# Patient Record
Sex: Female | Born: 1940 | Race: Black or African American | Hispanic: No | Smoking: Former smoker
Health system: Southern US, Community
[De-identification: ages and names within clinical notes are randomized; demographics above are authoritative.]

## PROBLEM LIST (undated history)

## (undated) DIAGNOSIS — M069 Rheumatoid arthritis, unspecified: Secondary | ICD-10-CM

## (undated) DIAGNOSIS — M1990 Unspecified osteoarthritis, unspecified site: Secondary | ICD-10-CM

## (undated) DIAGNOSIS — E039 Hypothyroidism, unspecified: Secondary | ICD-10-CM

## (undated) DIAGNOSIS — C541 Malignant neoplasm of endometrium: Secondary | ICD-10-CM

## (undated) DIAGNOSIS — I214 Non-ST elevation (NSTEMI) myocardial infarction: Secondary | ICD-10-CM

## (undated) DIAGNOSIS — I10 Essential (primary) hypertension: Secondary | ICD-10-CM

## (undated) DIAGNOSIS — E079 Disorder of thyroid, unspecified: Secondary | ICD-10-CM

## (undated) HISTORY — DX: Non-ST elevation (NSTEMI) myocardial infarction: I21.4

## (undated) HISTORY — DX: Rheumatoid arthritis, unspecified: M06.9

## (undated) HISTORY — DX: Hypothyroidism, unspecified: E03.9

## (undated) HISTORY — DX: Malignant neoplasm of endometrium: C54.1

---

## 2004-10-19 ENCOUNTER — Ambulatory Visit: Payer: Self-pay | Admitting: Rheumatology

## 2005-06-01 ENCOUNTER — Ambulatory Visit: Payer: Self-pay | Admitting: Family Medicine

## 2006-03-13 ENCOUNTER — Ambulatory Visit: Payer: Self-pay

## 2006-06-07 ENCOUNTER — Ambulatory Visit: Payer: Self-pay

## 2006-12-18 ENCOUNTER — Emergency Department: Payer: Self-pay | Admitting: Emergency Medicine

## 2007-07-05 ENCOUNTER — Ambulatory Visit: Payer: Self-pay | Admitting: Family Medicine

## 2008-08-12 ENCOUNTER — Ambulatory Visit: Payer: Self-pay | Admitting: Family Medicine

## 2009-01-29 ENCOUNTER — Ambulatory Visit: Payer: Self-pay | Admitting: Family Medicine

## 2009-12-16 ENCOUNTER — Ambulatory Visit: Payer: Self-pay | Admitting: Ophthalmology

## 2010-02-03 ENCOUNTER — Ambulatory Visit: Payer: Self-pay | Admitting: Ophthalmology

## 2010-03-17 ENCOUNTER — Ambulatory Visit: Payer: Self-pay | Admitting: Family Medicine

## 2010-08-04 ENCOUNTER — Ambulatory Visit: Payer: Self-pay | Admitting: Ophthalmology

## 2011-11-03 ENCOUNTER — Ambulatory Visit: Payer: Self-pay | Admitting: Primary Care

## 2012-02-19 ENCOUNTER — Emergency Department: Payer: Self-pay | Admitting: Unknown Physician Specialty

## 2012-02-19 LAB — COMPREHENSIVE METABOLIC PANEL
Alkaline Phosphatase: 95 U/L (ref 50–136)
Anion Gap: 5 — ABNORMAL LOW (ref 7–16)
BUN: 14 mg/dL (ref 7–18)
Co2: 27 mmol/L (ref 21–32)
Creatinine: 0.75 mg/dL (ref 0.60–1.30)
EGFR (African American): >60
EGFR (Non-African Amer.): >60
Glucose: 71 mg/dL (ref 65–99)
Potassium: 3.4 mmol/L — ABNORMAL LOW (ref 3.5–5.1)
SGOT(AST): 20 U/L (ref 15–37)
SGPT (ALT): 25 U/L (ref 12–78)
Sodium: 138 mmol/L (ref 136–145)

## 2012-02-19 LAB — TROPONIN I: Troponin-I: <0.02 ng/mL

## 2012-02-19 LAB — PRO B NATRIURETIC PEPTIDE: B-Type Natriuretic Peptide: 77 pg/mL (ref 0–125)

## 2012-02-19 LAB — CBC
HCT: 41 % (ref 35.0–47.0)
Platelet: 266 10*3/uL (ref 150–440)
RBC: 4.24 10*6/uL (ref 3.80–5.20)

## 2012-02-29 ENCOUNTER — Emergency Department: Payer: Self-pay | Admitting: Emergency Medicine

## 2012-02-29 LAB — BASIC METABOLIC PANEL
Anion Gap: 2 — ABNORMAL LOW (ref 7–16)
Chloride: 103 mmol/L (ref 98–107)
Co2: 33 mmol/L — ABNORMAL HIGH (ref 21–32)
Creatinine: 1.01 mg/dL (ref 0.60–1.30)
EGFR (Non-African Amer.): 56 — ABNORMAL LOW
Osmolality: 278 (ref 275–301)
Potassium: 3.5 mmol/L (ref 3.5–5.1)

## 2012-02-29 LAB — CBC
HGB: 13.4 g/dL (ref 12.0–16.0)
MCH: 32.8 pg (ref 26.0–34.0)
MCV: 97 fL (ref 80–100)
RDW: 13.9 % (ref 11.5–14.5)

## 2012-02-29 LAB — CK TOTAL AND CKMB (NOT AT ARMC)
CK, Total: 134 U/L (ref 21–215)
CK-MB: 1.4 ng/mL (ref 0.5–3.6)

## 2012-03-06 LAB — TROPONIN I: Troponin-I: <0.02 ng/mL

## 2012-03-06 LAB — CBC
HGB: 13.9 g/dL (ref 12.0–16.0)
Platelet: 276 10*3/uL (ref 150–440)
RBC: 4.2 10*6/uL (ref 3.80–5.20)
WBC: 7.3 10*3/uL (ref 3.6–11.0)

## 2012-03-06 LAB — CK TOTAL AND CKMB (NOT AT ARMC): CK-MB: 2.1 ng/mL (ref 0.5–3.6)

## 2012-03-06 LAB — BASIC METABOLIC PANEL
Anion Gap: 5 — ABNORMAL LOW (ref 7–16)
Calcium, Total: 9.8 mg/dL (ref 8.5–10.1)
Chloride: 103 mmol/L (ref 98–107)
Co2: 30 mmol/L (ref 21–32)
Glucose: 87 mg/dL (ref 65–99)
Osmolality: 277 (ref 275–301)
Sodium: 138 mmol/L (ref 136–145)

## 2012-03-07 ENCOUNTER — Observation Stay: Payer: Self-pay | Admitting: Internal Medicine

## 2012-03-07 LAB — TROPONIN I
Troponin-I: <0.02 ng/mL
Troponin-I: <0.02 ng/mL

## 2012-03-07 LAB — CK-MB: CK-MB: 1 ng/mL (ref 0.5–3.6)

## 2012-11-21 ENCOUNTER — Ambulatory Visit: Payer: Self-pay | Admitting: Primary Care

## 2013-05-02 ENCOUNTER — Ambulatory Visit: Payer: Self-pay | Admitting: Ophthalmology

## 2013-05-02 LAB — POTASSIUM: Potassium: 3.5 mmol/L (ref 3.5–5.1)

## 2013-05-15 ENCOUNTER — Ambulatory Visit: Payer: Self-pay | Admitting: Ophthalmology

## 2013-08-14 DIAGNOSIS — M1990 Unspecified osteoarthritis, unspecified site: Secondary | ICD-10-CM | POA: Insufficient documentation

## 2014-05-16 NOTE — Discharge Summary (Signed)
PATIENT NAME:  Kimberly Burke, Kimberly Burke MR#:  [id number] DATE OF BIRTH:  [birth year]  DATE OF ADMISSION:  03/07/2012 DATE OF DISCHARGE:  03/07/2012  PRIMARY CARE PHYSICIAN: Emmaline Kluver., MD [facility] in [place].  DISCHARGE DIAGNOSES: Chest pain, mostly muscular, rheumatoid arthritis, hypertension and hypothyroidism.   HISTORY OF PRESENT ILLNESS: The patient is a 74 year old female with past medical history of rheumatoid arthritis, hypothyroidism, hypertension, who presented with complaint of chest pain. The patient reported that chest pain has been going on for a few weeks now in non-typical quality; already presented to the Emergency Room 2 times in the past with similar complaint. The patient reported that her pain is intermittent; waxing and waning and started when she was ironing some clothes in the left lower breast area, started from back and radiated to the front; sharp quality; worsened by deep inspiration and movement and by palpation. The patient presented to the Emergency Room on 01/26 also with significant pain where she was negative troponin. No EKG changes. She presented on 02/05 also. She had elevated d-dimer. CT chest with IV contrast was done, which was negative and did show right basilar air space disease, which might be either infiltrative. She was discharged on p.o. antibiotics from the Emergency Department. This time when the patient came to the Emergency Room, she had the pain, which improved with morphine. She was given aspirin 324 mg and she was admitted to rule out any coronary event. She did not have any other complaint with the pain.   HOSPITAL COURSE AND STAY: Troponins were followed x 3 and they were negative, less than 0.02. Echocardiogram was done, which was normal. LVF, mild MR and mild TR. Her pain remained under control with pain medication and some muscle relaxants. She was feeling better, so she was discharged home with advice to follow up with primary care  physician, and if the pain persists, then she might need to see a cardiologist. Other medical issues addressed during the hospital stay:  1.  Hypertension. We continued on home medications and blood pressure remained stable.  2.  Hypothyroidism. We continued on Synthroid.  3.  Rheumatoid arthritis. Continued to follow with her outpatient hematologist.  4.  Deep vein thrombosis prophylaxis with subcutaneous Heparin.   IMPORTANT LABORATORY RESULTS: Grossly negative. Troponin x 3 negative. Echocardiogram as discussed above, left ventricular function normal.   CONDITION ON DISCHARGE: Stable.   CODE STATUS: Full code.   DISCHARGE MEDICATIONS: Atenolol 100 mg oral tablet once a day, hydrochlorothiazide 25 mg once a day, Remicade 100 mg intravenous injection once every 3 months, levothyroxine 50 mcg oral tablet once a day, folic acid 1 mg oral tablet once a day, methotrexate 2.5 mg oral tablet 3 orally once a week, cyclobenzaprine 10 mg oral tablet 2 times a day for 15 days as needed for muscle spasm, meclizine 250 mg oral tablet 2 times a day for 15 days and pantoprazole 40 mg oral delayed-release tablet once a day.   HOME OXYGEN: No.  DIET ON DISCHARGE: Low sodium, low fat, low cholesterol diet. Consistency regular.  ACTIVITY LIMITATION: None.   TIME FRAME TO FOLLOWUP: Within 1 to 2 weeks with primary care physician.  TOTAL TIME SPENT ON THIS DISCHARGE: 45 minutes.    ____________________________ Ceasar Lund Anselm Jungling, MD vgv:aw D: 03/11/2012 14:00:51 ET T: 03/12/2012 08:45:55 ET JOB#: [id number]  cc: Ceasar Lund. Anselm Jungling, MD, <Dictator> Emmaline Kluver., MD Vaughan Basta MD ELECTRONICALLY SIGNED 03/16/2012 18:19

## 2014-05-16 NOTE — H&P (Signed)
PATIENT NAME:  Kimberly Burke, RECENDIZ MR#:  [id number] DATE OF BIRTH:  [birth year]  DATE OF ADMISSION:  03/07/2012  REFERRING PHYSICIAN: Dr. Marta Antu.   PRIMARY CARE PHYSICIAN: [facility].   PRIMARY RHEUMATOLOGIST: Dr. Cristi Loron.   CHIEF COMPLAINT: Chest pain.   HISTORY OF PRESENT ILLNESS: This is a 74 year old female with past medical history of rheumatoid arthritis, hypothyroidism, hypertension, presents with complaints of chest pain. The patient reports chest pain has been going on for a few weeks, nontypical quality. Already presented to the ED 2 times with similar complaints. The patient reports her chest pain is intermittent, waxing and waning, started today while she was ironing some clothes. Reports it is in the left lower breast area. It starts from the back and radiates to the front, sharp quality, worsened by deep inspiration and movement, as well as by palpation. Reports this is typical for the chest pain when she presented over the last 2 weeks to the ED. The patient presented on January 26th with similar pain where she had negative troponins, no EKG changes. As well, presented February 5th, before six days, where she had elevated D-dimers, where she had CT chest with IV contrast which came out negative for PE, but it did show right basilar airspace disease which might represent atelectasis versus developing infiltrate, where she was discharged on p.o. antibiotics from the ED. The patient reports her chest pain improved today when she was given morphine. The patient was given 324 of aspirin in the ED. Hospitalists were requested to admit the patient for further workup of this chest pain. The patient, as mentioned earlier, describes the quality of the pain as sharp without any radiation. She denies any nausea, vomiting, sweating. Denies any shortness of breath, palpitations, weakness, dizziness or cough.   ALLERGIES: CODEINE CAUSING RASH.   HOME MEDICATIONS:  1.  Hydrochlorothiazide 25 oral daily.  2. Levothyroxine 50 mcg oral daily.  3. Atenolol 100 mg oral daily.  4. Methotrexate sodium 2.5 mg 3 tablets every Wednesday.  5. Remicade 100 mg IV infusion every 8 weeks. Last received on January 12, 2012.  6. Folic acid 1 mg oral daily.  7. Flexeril as needed.   PAST MEDICAL HISTORY:  1. Hypertension.  2. Kidney stone.  3. Migraines.  4. Rheumatid arthritis.  5. Hypothyroidism.  6. History of hepatic and renal cysts.   PAST SURGICAL HISTORY: None.   SOCIAL HISTORY: The patient drinks alcohol occasionally. No history of smoking or illicit drug use.   FAMILY HISTORY: Significant for hypertension, CVA, diabetes in the family and her sister had history of brain tumor.   REVIEW OF SYSTEMS:  GENERAL: The patient denies any fever, chills, fatigue or weakness.  EYES: Denies blurry vision, double vision, pain, inflammation, glaucoma, cataracts.  EARS, NOSE, THROAT: Denies tinnitus, ear pain, hearing loss, allergies, discharge, snoring, epistaxis or postnasal drip.  RESPIRATORY: Denies any cough, wheezing, hemoptysis, dyspnea. Has painful deep inspiration on the left side. Denies any COPD.   CARDIOVASCULAR: Has nontypical chest pain left-sided, reproducible by palpation, deep inspiration and movement. Denies any edema, arrhythmia, palpitations, syncope, dyspnea on exertion.  GASTROINTESTINAL: Denies any nausea, vomiting, diarrhea, abdominal pain, hematemesis, melena, rectal bleed, hemorrhoids, constipation or diarrhea. Complains of indigestion.  GENITOURINARY: Denies dysuria, hematuria, renal colic.  ENDOCRINE: Denies polyuria, polydipsia, heat or cold intolerance. Has hypothyroidism.  HEMATOLOGY: Denies anemia, easy bruising, bleeding diathesis.  INTEGUMENTARY: Denies acne, rash or skin lesions.  MUSCULOSKELETAL: Denies any cramps, gout, limited activity or redness.  NEUROLOGIC: Denies dysarthria, epilepsy, tremors, vertigo, ataxia, dementia, headache,  migraine, CVA, TIA.   PSYCHIATRIC: Denies anxiety, insomnia, bipolar disorder, depression, substance or alcohol abuse.   PHYSICAL EXAM:  VITAL SIGNS: Temperature 97.5, pulse 61, respiratory rate 20, blood pressure 126/64, saturating 97% on oxygen.  GENERAL: Well-nourished female who looks comfortable in bed, in no apparent distress.  HEAD, EYES, EARS, NOSE, THROAT: Head atraumatic, normocephalic. Pupils equal, reactive to light. Pink conjunctivae. Anicteric sclerae. Moist oral mucosa.  NECK: Supple. No thyromegaly. No JVD.  CHEST: Has good air entry bilaterally. No wheezing, rales or rhonchi. Has reproducible chest pain to palpation in the rib cage area and the left lower chest area.  CARDIOVASCULAR: S1, S2 heard. No rubs, murmurs or gallops.  ABDOMEN: Soft, nontender, nondistended. Bowel sounds present.  EXTREMITIES: No edema. No clubbing. No cyanosis. Has some distal hypertrophic changes.  NEUROLOGIC: Cranial nerves grossly intact. Motor 5 out of 5.  SKIN: Normal skin turgor. Warm and dry.  PSYCHIATRIC: Pleasant, awake, alert x3. Intact judgment and insight.   PERTINENT LABS: Glucose 87, BUN 19, creatinine 0.92, sodium 138, potassium 3.8, chloride 103, CO2 30. Troponin less than 0.02. White blood cells 7.3, hemoglobin 13.9, hematocrit 41, platelets 276. EKG showing normal sinus rhythm with poor progression of R wave from V2 to V3, but this is old when compared to the previous EKGs in early February and January.   ASSESSMENT AND PLAN:  1. Chest pain: It is nontypical chest pain. This is most likely musculoskeletal costochondritis as it is reproducible by palpation, worsened with deep inspiratory effort and with movement. The patient will be started on naproxen as well as Protonix for gastrointestinal prophylaxis, as well as on p.r.n. morphine as her pain is significant. Meanwhile, she will be admitted with telemonitor. Will continue to cycle her troponins to rule out any cardiac origin of her  chest pain or any ischemia even though it seems unlikely. Given the patient (Dictation Anomaly) <MISSING TEXT>> , will check a 2D echocardiogram to rule out any pericardial effusion or pericarditis. The patient had a pulmonary embolism ruled out last week when she had CT chest with intravenous contrast which did not show any evidence for pulmonary embolism.  2. Hypertension: Will continue the patient on her home medication.  3. Hypothyroidism: Continue with Synthroid.  4. Rheumatoid arthritis: Seems to be stable at this point. The patient to continue to follow with her outpatient rheumatologist.  5. Deep vein thrombosis prophylaxis: Subcutaneous heparin.  6. Gastrointestinal prophylaxis: On Protonix.   CODE STATUS: Full code.   TOTAL TIME SPENT ON ADMISSION AND PATIENT CARE: 55 minutes.    ____________________________ Albertine Patricia, MD dse:gb D: 03/07/2012 01:37:01 ET T: 03/07/2012 01:58:28 ET JOB#: [id number]  cc: Albertine Patricia, MD, <Dictator> Corrine Tillis Graciela Husbands MD ELECTRONICALLY SIGNED 03/10/2012 4:56

## 2014-05-17 NOTE — Op Note (Signed)
PATIENT NAME:  Kimberly Burke, Kimberly Burke MR#:  [id number] DATE OF BIRTH:  [birth year] ACCOUNT #: [id number]  DATE OF PROCEDURE:  05/15/2013  PROCEDURES PERFORMED:   1. Pars plana vitrectomy of the right eye.  2. Internal limiting membrane peel of the right eye.  3. Endolaser.  PREOPERATIVE DIAGNOSIS: Full-thickness macular hole.   POSTOPERATIVE DIAGNOSIS: Full-thickness macular hole.   ESTIMATED BLOOD LOSS: Less than 1 mL.  PRIMARY SURGEON: Kimberly Burke. Kimberly Nettle, MD  ANESTHESIA: Retrobulbar block of the right eye with monitored anesthesia care.   COMPLICATIONS: None.   INDICATIONS FOR PROCEDURE: The patient presented to my office with decreased vision in the right eye. Examination revealed a full-thickness macular hole. Risks, benefits and alternatives of the above procedure were discussed, and the patient wished to proceed.   DETAILS: After informed consent was obtained, the patient was brought to the operative suite at [facility]. The patient was placed in supine position and was given a small dose of Alfenta. A retrobulbar block was performed on the right eye by the primary surgeon without any complications. The right eye was prepped and draped in sterile manner. After lid speculum was inserted, a 25-gauge trocar was placed inferotemporally through displaced conjunctiva in an oblique fashion 3 mm beyond the limbus. Infusion cannula was turned on and inserted through the trocar and secured into position with Steri-Strips. Two more trocars were placed in a similar fashion superotemporally and superonasally. The vitreous cutter and light pipe were introduced into the eye, and a core vitrectomy was performed. The vitreous face was engaged using suction and elevated away from the retina without any complications. Vitreous face was removed, and the peripheral vitreous was trimmed for 360 degrees. Indocyanine green was injected onto the posterior pole and removed within 30 seconds. A  complete internal limiting membrane peel was performed for 360 degrees around the fovea without complication. Total diameter of approximately 2 disk diameters. Scleral depressed exam was performed, and there were multiple areas of lattice with atrophic holes inferiorly. Endolaser was introduced, and 4 rows of laser were placed around each of these areas. No other signs of any breaks, tears or retinal detachment could be identified for 360 degrees. A complete air-fluid exchange was performed. Four minutes was allowed to elapse for dehydration of the vitreous base, and this fluid was removed. The 20% SF6 was used as an Acupuncturist, and the trocars were removed. The wounds were noted to be airtight, and pressure in the eye was confirmed to be approximately 15 mmHg. Dexamethasone 5 mg was given into the inferior fornix. The lid speculum was removed, and the eye was cleaned. TobraDex was placed on the eye, and a patch and shield were placed over the eye. The patient was taken to postanesthesia care with instruction to remain face down.    ____________________________ Kimberly Burke. Kimberly Manns, MD mfa:lb D: 05/15/2013 08:26:10 ET T: 05/15/2013 08:40:30 ET JOB#: [id number]  cc: Kimberly Burke. Kimberly Manns, MD, <Dictator> Coralee Rud MD ELECTRONICALLY SIGNED 06/19/2013 9:32

## 2014-05-21 IMAGING — CR DG CHEST 2V
1 series · 2 of 2 positions shown · non-contrast
Comparison: none

REASON FOR EXAM: sob with thoracic pain
COMMENTS:

[Series 2: w chest pa · 0.14mm/px · 2 of 2 slices shown]
[im 1/2]
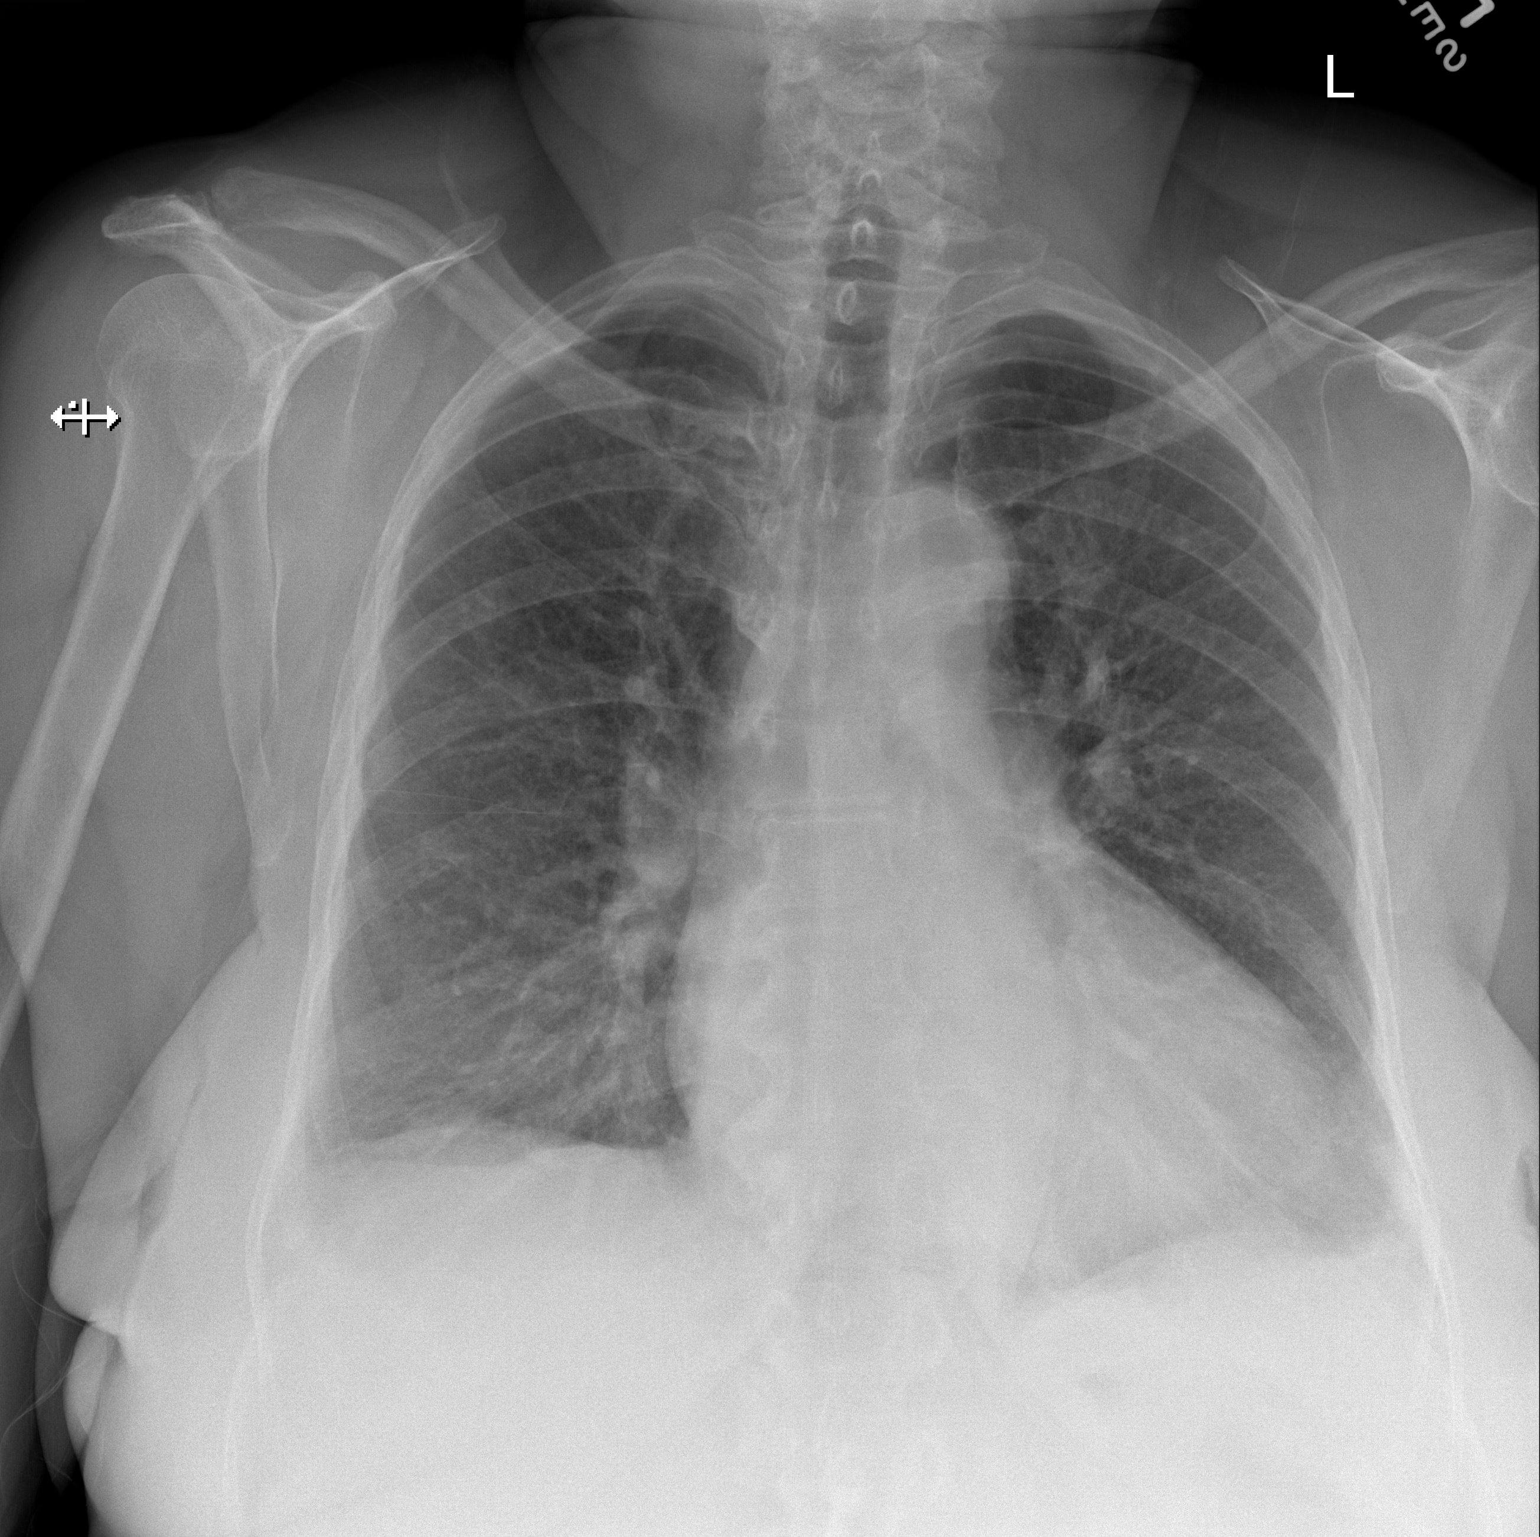
[im 2/2]
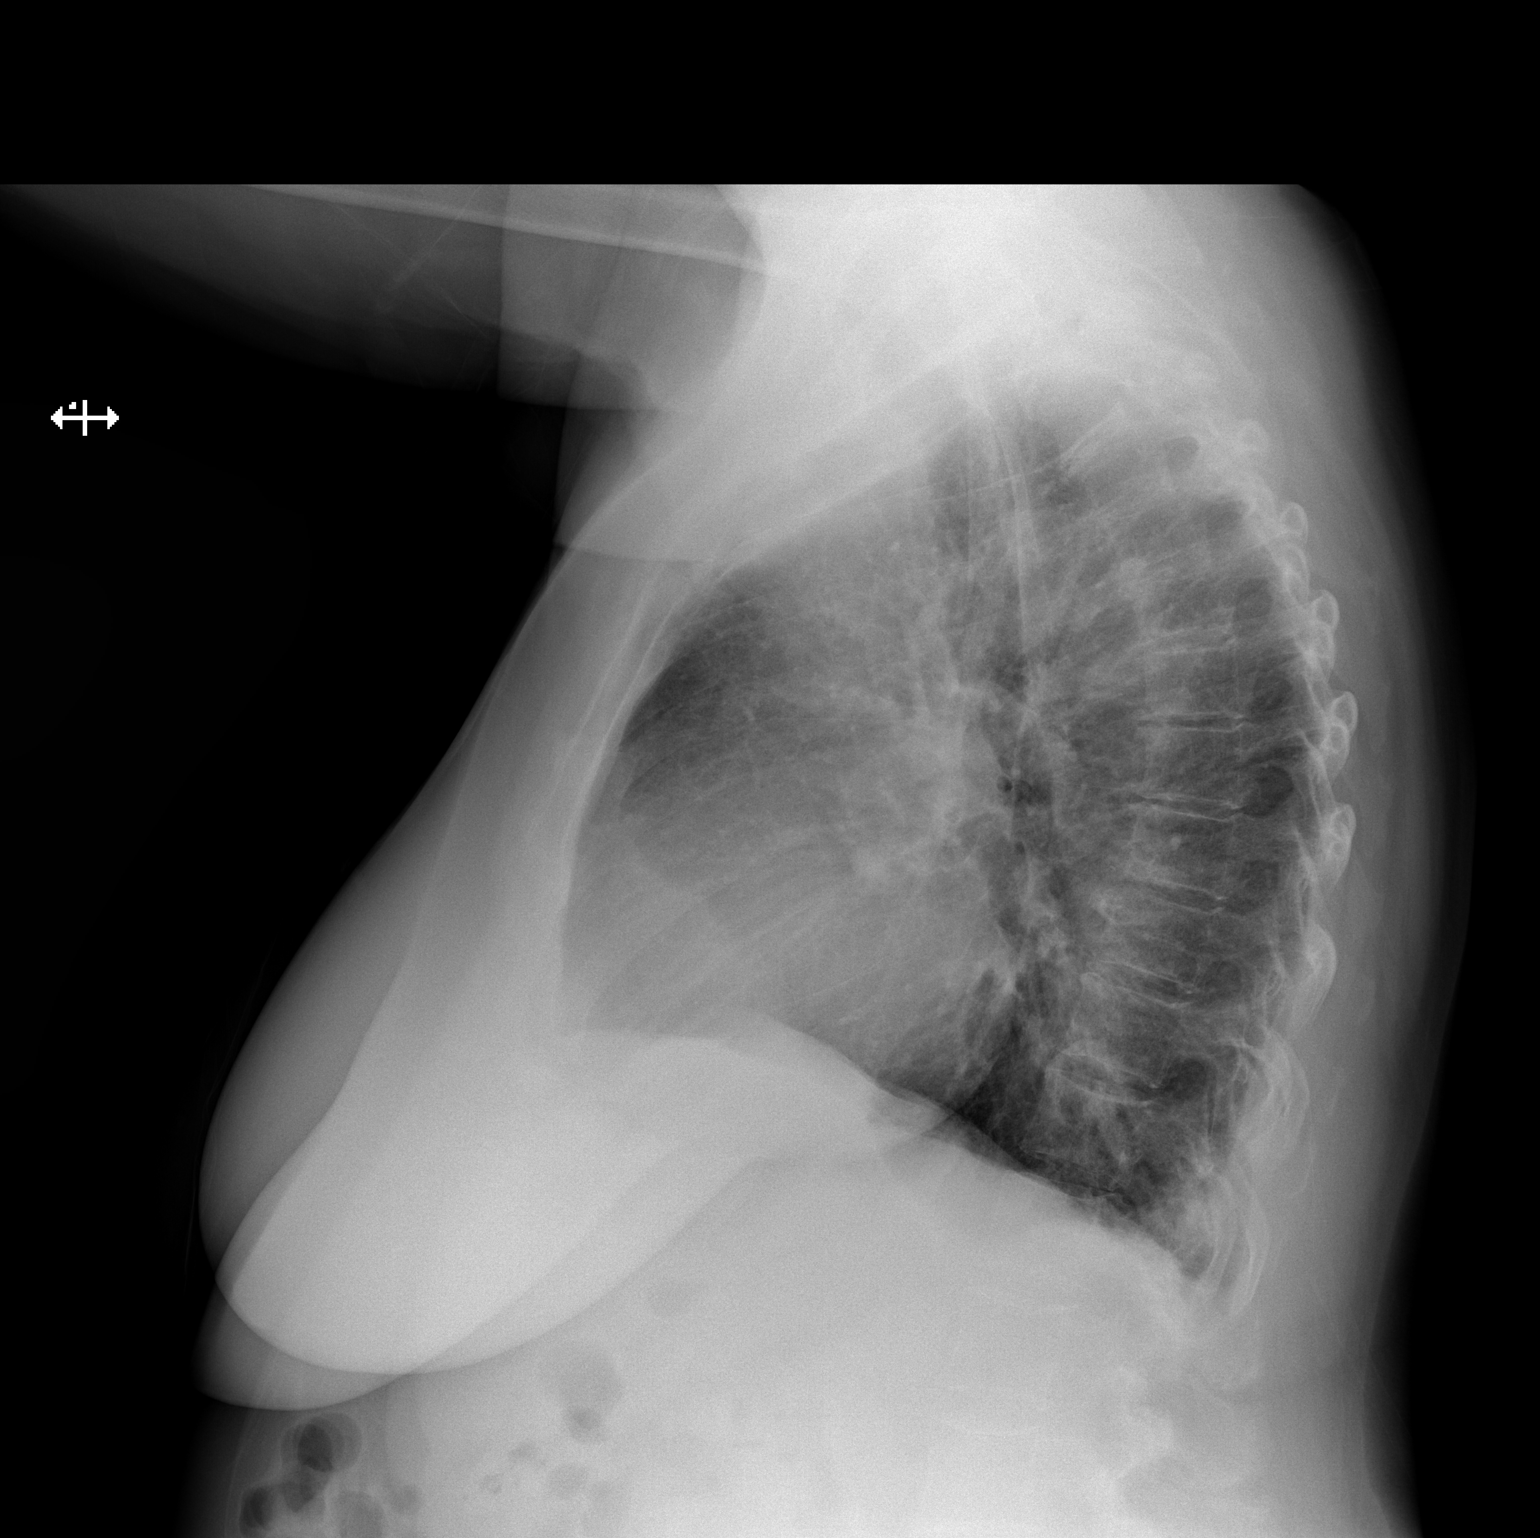

[2 of 2 positions shown; findings below may reference images not displayed]

PROCEDURE:     DXR - DXR CHEST PA (OR AP) AND LATERAL  - February 19, 2012  [DATE]

RESULT:     There is no previous exam for comparison.

There is some increased density in the retrocardiac region of the left lower
lobe suggestive of atelectasis or early infiltrate. The heart is borderline
enlarged. There is no edema, effusion or pneumothorax.
IMPRESSION: Minimal retrocardiac left lung base atelectasis versus
early infiltrate. Borderline cardiomegaly.

[REDACTED]

## 2014-10-27 ENCOUNTER — Other Ambulatory Visit: Payer: Self-pay | Admitting: Physician Assistant

## 2014-10-27 DIAGNOSIS — Z1231 Encounter for screening mammogram for malignant neoplasm of breast: Secondary | ICD-10-CM

## 2014-11-06 ENCOUNTER — Ambulatory Visit: Payer: Self-pay | Attending: Physician Assistant

## 2015-05-22 ENCOUNTER — Other Ambulatory Visit: Payer: Self-pay | Admitting: Physician Assistant

## 2015-05-22 DIAGNOSIS — I8393 Asymptomatic varicose veins of bilateral lower extremities: Secondary | ICD-10-CM

## 2015-06-03 ENCOUNTER — Ambulatory Visit
Admission: RE | Admit: 2015-06-03 | Discharge: 2015-06-03 | Disposition: A | Discharge status: Home / Self Care | Payer: Medicare Other | Source: Ambulatory Visit | Attending: Physician Assistant | Admitting: Physician Assistant

## 2015-06-03 ENCOUNTER — Other Ambulatory Visit: Payer: Self-pay | Admitting: Physician Assistant

## 2015-06-03 DIAGNOSIS — M8580 Other specified disorders of bone density and structure, unspecified site: Secondary | ICD-10-CM | POA: Insufficient documentation

## 2015-06-03 DIAGNOSIS — Z139 Encounter for screening, unspecified: Secondary | ICD-10-CM

## 2015-06-03 DIAGNOSIS — Z1231 Encounter for screening mammogram for malignant neoplasm of breast: Secondary | ICD-10-CM | POA: Diagnosis present

## 2015-06-03 DIAGNOSIS — I8393 Asymptomatic varicose veins of bilateral lower extremities: Secondary | ICD-10-CM

## 2015-06-03 DIAGNOSIS — Z13820 Encounter for screening for osteoporosis: Secondary | ICD-10-CM | POA: Diagnosis present

## 2016-11-28 ENCOUNTER — Other Ambulatory Visit: Payer: Self-pay | Admitting: Physician Assistant

## 2016-11-28 DIAGNOSIS — Z1231 Encounter for screening mammogram for malignant neoplasm of breast: Secondary | ICD-10-CM

## 2016-12-13 ENCOUNTER — Ambulatory Visit
Admission: RE | Admit: 2016-12-13 | Discharge: 2016-12-13 | Disposition: A | Discharge status: Home / Self Care | Payer: Medicare Other | Source: Ambulatory Visit | Attending: Physician Assistant | Admitting: Physician Assistant

## 2016-12-13 DIAGNOSIS — Z1231 Encounter for screening mammogram for malignant neoplasm of breast: Secondary | ICD-10-CM | POA: Insufficient documentation

## 2017-07-02 ENCOUNTER — Emergency Department: Payer: Medicare Other

## 2017-07-02 ENCOUNTER — Emergency Department
Admission: EM | Admit: 2017-07-02 | Discharge: 2017-07-03 | Disposition: A | Discharge status: Home / Self Care | Payer: Medicare Other | Attending: Emergency Medicine | Admitting: Emergency Medicine

## 2017-07-02 ENCOUNTER — Other Ambulatory Visit: Payer: Self-pay

## 2017-07-02 DIAGNOSIS — I129 Hypertensive chronic kidney disease with stage 1 through stage 4 chronic kidney disease, or unspecified chronic kidney disease: Secondary | ICD-10-CM | POA: Insufficient documentation

## 2017-07-02 DIAGNOSIS — Z79899 Other long term (current) drug therapy: Secondary | ICD-10-CM | POA: Insufficient documentation

## 2017-07-02 DIAGNOSIS — N189 Chronic kidney disease, unspecified: Secondary | ICD-10-CM | POA: Diagnosis not present

## 2017-07-02 DIAGNOSIS — R609 Edema, unspecified: Secondary | ICD-10-CM | POA: Insufficient documentation

## 2017-07-02 DIAGNOSIS — R0989 Other specified symptoms and signs involving the circulatory and respiratory systems: Secondary | ICD-10-CM | POA: Diagnosis not present

## 2017-07-02 DIAGNOSIS — R079 Chest pain, unspecified: Secondary | ICD-10-CM | POA: Diagnosis not present

## 2017-07-02 LAB — CBC
HEMATOCRIT: 39.3 % (ref 35.0–47.0)
Hemoglobin: 13.5 g/dL (ref 12.0–16.0)
MCH: 33.1 pg (ref 26.0–34.0)
MCHC: 34.4 g/dL (ref 32.0–36.0)
MCV: 96.3 fL (ref 80.0–100.0)
Platelets: 259 10*3/uL (ref 150–440)
RBC: 4.08 MIL/uL (ref 3.80–5.20)
RDW: 14.5 % (ref 11.5–14.5)
WBC: 8.3 10*3/uL (ref 3.6–11.0)

## 2017-07-02 LAB — BASIC METABOLIC PANEL
Anion gap: 8 (ref 5–15)
BUN: 16 mg/dL (ref 6–20)
CHLORIDE: 99 mmol/L — AB (ref 101–111)
CO2: 29 mmol/L (ref 22–32)
Calcium: 10.1 mg/dL (ref 8.9–10.3)
Creatinine, Ser: 0.86 mg/dL (ref 0.44–1.00)
GFR calc Af Amer: >60 mL/min (ref >60)
GFR calc non Af Amer: >60 mL/min (ref >60)
GLUCOSE: 98 mg/dL (ref 65–99)
POTASSIUM: 3.1 mmol/L — AB (ref 3.5–5.1)
Sodium: 136 mmol/L (ref 135–145)

## 2017-07-02 LAB — TROPONIN I: Troponin I: <0.03 ng/mL (ref <0.03)

## 2017-07-02 LAB — BRAIN NATRIURETIC PEPTIDE: B Natriuretic Peptide: 52 pg/mL (ref 0.0–100.0)

## 2017-07-02 MED ORDER — POTASSIUM CHLORIDE CRYS ER 20 MEQ PO TBCR
40.0000 meq | EXTENDED_RELEASE_TABLET | Freq: Once | ORAL | Status: AC
  Administered 2017-07-02: 40 meq via ORAL
  Filled 2017-07-02: qty 2

## 2017-07-02 MED ORDER — ASPIRIN 81 MG PO CHEW
324.0000 mg | CHEWABLE_TABLET | Freq: Once | ORAL | Status: AC
  Administered 2017-07-02: 324 mg via ORAL
  Filled 2017-07-02: qty 4

## 2017-07-02 MED ORDER — FUROSEMIDE 10 MG/ML IJ SOLN
20.0000 mg | Freq: Once | INTRAMUSCULAR | Status: AC
  Administered 2017-07-02: 20 mg via INTRAVENOUS
  Filled 2017-07-02: qty 4

## 2017-07-02 NOTE — ED Triage Notes (Signed)
Patient reports "past couple days" had pain to left axilla area and under left breast "feels like a bubble".  Today pain causing shortness of breath.

## 2017-07-02 NOTE — ED Provider Notes (Signed)
[facility] Emergency Department Provider Note   ____________________________________________   First MD Initiated Contact with Patient 07/02/17 2308     (approximate)  I have reviewed the triage vital signs and the nursing notes.   HISTORY  Chief Complaint Chest Pain    HPI Kimberly Burke is a 77 y.o. female who presents to the ED from home with a chief complaint of chest pain.  Patient reports a 3-day history of intermittent pain under her left breast and left axilla.  States it "feels like a bubble".  Occasionally associated with shortness of breath.  Denies associated diaphoresis, dyspnea on exertion, nausea/vomiting, palpitations or dizziness.  States it hurts when she presses on it.  Recent fever, chills, cough, congestion, abdominal pain, dysuria, diarrhea.  Denies recent travel, trauma or hormone use.   Past medical history Hypertension    Chronic kidney disease    Thyroid disease    Venous stasis    Rheumatoid arthritis ([place]) 08/14/2013 a. Methotrexate b. S/p Humira c. Remicade, stopped because of cost. d. Plaquenil.   Degenerative arthritis ([place]) 08/14/2013 a. 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    Prior to Admission medications   Medication Sig Start Date End Date Taking? Authorizing Provider  acetaminophen (TYLENOL) 650 MG CR tablet Take 1,000 mg by mouth daily.   Yes [provider]  atenolol (TENORMIN) 100 MG tablet Take 1 tablet by mouth daily. 05/09/17  Yes [provider]  hydrochlorothiazide (HYDRODIURIL) 25 MG tablet Take 1 tablet by mouth daily. 05/10/17  Yes [provider]  hydroxychloroquine (PLAQUENIL) 200 MG tablet Take 2 tablets by mouth daily.  06/06/17  Yes [provider]  levothyroxine (SYNTHROID, LEVOTHROID) 50 MCG tablet Take 1 tablet by mouth daily. 05/10/17  Yes [provider]   Methotrexate Sodium (METHOTREXATE, PF,) 50 MG/2ML injection Inject 15 mg into the muscle once a week. 0.6 ml injection 06/15/17  Yes [provider]  fluticasone (FLONASE) 50 MCG/ACT nasal spray Place 1 spray into both nostrils daily. 05/10/17   [provider]    Allergies Codeine and Penicillins  Family History  Problem Relation Age of Onset  . Cancer Brother        prostate or colon unsure which   . Breast cancer Sister 32    Social History Social History   Tobacco Use  . Smoking status: Not on file  Substance Use Topics  . Alcohol use: Not on file  . Drug use: Not on file  Non-smoker  Review of Systems  Constitutional: No fever/chills Eyes: No visual changes. ENT: No sore throat. Cardiovascular: Positive for chest pain. Respiratory: Denies shortness of breath. Gastrointestinal: No abdominal pain.  No nausea, no vomiting.  No diarrhea.  No constipation. Genitourinary: Negative for dysuria. Musculoskeletal: Negative for back pain. Skin: Negative for rash. Neurological: Negative for headaches, focal weakness or numbness.   ____________________________________________   PHYSICAL EXAM:  VITAL SIGNS: ED Triage Vitals  Enc Vitals Group     BP 07/02/17 2035 (!) 155/79     Pulse Rate 07/02/17 2035 64     Resp 07/02/17 2035 (!) 22     Temp 07/02/17 2035 98 F (36.7 C)     Temp Source 07/02/17 2035 Oral     SpO2 07/02/17 2035 97 %     Weight 07/02/17 2033 175 lb (79.4 kg)     Height 07/02/17  2033 5\' 2"  (1.575 m)     Head Circumference --      Peak Flow --      Pain Score 07/02/17 2033 10     Pain Loc --      Pain Edu? --      Excl. in [place]? --     Constitutional: Alert and oriented. Well appearing and in no acute distress. Eyes: Conjunctivae are normal. PERRL. EOMI. Head: Atraumatic. Nose: No congestion/rhinnorhea. Mouth/Throat: Mucous membranes are moist.  Oropharynx non-erythematous. Neck: No stridor.   Cardiovascular: Normal rate,  regular rhythm. Grossly normal heart sounds.  Good peripheral circulation. Respiratory: Normal respiratory effort.  No retractions. Lungs CTAB. Left lateral ribs tender to palpation. No crepitus. No splinting. Gastrointestinal: Soft and nontender. No distention. No abdominal bruits. No CVA tenderness. Musculoskeletal: No lower extremity tenderness. 1+ nonpitting BLE edema.  No joint effusions. Neurologic:  Normal speech and language. No gross focal neurologic deficits are appreciated. No gait instability. Skin:  Skin is warm, dry and intact. No rash noted. Psychiatric: Mood and affect are normal. Speech and behavior are normal.  ____________________________________________   LABS (all labs ordered are listed, but only abnormal results are displayed)  Labs Reviewed  BASIC METABOLIC PANEL - Abnormal; Notable for the following components:      Result Value   Potassium 3.1 (*)    Chloride 99 (*)    All other components within normal limits  CBC  TROPONIN I  BRAIN NATRIURETIC PEPTIDE  TROPONIN I   ____________________________________________  EKG  ED ECG REPORT I, Eara Burruel J, the attending physician, personally viewed and interpreted this ECG.   Date: 07/02/2017  EKG Time: 2035  Rate: 65  Rhythm: normal EKG, normal sinus rhythm  Axis: Normal  Intervals:none  ST&T Change: Nonspecific ____________________________________________  RADIOLOGY  ED MD interpretation: Mild pulmonary vascular congestion without pulmonary edema  Official radiology report(s): Dg Chest 2 View  Result Date: 07/02/2017 CLINICAL DATA:  Chest pain with shortness of breath EXAM: CHEST - 2 VIEW COMPARISON:  February 29, 2012 FINDINGS: The mediastinal contour is normal. Heart size is enlarged. There is mild pulmonary vascular congestion without frank pulmonary edema. Mild atelectasis of right lung base is noted. There is no pleural effusion. The visualized skeletal structures are stable. IMPRESSION: Mild  pulmonary vascular congestion without frank pulmonary edema. Mild atelectasis of right lung base. Electronically Signed   By: Abelardo Diesel M.D.   On: 07/02/2017 21:29    ____________________________________________   PROCEDURES  Procedure(s) performed: None  Procedures  Critical Care performed: No  ____________________________________________   INITIAL IMPRESSION / ASSESSMENT AND PLAN / ED COURSE  As part of my medical decision making, I reviewed the following data within the electronic MEDICAL RECORD NUMBER History obtained from family, Nursing notes reviewed and incorporated, Labs reviewed, EKG interpreted, Old chart reviewed, Radiograph reviewed and Notes from prior ED visits   77 year old female with rheumatoid arthritis on methotrexate, no prior history of CAD, who presents with reproducible left-sided rib pain and occasional shortness of breath. Differential diagnosis includes, but is not limited to, ACS, aortic dissection, pulmonary embolism, cardiac tamponade, pneumothorax, pneumonia, pericarditis, myocarditis, GI-related causes including esophagitis/gastritis, and musculoskeletal chest wall pain.     Initial troponin unremarkable.  Will replete potassium for mild hypokalemia.  Chest x-ray demonstrates mild pulmonary vascular congestion.  Will administer 20 mg IV Lasix, ambulate patient with pulse ox and reassess.  Clinical Course as of Jul 03 33  Mon Jul 03, 2017  0019 Patient  did well ambulating; room air saturations remained between 98 to 100%.  She was not tachypneic and did not complain of increased chest pain.   [JS]  0034 Updated patient and family member of repeat negative troponin.  She is feeling significantly better and has already urinated twice.  Advised cardiology follow-up, compression stockings.  Strict return precautions given.  Both verbalize understanding and agree with plan of care.   [JS]    Clinical Course User Index [JS] Paulette Blanch, MD      ____________________________________________   FINAL CLINICAL IMPRESSION(S) / ED DIAGNOSES  Final diagnoses:  Nonspecific chest pain  Pulmonary vascular congestion  Peripheral edema     ED Discharge Orders    None       Note:  This document was prepared using Dragon voice recognition software and may include unintentional dictation errors.    Paulette Blanch, MD 07/03/17 [phone]

## 2017-07-03 ENCOUNTER — Emergency Department: Payer: Medicare Other

## 2017-07-03 ENCOUNTER — Emergency Department
Admission: EM | Admit: 2017-07-03 | Discharge: 2017-07-03 | Disposition: A | Discharge status: Home / Self Care | Payer: Medicare Other | Source: Home / Self Care | Attending: Emergency Medicine | Admitting: Emergency Medicine

## 2017-07-03 ENCOUNTER — Other Ambulatory Visit: Payer: Self-pay

## 2017-07-03 ENCOUNTER — Encounter: Payer: Self-pay | Admitting: Emergency Medicine

## 2017-07-03 DIAGNOSIS — I10 Essential (primary) hypertension: Secondary | ICD-10-CM | POA: Insufficient documentation

## 2017-07-03 DIAGNOSIS — R0602 Shortness of breath: Secondary | ICD-10-CM

## 2017-07-03 DIAGNOSIS — Z87891 Personal history of nicotine dependence: Secondary | ICD-10-CM | POA: Insufficient documentation

## 2017-07-03 DIAGNOSIS — M549 Dorsalgia, unspecified: Secondary | ICD-10-CM

## 2017-07-03 DIAGNOSIS — Z79899 Other long term (current) drug therapy: Secondary | ICD-10-CM | POA: Insufficient documentation

## 2017-07-03 HISTORY — DX: Unspecified osteoarthritis, unspecified site: M19.90

## 2017-07-03 HISTORY — DX: Essential (primary) hypertension: I10

## 2017-07-03 LAB — CBC
HEMATOCRIT: 37.6 % (ref 35.0–47.0)
HEMOGLOBIN: 13.2 g/dL (ref 12.0–16.0)
MCH: 33.7 pg (ref 26.0–34.0)
MCHC: 35.2 g/dL (ref 32.0–36.0)
MCV: 95.8 fL (ref 80.0–100.0)
Platelets: 236 10*3/uL (ref 150–440)
RBC: 3.93 MIL/uL (ref 3.80–5.20)
RDW: 14.6 % — ABNORMAL HIGH (ref 11.5–14.5)
WBC: 8.5 10*3/uL (ref 3.6–11.0)

## 2017-07-03 LAB — COMPREHENSIVE METABOLIC PANEL
ALK PHOS: 79 U/L (ref 38–126)
ALT: 52 U/L (ref 14–54)
ANION GAP: 8 (ref 5–15)
AST: 50 U/L — ABNORMAL HIGH (ref 15–41)
Albumin: 4.2 g/dL (ref 3.5–5.0)
BILIRUBIN TOTAL: 0.7 mg/dL (ref 0.3–1.2)
BUN: 14 mg/dL (ref 6–20)
CALCIUM: 10 mg/dL (ref 8.9–10.3)
CO2: 29 mmol/L (ref 22–32)
CREATININE: 0.75 mg/dL (ref 0.44–1.00)
Chloride: 99 mmol/L — ABNORMAL LOW (ref 101–111)
GFR calc Af Amer: >60 mL/min (ref >60)
GFR calc non Af Amer: >60 mL/min (ref >60)
Glucose, Bld: 101 mg/dL — ABNORMAL HIGH (ref 65–99)
Potassium: 3.5 mmol/L (ref 3.5–5.1)
SODIUM: 136 mmol/L (ref 135–145)
TOTAL PROTEIN: 8.2 g/dL — AB (ref 6.5–8.1)

## 2017-07-03 LAB — TROPONIN I
Troponin I: <0.03 ng/mL (ref <0.03)
Troponin I: <0.03 ng/mL (ref <0.03)

## 2017-07-03 LAB — BRAIN NATRIURETIC PEPTIDE: B Natriuretic Peptide: 42 pg/mL (ref 0.0–100.0)

## 2017-07-03 MED ORDER — LIDOCAINE 5 % EX PTCH: 1.0000 | MEDICATED_PATCH | CUTANEOUS | Status: DC

## 2017-07-03 NOTE — ED Notes (Signed)
ED Provider at bedside. 

## 2017-07-03 NOTE — ED Triage Notes (Signed)
Shortness of breath since yesterday. States symptoms worse today. States was seen here yesterday.

## 2017-07-03 NOTE — ED Notes (Signed)
First Nurse Note:  Patient was seen here last PM, returns with same complaint.  Daughter states "they told her she had fluid on her lungs".  Patient alert and oriented, NAD.  Placed in [place].

## 2017-07-03 NOTE — ED Provider Notes (Signed)
[facility] Emergency Department Provider Note   ____________________________________________   I have reviewed the triage vital signs and the nursing notes.   HISTORY  Chief Complaint Back pain  History limited by: Not Limited   HPI Kimberly Burke is a 77 y.o. female who presents to the emergency department today with concerns for left mid back pain.  The patient states that the pain has been present for the past 4 to 5 days.  Is located in her mid left back.  She states this also comes around her left breast.  She states it does feel like a bubble.  She states that it causes some shortness of breath when the pain is worse.  It is worse with movement.  Patient was seen in the emergency department yesterday for similar symptoms with a negative work-up.  She denies any cough.  Has not had any fevers.   Per medical record review patient has a history of HTN, arthritis, on methotrexate.   Past Medical History:  Diagnosis Date  . Arthritis   . Hypertension     There are no active problems to display for this patient.   Prior to Admission medications   Medication Sig Start Date End Date Taking? Authorizing Provider  acetaminophen (TYLENOL) 650 MG CR tablet Take 1,000 mg by mouth daily.    [provider]  atenolol (TENORMIN) 100 MG tablet Take 1 tablet by mouth daily. 05/09/17   [provider]  fluticasone (FLONASE) 50 MCG/ACT nasal spray Place 1 spray into both nostrils daily. 05/10/17   [provider]  hydrochlorothiazide (HYDRODIURIL) 25 MG tablet Take 1 tablet by mouth daily. 05/10/17   [provider]  hydroxychloroquine (PLAQUENIL) 200 MG tablet Take 2 tablets by mouth daily.  06/06/17   [provider]  levothyroxine (SYNTHROID, LEVOTHROID) 50 MCG tablet Take 1 tablet by mouth daily. 05/10/17   [provider]  Methotrexate Sodium (METHOTREXATE, PF,) 50 MG/2ML injection Inject 15 mg into the muscle  once a week. 0.6 ml injection 06/15/17   [provider]    Allergies Codeine and Penicillins  Family History  Problem Relation Age of Onset  . Cancer Brother        prostate or colon unsure which   . Breast cancer Sister 95    Social History Social History   Tobacco Use  . Smoking status: Former Smoker  Substance Use Topics  . Alcohol use: Not on file  . Drug use: Not on file    Review of Systems Constitutional: No fever/chills Eyes: No visual changes. ENT: No sore throat. Cardiovascular: Denies chest pain. Respiratory: Positive for difficulty with deep breath Gastrointestinal: No abdominal pain.  No nausea, no vomiting.  No diarrhea.   Genitourinary: Negative for dysuria. Musculoskeletal: Positive for left back pain. Skin: Negative for rash. Neurological: Negative for headaches, focal weakness or numbness.  ____________________________________________   PHYSICAL EXAM:  VITAL SIGNS: ED Triage Vitals  Enc Vitals Group     BP 07/03/17 0836 139/87     Pulse Rate 07/03/17 0836 62     Resp 07/03/17 0836 18     Temp 07/03/17 0836 98 F (36.7 C)     Temp Source 07/03/17 0836 Oral     SpO2 07/03/17 0836 96 %     Weight 07/03/17 0839 175 lb (79.4 kg)     Height 07/03/17 0839 5\' 2"  (1.575 m)     Head Circumference --      Peak Flow --  Pain Score 07/03/17 0839 8   Constitutional: Alert and oriented.  Eyes: Conjunctivae are normal.  ENT      Head: Normocephalic and atraumatic.      Nose: No congestion/rhinnorhea.      Mouth/Throat: Mucous membranes are moist.      Neck: No stridor. Hematological/Lymphatic/Immunilogical: No cervical lymphadenopathy. Cardiovascular: Normal rate, regular rhythm.  No murmurs, rubs, or gallops.  Respiratory: Normal respiratory effort without tachypnea nor retractions. Breath sounds are clear and equal bilaterally. No wheezes/rales/rhonchi. Gastrointestinal: Soft and non tender. No rebound. No guarding.  Genitourinary:  Deferred Musculoskeletal: Normal range of motion in all extremities. No lower extremity edema. Neurologic:  Normal speech and language. No gross focal neurologic deficits are appreciated.  Skin:  Some erythema noted over left mid back. Psychiatric: Mood and affect are normal. Speech and behavior are normal. Patient exhibits appropriate insight and judgment.  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  ____________________________________________   PROCEDURES  Procedures  ____________________________________________   INITIAL IMPRESSION / ASSESSMENT AND PLAN / ED COURSE  Pertinent labs & imaging results that were available during my care of the patient were reviewed by me and considered in my medical decision making (see chart for details).   Patient presented to the emergency department today with continued left back pain.  Differential be broad including pneumonia, pneumothorax, ACS, shingles, costochondritis amongst other etiologies.  Patient's chest x-ray and blood work without concerning findings.  Does have some erythema to her left back.  Did discuss shingles with the patient but she states that she is on a medication to help prevent shingles.  Unclear which medication this was.  Patient is on methotrexate but I do not see that she takes antivirals.  Was going to try Lidoderm patch however patient left prior to administration.  By the time I was notified  she wanted to leave she had already left.  ____________________________________________   FINAL CLINICAL IMPRESSION(S) / ED DIAGNOSES  Final diagnoses:  Left-sided back pain, unspecified back location, unspecified chronicity     Note: This dictation was prepared with Dragon dictation. Any transcriptional errors that result from this process are unintentional     Nance Pear, MD 07/03/17 [phone]

## 2017-07-03 NOTE — ED Notes (Signed)
Pt decided that the wait was too long and left AMA with her daughter. Pt refused to sign AMA paperwork.

## 2017-07-03 NOTE — ED Notes (Signed)
400 ml urine output

## 2017-07-03 NOTE — ED Notes (Signed)
Oxygen saturation 98% with ambulation.

## 2017-07-03 NOTE — Discharge Instructions (Addendum)
1.  Wear compression stockings and elevate your legs as often as possible to reduce swelling. 2.  Return to the ER for worsening symptoms, persistent vomiting, difficulty breathing or other concerns.

## 2017-07-03 NOTE — ED Notes (Signed)
First Nurse Note:  Patient sitting in [facility] in [place] with daughter.  No new complaints verbalized, wait explained to patient.

## 2017-12-12 ENCOUNTER — Other Ambulatory Visit: Payer: Self-pay | Admitting: Physician Assistant

## 2017-12-12 DIAGNOSIS — Z1231 Encounter for screening mammogram for malignant neoplasm of breast: Secondary | ICD-10-CM

## 2018-12-03 ENCOUNTER — Emergency Department: Payer: Medicare Other

## 2018-12-03 ENCOUNTER — Other Ambulatory Visit: Payer: Self-pay

## 2018-12-03 ENCOUNTER — Encounter: Payer: Self-pay | Admitting: Intensive Care

## 2018-12-03 ENCOUNTER — Inpatient Hospital Stay
Admission: EM | Admit: 2018-12-03 | Discharge: 2018-12-05 | DRG: 247 | Disposition: A | Discharge status: Home / Self Care | Payer: Medicare Other | Attending: Internal Medicine | Admitting: Internal Medicine

## 2018-12-03 DIAGNOSIS — N189 Chronic kidney disease, unspecified: Secondary | ICD-10-CM | POA: Diagnosis present

## 2018-12-03 DIAGNOSIS — E079 Disorder of thyroid, unspecified: Secondary | ICD-10-CM | POA: Diagnosis present

## 2018-12-03 DIAGNOSIS — Z880 Allergy status to penicillin: Secondary | ICD-10-CM

## 2018-12-03 DIAGNOSIS — Z79899 Other long term (current) drug therapy: Secondary | ICD-10-CM

## 2018-12-03 DIAGNOSIS — I214 Non-ST elevation (NSTEMI) myocardial infarction: Principal | ICD-10-CM

## 2018-12-03 DIAGNOSIS — R079 Chest pain, unspecified: Secondary | ICD-10-CM

## 2018-12-03 DIAGNOSIS — I10 Essential (primary) hypertension: Secondary | ICD-10-CM | POA: Diagnosis not present

## 2018-12-03 DIAGNOSIS — Z20828 Contact with and (suspected) exposure to other viral communicable diseases: Secondary | ICD-10-CM | POA: Diagnosis present

## 2018-12-03 DIAGNOSIS — Z79890 Hormone replacement therapy (postmenopausal): Secondary | ICD-10-CM

## 2018-12-03 DIAGNOSIS — Z20822 Contact with and (suspected) exposure to covid-19: Secondary | ICD-10-CM

## 2018-12-03 DIAGNOSIS — I440 Atrioventricular block, first degree: Secondary | ICD-10-CM | POA: Diagnosis present

## 2018-12-03 DIAGNOSIS — I2510 Atherosclerotic heart disease of native coronary artery without angina pectoris: Secondary | ICD-10-CM | POA: Diagnosis present

## 2018-12-03 DIAGNOSIS — I129 Hypertensive chronic kidney disease with stage 1 through stage 4 chronic kidney disease, or unspecified chronic kidney disease: Secondary | ICD-10-CM | POA: Diagnosis present

## 2018-12-03 DIAGNOSIS — J449 Chronic obstructive pulmonary disease, unspecified: Secondary | ICD-10-CM | POA: Diagnosis present

## 2018-12-03 DIAGNOSIS — Z87891 Personal history of nicotine dependence: Secondary | ICD-10-CM | POA: Diagnosis not present

## 2018-12-03 DIAGNOSIS — Z888 Allergy status to other drugs, medicaments and biological substances status: Secondary | ICD-10-CM | POA: Diagnosis not present

## 2018-12-03 DIAGNOSIS — E039 Hypothyroidism, unspecified: Secondary | ICD-10-CM | POA: Diagnosis present

## 2018-12-03 DIAGNOSIS — M069 Rheumatoid arthritis, unspecified: Secondary | ICD-10-CM | POA: Diagnosis present

## 2018-12-03 HISTORY — DX: Disorder of thyroid, unspecified: E07.9

## 2018-12-03 LAB — COMPREHENSIVE METABOLIC PANEL
ALT: 57 U/L — ABNORMAL HIGH (ref 0–44)
AST: 52 U/L — ABNORMAL HIGH (ref 15–41)
Albumin: 4 g/dL (ref 3.5–5.0)
Alkaline Phosphatase: 72 U/L (ref 38–126)
Anion gap: 9 (ref 5–15)
BUN: 15 mg/dL (ref 8–23)
CO2: 26 mmol/L (ref 22–32)
Calcium: 10.1 mg/dL (ref 8.9–10.3)
Chloride: 103 mmol/L (ref 98–111)
Creatinine, Ser: 0.81 mg/dL (ref 0.44–1.00)
GFR calc Af Amer: >60 mL/min (ref >60)
GFR calc non Af Amer: >60 mL/min (ref >60)
Glucose, Bld: 92 mg/dL (ref 70–99)
Potassium: 3.7 mmol/L (ref 3.5–5.1)
Sodium: 138 mmol/L (ref 135–145)
Total Bilirubin: 0.7 mg/dL (ref 0.3–1.2)
Total Protein: 8.3 g/dL — ABNORMAL HIGH (ref 6.5–8.1)

## 2018-12-03 LAB — CBC
HCT: 38.1 % (ref 36.0–46.0)
Hemoglobin: 12.9 g/dL (ref 12.0–15.0)
MCH: 31.9 pg (ref 26.0–34.0)
MCHC: 33.9 g/dL (ref 30.0–36.0)
MCV: 94.3 fL (ref 80.0–100.0)
Platelets: 231 10*3/uL (ref 150–400)
RBC: 4.04 MIL/uL (ref 3.87–5.11)
RDW: 14.3 % (ref 11.5–15.5)
WBC: 8.6 10*3/uL (ref 4.0–10.5)
nRBC: 0 % (ref 0.0–0.2)

## 2018-12-03 LAB — TROPONIN I (HIGH SENSITIVITY)
Troponin I (High Sensitivity): 1506 ng/L (ref <18)
Troponin I (High Sensitivity): 1706 ng/L (ref <18)

## 2018-12-03 LAB — APTT: aPTT: 24 seconds (ref 24–36)

## 2018-12-03 LAB — PROTIME-INR
INR: 1.2 (ref 0.8–1.2)
Prothrombin Time: 14.7 seconds (ref 11.4–15.2)

## 2018-12-03 MED ORDER — HEPARIN BOLUS VIA INFUSION
4000.0000 [IU] | Freq: Once | INTRAVENOUS | Status: AC
  Administered 2018-12-03: 4000 [IU] via INTRAVENOUS
  Filled 2018-12-03: qty 4000

## 2018-12-03 MED ORDER — HEPARIN (PORCINE) 25000 UT/250ML-% IV SOLN
1000.0000 [IU]/h | INTRAVENOUS | Status: DC
  Administered 2018-12-03: 800 [IU]/h via INTRAVENOUS
  Filled 2018-12-03: qty 250

## 2018-12-03 MED ORDER — ENOXAPARIN SODIUM 40 MG/0.4ML ~~LOC~~ SOLN: 40.0000 mg | SUBCUTANEOUS | Status: DC

## 2018-12-03 MED ORDER — ONDANSETRON HCL 4 MG/2ML IJ SOLN: 4.0000 mg | Freq: Four times a day (QID) | INTRAMUSCULAR | Status: DC | PRN

## 2018-12-03 MED ORDER — LEVOTHYROXINE SODIUM 50 MCG PO TABS
50.0000 ug | ORAL_TABLET | Freq: Every day | ORAL | Status: DC
  Administered 2018-12-04 – 2018-12-05 (×2): 50 ug via ORAL
  Filled 2018-12-03 (×2): qty 1

## 2018-12-03 MED ORDER — HYDROMORPHONE HCL 1 MG/ML IJ SOLN: 0.5000 mg | INTRAMUSCULAR | Status: DC | PRN

## 2018-12-03 MED ORDER — ACETAMINOPHEN 325 MG PO TABS: 650.0000 mg | ORAL_TABLET | ORAL | Status: DC | PRN

## 2018-12-03 MED ORDER — DULOXETINE HCL 30 MG PO CPEP: 30.0000 mg | ORAL_CAPSULE | Freq: Every day | ORAL | Status: DC

## 2018-12-03 MED ORDER — NITROGLYCERIN 2 % TD OINT
0.5000 [in_us] | TOPICAL_OINTMENT | TRANSDERMAL | Status: AC
  Administered 2018-12-03: 0.5 [in_us] via TOPICAL
  Filled 2018-12-03: qty 1

## 2018-12-03 MED ORDER — ASPIRIN 81 MG PO CHEW
324.0000 mg | CHEWABLE_TABLET | Freq: Once | ORAL | Status: AC
  Administered 2018-12-03: 324 mg via ORAL
  Filled 2018-12-03: qty 4

## 2018-12-03 MED ORDER — HYDROCHLOROTHIAZIDE 25 MG PO TABS
25.0000 mg | ORAL_TABLET | Freq: Every day | ORAL | Status: DC
  Administered 2018-12-05: 25 mg via ORAL
  Filled 2018-12-03: qty 1

## 2018-12-03 NOTE — ED Notes (Signed)
Resumed care from [place] rn.  Pt alert.  Labs sent.  Iv meds infusing.  Sinus brady on monitor.  No chest pain.

## 2018-12-03 NOTE — ED Notes (Signed)
Blue tube sent to lab 

## 2018-12-03 NOTE — ED Provider Notes (Signed)
[facility] Emergency Department Provider Note  ____________________________________________   First MD Initiated Contact with Patient 12/03/18 1825     (approximate)  I have reviewed the triage vital signs and the nursing notes.   HISTORY  Chief Complaint Chest Pain  HPI Kimberly Burke is a 78 y.o. female here for evaluation of the intermittent left-sided chest pressure  Patient reports she is been having "achy" feeling the left chest but not really pain.  It woke her up with a discomfort last night and sweats.  No fevers no chills no cough.  The pain is located over the left mid chest that radiates somewhat towards her left armpit area at times.  Comes and goes will sometimes last a few hours and stay away for a few hours  Has not taken anything to alleviate the pain.  Currently reports a very slight "ache" that is very mild over the left chest  Seen in urgent care encouraged to come to the ER to make sure things checked out okay Denies any history of known heart disease but does have hypertension   Past Medical History:  Diagnosis Date  . Arthritis   . Hypertension   . Thyroid disease     There are no active problems to display for this patient.   History reviewed. No pertinent surgical history.  Prior to Admission medications   Medication Sig Start Date End Date Taking? Authorizing Provider  acetaminophen (TYLENOL) 650 MG CR tablet Take 1,300 mg by mouth daily.     [provider]  atenolol (TENORMIN) 100 MG tablet Take 1 tablet by mouth daily. 05/09/17   [provider]  fluticasone (FLONASE) 50 MCG/ACT nasal spray Place 1 spray into both nostrils daily. 05/10/17   [provider]  hydrochlorothiazide (HYDRODIURIL) 25 MG tablet Take 1 tablet by mouth daily. 05/10/17   [provider]  hydroxychloroquine (PLAQUENIL) 200 MG tablet Take 2 tablets by mouth daily.  06/06/17   [provider]  levothyroxine  (SYNTHROID, LEVOTHROID) 50 MCG tablet Take 1 tablet by mouth daily. 05/10/17   [provider]  Methotrexate Sodium (METHOTREXATE, PF,) 50 MG/2ML injection Inject 15 mg into the muscle once a week. 0.6 ml injection 06/15/17   [provider]    Allergies Codeine and Penicillins  Family History  Problem Relation Age of Onset  . Cancer Brother        prostate or colon unsure which   . Breast cancer Sister 28    Social History Social History   Tobacco Use  . Smoking status: Former Research scientist (life sciences)  . Smokeless tobacco: Never Used  Substance Use Topics  . Alcohol use: Never    Frequency: Never  . Drug use: Never    Review of Systems Constitutional: No fever/chills or known Covid exposure Eyes: No visual changes. ENT: No sore throat.  Pain sometimes radiates towards the neck Cardiovascular: See HPI Respiratory: Denies shortness of breath. Gastrointestinal: No abdominal pain.   Musculoskeletal: Negative for back pain. Neurological: Negative forl weakness  No ripping tearing or moving pain denoted  ____________________________________________   PHYSICAL EXAM:  VITAL SIGNS: ED Triage Vitals [12/03/18 1638]  Enc Vitals Group     BP (!) 135/94     Pulse Rate 64     Resp 16     Temp 98.7 F (37.1 C)     Temp Source Oral     SpO2 96 %     Weight 180 lb (81.6 kg)  Height 5\' 2"  (1.575 m)     Head Circumference      Peak Flow      Pain Score 4     Pain Loc      Pain Edu?      Excl. in [place]?     Constitutional: Alert and oriented. Well appearing and in no acute distress. Eyes: Conjunctivae are normal. Head: Atraumatic. Nose: No congestion/rhinnorhea. Mouth/Throat: Mucous membranes are moist. Neck: No stridor.  Cardiovascular: Normal rate, regular rhythm. Grossly normal heart sounds.  Good peripheral circulation. Respiratory: Normal respiratory effort.  No retractions. Lungs CTAB. Gastrointestinal: Soft and nontender. No distention. Musculoskeletal: No  lower extremity tenderness nor edema. Neurologic:  Normal speech and language. No gross focal neurologic deficits are appreciated.  Skin:  Skin is warm, dry and intact. No rash noted. Psychiatric: Mood and affect are normal. Speech and behavior are normal.  ____________________________________________   LABS (all labs ordered are listed, but only abnormal results are displayed)  Labs Reviewed  COMPREHENSIVE METABOLIC PANEL - Abnormal; Notable for the following components:      Result Value   Total Protein 8.3 (*)    AST 52 (*)    ALT 57 (*)    All other components within normal limits  TROPONIN I (HIGH SENSITIVITY) - Abnormal; Notable for the following components:   Troponin I (High Sensitivity) 1,506 (*)    All other components within normal limits  SARS CORONAVIRUS 2 (TAT 6-24 HRS)  CBC  TROPONIN I (HIGH SENSITIVITY)   ____________________________________________  EKG  Reviewed entered by me at 1642 Heart rate 60 QRs 90 QTc 450 Normal sinus rhythm, small inferior Q waves.  Possible anterior infarct of unknown timing.  No ST elevation ____________________________________________  RADIOLOGY  Dg Chest 2 View  Result Date: 12/03/2018 CLINICAL DATA:  Chest pain EXAM: CHEST - 2 VIEW COMPARISON:  February 02, 2017 FINDINGS: The heart size is stable from prior study. Aortic calcifications are noted. There is blunting of the costophrenic angles bilaterally similar to prior study. Again noted are findings of COPD without evidence for a focal infiltrate. Again noted are prominent interstitial lung markings. IMPRESSION: Stable exam. Again noted are findings of COPD without evidence for a focal infiltrate. Prominent interstitial lung markings may be secondary to edema or interstitial lung disease. Electronically Signed   By: Constance Holster M.D.   On: 12/03/2018 17:11    Imaging reviewed negative for acute ____________________________________________   PROCEDURES  Procedure(s)  performed: None  Procedures  Critical Care performed: Yes, see critical care note(s)  CRITICAL CARE Performed by: Delman Kitten   Total critical care time: 25 minutes  Critical care time was exclusive of separately billable procedures and treating other patients.  Critical care was necessary to treat or prevent imminent or life-threatening deterioration.  Critical care was time spent personally by me on the following activities: development of treatment plan with patient and/or surrogate as well as nursing, discussions with consultants, evaluation of patient's response to treatment, examination of patient, obtaining history from patient or surrogate, ordering and performing treatments and interventions, ordering and review of laboratory studies, ordering and review of radiographic studies, pulse oximetry and re-evaluation of patient's condition.  ____________________________________________   INITIAL IMPRESSION / ASSESSMENT AND PLAN / ED COURSE  Pertinent labs & imaging results that were available during my care of the patient were reviewed by me and considered in my medical decision making (see chart for details).   Differential diagnosis includes, but is not limited to,  ACS, aortic dissection, pulmonary embolism, cardiac tamponade, pneumothorax, pneumonia, pericarditis, myocarditis, GI-related causes including esophagitis/gastritis, and musculoskeletal chest wall pain.  Patient does present with chest pain, possible inferior Q waves as well as elevated troponin.  Discussed case and care with cardiology, treating symptomatically and will withhold anticoagulation at this time given the very minimal nature of her discomfort.  No ST elevation MI.  Cardiology will consult, will admit to the hospitalist service.  Trial aspirin and nitrates.  No ripping tearing or moving pain.  No severe hypertension.  No dyspnea.  Does not appear to have clinical findings to suggest a high risk for dissection,  PE, pneumothorax.  I am concerned about possibility of ACS or unstable angina  CHARNA FATHI was evaluated in Emergency Department on 12/03/2018 for the symptoms described in the history of present illness. She was evaluated in the context of the global COVID-19 pandemic, which necessitated consideration that the patient might be at risk for infection with the SARS-CoV-2 virus that causes COVID-19. Institutional protocols and algorithms that pertain to the evaluation of patients at risk for COVID-19 are in a state of rapid change based on information released by regulatory bodies including the CDC and federal and state organizations. These policies and algorithms were followed during the patient's care in the ED.         ____________________________________________   FINAL CLINICAL IMPRESSION(S) / ED DIAGNOSES  Final diagnoses:  Chest pain with high risk for cardiac etiology        Note:  This document was prepared using Dragon voice recognition software and may include unintentional dictation errors       Delman Kitten, MD 12/03/18 2139

## 2018-12-03 NOTE — ED Notes (Signed)
Light green tube sent to lab 

## 2018-12-03 NOTE — ED Notes (Signed)
Report given to Amy RN.

## 2018-12-03 NOTE — Progress Notes (Signed)
ANTICOAGULATION CONSULT NOTE - Initial Consult  Pharmacy Consult for Heparin Drip Indication: chest pain/ACS  Allergies  Allergen Reactions  . Codeine Itching  . Penicillins Other (See Comments)    unknown    Patient Measurements: Height: 5\' 2"  (157.5 cm) Weight: 180 lb (81.6 kg) IBW/kg (Calculated) : 50.1 Heparin Dosing Weight: 68.3 kg  Vital Signs: Temp: 98.7 F (37.1 C) (11/09 1638) Temp Source: Oral (11/09 1638) BP: 124/76 (11/09 2030) Pulse Rate: 63 (11/09 2045)  Labs: Recent Labs    12/03/18 1647 12/03/18 1811  HGB 12.9  --   HCT 38.1  --   PLT 231  --   CREATININE 0.81  --   TROPONINIHS 1,506* 1,706*    Estimated Creatinine Clearance: 57.6 mL/min (by C-G formula based on SCr of 0.81 mg/dL).   Medical History: Past Medical History:  Diagnosis Date  . Arthritis   . Hypertension   . Thyroid disease    Assessment: Patient is a 78yo female admitted with chest pain. Troponin elevated and trending up. Pharmacy consulted for Heparin dosing.  Goal of Therapy:  Heparin level 0.3-0.7 units/ml Monitor platelets by anticoagulation protocol: Yes   Plan:  Give 4000 units bolus x 1 Start heparin infusion at 800 units/hr Check anti-Xa level in 8 hours and daily while on heparin Continue to monitor H&H and platelets  Paulina Fusi, PharmD, BCPS 12/03/2018 9:05 PM

## 2018-12-03 NOTE — ED Provider Notes (Signed)
[facility] Emergency Department Provider Note  ____________________________________________   None    (approximate)   I have reviewed the triage vital signs and the nursing notes.   HISTORY  Chief Complaint Chest Pain    HPI Kimberly Burke is a 78 y.o. female presents to the emergency department with a complaint of intermittent chest pressure that started yesterday. She was at the pharmacy earlier and had her BP checked, which was more elevated that usual. The pharmacist advised her to come for evaluation. Chest pre .   Patient will receive a medical screening exam as detailed below.  Based off of this exam, more in depth exam, labs, imaging will be performed as needed for complaint.  Patient care will be eventually transferred to another provider in the emergency department for final exam, diagnosis and disposition.    Past Medical History:  Diagnosis Date  . Arthritis   . Hypertension   . Thyroid disease     There are no active problems to display for this patient.   History reviewed. No pertinent surgical history.  Prior to Admission medications   Medication Sig Start Date End Date Taking? Authorizing Provider  acetaminophen (TYLENOL) 650 MG CR tablet Take 1,000 mg by mouth daily.    [provider]  atenolol (TENORMIN) 100 MG tablet Take 1 tablet by mouth daily. 05/09/17   [provider]  fluticasone (FLONASE) 50 MCG/ACT nasal spray Place 1 spray into both nostrils daily. 05/10/17   [provider]  hydrochlorothiazide (HYDRODIURIL) 25 MG tablet Take 1 tablet by mouth daily. 05/10/17   [provider]  hydroxychloroquine (PLAQUENIL) 200 MG tablet Take 2 tablets by mouth daily.  06/06/17   [provider]  levothyroxine (SYNTHROID, LEVOTHROID) 50 MCG tablet Take 1 tablet by mouth daily. 05/10/17   [provider]  Methotrexate Sodium (METHOTREXATE, PF,) 50 MG/2ML injection Inject 15 mg into  the muscle once a week. 0.6 ml injection 06/15/17   [provider]    Allergies Codeine and Penicillins  Family History  Problem Relation Age of Onset  . Cancer Brother        prostate or colon unsure which   . Breast cancer Sister 46    Social History Social History   Tobacco Use  . Smoking status: Former Research scientist (life sciences)  . Smokeless tobacco: Never Used  Substance Use Topics  . Alcohol use: Never    Frequency: Never  . Drug use: Never    Review of Systems Constitutional: Negative for fever ENT: Negative for nasal congestion/rhinorhea. No sore throat Cardiovascular: Positive for chest pain. Respiratory: Negative for cough. Negative for shortness of breath/difficulty breathing Gastroenterology: No abdominal pain Musculoskeletal: Negative for musculoskeletal pain Integumentary: Negative for rash. Neurological: No focal weakness nor numbness.   ____________________________________________   PHYSICAL EXAM:  VITAL SIGNS: ED Triage Vitals [12/03/18 1638]  Enc Vitals Group     BP (!) 135/94     Pulse Rate 64     Resp 16     Temp 98.7 F (37.1 C)     Temp Source Oral     SpO2 96 %     Weight 180 lb (81.6 kg)     Height 5\' 2"  (1.575 m)     Head Circumference      Peak Flow      Pain Score 4     Pain Loc      Pain Edu?      Excl. in [place]?  Constitutional: Alert and oriented. Generally well appearing and in no acute distress. Eyes: Conjunctivae are normal.  Nose: No significant congestion/rhinnorhea. Mouth: No gross oropharyngeal edema. No erythema/edema Neck: No stridor.  No meningeal signs.   Cardiovascular: Grossly normal heart sounds. Respiratory: Normal respiratory effort without significant tachypnea and no observed retractions. Lungs clear. Gastrointestinal: No significant visible abdominal wall findings.  Bowel sounds x4 quadrants. No tenderness to palpation. Musculoskeletal: No gross deformities of extremities. Neurologic:  Normal speech and  language. No gross focal neurologic deficits are appreciated.  Skin:  Skin is warm, dry and intact. No rash noted.    ____________________________________________   LABS (all labs ordered are listed, but only abnormal results are displayed)  Labs Reviewed  CBC  COMPREHENSIVE METABOLIC PANEL  TROPONIN I (HIGH SENSITIVITY)    ____________________________________________   RADIOLOGY Official radiology report(s): No results found.  ____________________________________________    INITIAL IMPRESSION / MDM / ASSESSMENT AND PLAN / ED COURSE    Clinical Impression: Chest pain.   Plan: Chest pain protocol and await ER room assignment.  Patient has been screened based based on their arrival complaint, evaluated for an emergent condition, and at a minimum has received a medical screening exam.  At this time, patient will receive the further work-up listed above that was determined by medical screening exam.  Patient care will be transferred to another provider in the emergency department once patient is roomed for final diagnosis and disposition.    ____________________________________________  Note:  This document was prepared using Systems analyst and may include unintentional dictation errors.    Victorino Dike, FNP 12/03/18 2344    Harvest Dark, MD 12/04/18 2117

## 2018-12-03 NOTE — ED Notes (Signed)
Dr. Jacqualine Code informed of critical trop

## 2018-12-03 NOTE — ED Notes (Signed)
Pt given pillow, offered daughter a blanket but she declines

## 2018-12-03 NOTE — ED Triage Notes (Signed)
Patient c/o intermittent left sided dull chest pain that radiates  To neck/jaw and around to back X2 days. C/o lightheadedness. A&O x4 in triage

## 2018-12-03 NOTE — H&P (Signed)
History and Physical    Kimberly Burke [id number] DOB: 1940/12/15 DOA: 12/03/2018  PCP: [facility] [place]  Patient coming from: Home  I have personally briefly reviewed patient's old medical records in [place]  Chief Complaint: Chest pain  HPI: Kimberly Burke is a 78 y.o. female with medical history significant of hypertension, chronic kidney disease, hypothyroidism, rheumatoid arthritis on immunosuppressive's who presents with concerns of left-sided chest pain for the past 3 days.  Patient reports that about 3 days ago she started to notice on and off left-sided chest pain that wraps around towards her back as well as go up her neck.  The pain is dull and it is intermittent lasting 5 minutes at a time.  It can occur when she is asleep or with exertion.  She feels slightly better if there is something underneath her left shoulder.  She also endorsed being diaphoretic several times during these chest pain episodes.  However denies any shortness of breath.  No nausea vomiting or diarrhea.No fever or chills. No new cough. She denies any tobacco use or illicit drug use.  Has occasional alcohol use. No family history of any cardiac issues.  ED Course: She was afebrile and normotensive on room air.  CBC showed no leukocytosis or anemia.  CMP showed normal creatinine, mildly elevated AST of 52 and ALT of 57.  Initial troponin of 1506 and increased to 1706. EKG shows first-degree AV block but no ST elevation or depression. Chest x-ray shows findings of COPD with no other acute cardiac process.  Review of Systems:  Constitutional: No Weight Change, No Fever ENT/Mouth: No sore throat, No Rhinorrhea Eyes: No Eye Pain, No Vision Changes Cardiovascular: + Chest Pain, no SOB Respiratory: No Cough, No Sputum, No Wheezing, no Dyspnea  Gastrointestinal: No Nausea, No Vomiting, No Diarrhea, No Constipation, No Pain Genitourinary: no Urinary Incontinence, No Urgency, No Flank  Pain Musculoskeletal: No Arthralgias, No Myalgias Skin: No Skin Lesions, No Pruritus, Neuro: no Weakness, No Numbness,  No Loss of Consciousness, No Syncope Psych: No Anxiety/Panic, No Depression, no decrease appetite Heme/Lymph: No Bruising, No Bleeding  Past Medical History:  Diagnosis Date  . Arthritis   . Hypertension   . Thyroid disease     History reviewed. No pertinent surgical history.   reports that she has quit smoking. She has never used smokeless tobacco. She reports that she does not drink alcohol or use drugs.  Allergies  Allergen Reactions  . Codeine Itching  . Penicillins Other (See Comments)    unknown    Family History  Problem Relation Age of Onset  . Cancer Brother        prostate or colon unsure which   . Breast cancer Sister 19     Prior to Admission medications   Medication Sig Start Date End Date Taking? Authorizing Provider  atenolol (TENORMIN) 100 MG tablet Take 1 tablet by mouth daily. 05/09/17  Yes [provider]  hydrochlorothiazide (HYDRODIURIL) 25 MG tablet Take 1 tablet by mouth daily. 05/10/17  Yes [provider]  hydroxychloroquine (PLAQUENIL) 200 MG tablet Take 2 tablets by mouth daily.  06/06/17  Yes [provider]  levothyroxine (SYNTHROID, LEVOTHROID) 50 MCG tablet Take 1 tablet by mouth daily. 05/10/17  Yes [provider]  methotrexate (RHEUMATREX) 2.5 MG tablet Take 15 mg by mouth once a week. 09/25/18  Yes [provider]  acetaminophen (TYLENOL) 650 MG CR tablet Take 1,300 mg by mouth daily.  [provider]  DULoxetine (CYMBALTA) 30 MG capsule Take 30 mg by mouth daily. 12/03/18   [provider]  fluticasone (FLONASE) 50 MCG/ACT nasal spray Place 1 spray into both nostrils daily. 05/10/17   [provider]    Physical Exam: Vitals:   12/03/18 1638  BP: (!) 135/94  Pulse: 64  Resp: 16  Temp: 98.7 F (37.1 C)  TempSrc: Oral  SpO2: 96%  Weight: 81.6 kg   Height: 5\' 2"  (1.575 m)    Constitutional: NAD, calm, comfortable, well-appearing female sitting upright in bed Vitals:   12/03/18 1638  BP: (!) 135/94  Pulse: 64  Resp: 16  Temp: 98.7 F (37.1 C)  TempSrc: Oral  SpO2: 96%  Weight: 81.6 kg  Height: 5\' 2"  (1.575 m)   Eyes: PERRL, lids and conjunctivae normal ENMT: Mucous membranes are moist. Posterior pharynx clear of any exudate or lesions Neck: normal, supple, no masses Respiratory: clear to auscultation bilaterally, no wheezing, no crackles. Normal respiratory effort. No accessory muscle use.  Cardiovascular: Regular rate and rhythm, no murmurs / rubs / gallops. No extremity edema. 2+ pedal pulses.  No tenderness to palpation of the chest wall. Abdomen: no tenderness, no masses palpated. Bowel sounds positive.  Musculoskeletal: no clubbing / cyanosis. No joint deformity upper and lower extremities. Good ROM, no contractures. Normal muscle tone.  Skin: no rashes, lesions, ulcers. No induration Neurologic: CN 2-12 grossly intact. Sensation intact. Strength 5/5 in all 4.  Psychiatric: Normal judgment and insight. Alert and oriented x 3. Normal mood.     Labs on Admission: I have personally reviewed following labs and imaging studies  CBC: Recent Labs  Lab 12/03/18 1647  WBC 8.6  HGB 12.9  HCT 38.1  MCV 94.3  PLT [id number]   Basic Metabolic Panel: Recent Labs  Lab 12/03/18 1647  NA 138  K 3.7  CL 103  CO2 26  GLUCOSE 92  BUN 15  CREATININE 0.81  CALCIUM 10.1   GFR: Estimated Creatinine Clearance: 57.6 mL/min (by C-G formula based on SCr of 0.81 mg/dL). Liver Function Tests: Recent Labs  Lab 12/03/18 1647  AST 52*  ALT 57*  ALKPHOS 72  BILITOT 0.7  PROT 8.3*  ALBUMIN 4.0   No results for input(s): LIPASE, AMYLASE in the last 168 hours. No results for input(s): AMMONIA in the last 168 hours. Coagulation Profile: No results for input(s): INR, PROTIME in the last 168 hours. Cardiac Enzymes: No results for  input(s): CKTOTAL, CKMB, CKMBINDEX, TROPONINI in the last 168 hours. BNP (last 3 results) No results for input(s): PROBNP in the last 8760 hours. HbA1C: No results for input(s): HGBA1C in the last 72 hours. CBG: No results for input(s): GLUCAP in the last 168 hours. Lipid Profile: No results for input(s): CHOL, HDL, LDLCALC, TRIG, CHOLHDL, LDLDIRECT in the last 72 hours. Thyroid Function Tests: No results for input(s): TSH, T4TOTAL, FREET4, T3FREE, THYROIDAB in the last 72 hours. Anemia Panel: No results for input(s): VITAMINB12, FOLATE, FERRITIN, TIBC, IRON, RETICCTPCT in the last 72 hours. Urine analysis: No results found for: COLORURINE, APPEARANCEUR, LABSPEC, [place], GLUCOSEU, HGBUR, BILIRUBINUR, KETONESUR, PROTEINUR, UROBILINOGEN, NITRITE, LEUKOCYTESUR  Radiological Exams on Admission: Dg Chest 2 View  Result Date: 12/03/2018 CLINICAL DATA:  Chest pain EXAM: CHEST - 2 VIEW COMPARISON:  February 02, 2017 FINDINGS: The heart size is stable from prior study. Aortic calcifications are noted. There is blunting of the costophrenic angles bilaterally similar to prior study. Again noted are findings of COPD without  evidence for a focal infiltrate. Again noted are prominent interstitial lung markings. IMPRESSION: Stable exam. Again noted are findings of COPD without evidence for a focal infiltrate. Prominent interstitial lung markings may be secondary to edema or interstitial lung disease. Electronically Signed   By: Constance Holster M.D.   On: 12/03/2018 17:11    EKG: Independently reviewed.   Assessment/Plan  NSTEMI -Troponin of 1506 then 1706, continue to trend for peak -consulted cardiology Dr. Humphrey Rolls and will start heparin gtt  -nitro paste placed - PRN dilaudid for pain - repeat EKG overnight if chest pain worsens   Hypertension -Continue HCTZ  Rheumatoid arthritis -Hold immunosuppressive  Hypothyroidism -Continue levothyroxine   DVT prophylaxis:Heparin Code Status:Full   Family Communication: Plan discussed with patient at bedside  disposition Plan: Home with at least 2 midnight stays  Consults called: Cardiology Admission status: inpatient   Kimberly Burke Denzell Colasanti DO [facility]   If 7PM-7AM, please contact night-coverage [email or web address] Password [facility]  12/03/2018, 8:48 PM

## 2018-12-04 ENCOUNTER — Other Ambulatory Visit: Payer: Self-pay

## 2018-12-04 ENCOUNTER — Encounter
Admission: EM | Disposition: A | Discharge status: Home / Self Care | Payer: Self-pay | Source: Home / Self Care | Attending: Internal Medicine

## 2018-12-04 HISTORY — PX: CORONARY STENT INTERVENTION: CATH118234

## 2018-12-04 HISTORY — PX: LEFT HEART CATH AND CORONARY ANGIOGRAPHY: CATH118249

## 2018-12-04 LAB — CBC
HCT: 39 % (ref 36.0–46.0)
Hemoglobin: 13.1 g/dL (ref 12.0–15.0)
MCH: 32.2 pg (ref 26.0–34.0)
MCHC: 33.6 g/dL (ref 30.0–36.0)
MCV: 95.8 fL (ref 80.0–100.0)
Platelets: 220 10*3/uL (ref 150–400)
RBC: 4.07 MIL/uL (ref 3.87–5.11)
RDW: 14.6 % (ref 11.5–15.5)
WBC: 8.8 10*3/uL (ref 4.0–10.5)
nRBC: 0 % (ref 0.0–0.2)

## 2018-12-04 LAB — TROPONIN I (HIGH SENSITIVITY): Troponin I (High Sensitivity): 2262 ng/L (ref <18)

## 2018-12-04 LAB — HEPARIN LEVEL (UNFRACTIONATED): Heparin Unfractionated: 0.12 IU/mL — ABNORMAL LOW (ref 0.30–0.70)

## 2018-12-04 LAB — SARS CORONAVIRUS 2 (TAT 6-24 HRS): SARS Coronavirus 2: NEGATIVE

## 2018-12-04 LAB — POCT ACTIVATED CLOTTING TIME: Activated Clotting Time: 329 seconds

## 2018-12-04 SURGERY — LEFT HEART CATH AND CORONARY ANGIOGRAPHY
Anesthesia: Moderate Sedation

## 2018-12-04 MED ORDER — SODIUM CHLORIDE 0.9% FLUSH: 3.0000 mL | INTRAVENOUS | Status: DC | PRN

## 2018-12-04 MED ORDER — TICAGRELOR 90 MG PO TABS
ORAL_TABLET | ORAL | Status: AC
  Filled 2018-12-04: qty 2

## 2018-12-04 MED ORDER — MIDAZOLAM HCL 2 MG/2ML IJ SOLN
INTRAMUSCULAR | Status: AC
  Filled 2018-12-04: qty 2

## 2018-12-04 MED ORDER — FUROSEMIDE 10 MG/ML IJ SOLN
INTRAMUSCULAR | Status: AC
  Administered 2018-12-04: 40 mg via INTRAVENOUS
  Filled 2018-12-04: qty 4

## 2018-12-04 MED ORDER — ASPIRIN 81 MG PO CHEW
CHEWABLE_TABLET | ORAL | Status: AC
  Administered 2018-12-04: 10:00:00 81 mg via ORAL
  Filled 2018-12-04: qty 1

## 2018-12-04 MED ORDER — FENTANYL CITRATE (PF) 100 MCG/2ML IJ SOLN
INTRAMUSCULAR | Status: DC | PRN
  Administered 2018-12-04 (×2): 25 ug via INTRAVENOUS

## 2018-12-04 MED ORDER — IOHEXOL 300 MG/ML  SOLN
INTRAMUSCULAR | Status: DC | PRN
  Administered 2018-12-04: 12:00:00 60 mL via INTRA_ARTERIAL

## 2018-12-04 MED ORDER — ASPIRIN 81 MG PO CHEW
81.0000 mg | CHEWABLE_TABLET | Freq: Every day | ORAL | Status: DC
  Administered 2018-12-05: 81 mg via ORAL
  Filled 2018-12-04: qty 1

## 2018-12-04 MED ORDER — BIVALIRUDIN TRIFLUOROACETATE 250 MG IV SOLR
INTRAVENOUS | Status: AC
  Filled 2018-12-04: qty 250

## 2018-12-04 MED ORDER — SODIUM CHLORIDE 0.9% FLUSH
3.0000 mL | Freq: Two times a day (BID) | INTRAVENOUS | Status: DC
  Administered 2018-12-04: 3 mL via INTRAVENOUS

## 2018-12-04 MED ORDER — IOHEXOL 300 MG/ML  SOLN
INTRAMUSCULAR | Status: DC | PRN
  Administered 2018-12-04: 105 mL

## 2018-12-04 MED ORDER — MIDAZOLAM HCL 2 MG/2ML IJ SOLN
INTRAMUSCULAR | Status: DC | PRN
  Administered 2018-12-04: 1 mg via INTRAVENOUS

## 2018-12-04 MED ORDER — BENZONATATE 100 MG PO CAPS
100.0000 mg | ORAL_CAPSULE | Freq: Three times a day (TID) | ORAL | Status: DC | PRN
  Filled 2018-12-04: qty 1

## 2018-12-04 MED ORDER — DULOXETINE HCL 30 MG PO CPEP
30.0000 mg | ORAL_CAPSULE | Freq: Every day | ORAL | Status: DC
  Filled 2018-12-04: qty 1

## 2018-12-04 MED ORDER — SODIUM CHLORIDE 0.9% FLUSH
3.0000 mL | Freq: Two times a day (BID) | INTRAVENOUS | Status: DC
  Administered 2018-12-04 – 2018-12-05 (×2): 3 mL via INTRAVENOUS

## 2018-12-04 MED ORDER — ASPIRIN 81 MG PO CHEW
CHEWABLE_TABLET | ORAL | Status: DC | PRN
  Administered 2018-12-04: 243 mg via ORAL

## 2018-12-04 MED ORDER — TICAGRELOR 90 MG PO TABS
ORAL_TABLET | ORAL | Status: DC | PRN
  Administered 2018-12-04: 180 mg via ORAL

## 2018-12-04 MED ORDER — SODIUM CHLORIDE 0.9 % IV SOLN
INTRAVENOUS | Status: AC | PRN
  Administered 2018-12-04: 1.75 mg/kg/h via INTRAVENOUS

## 2018-12-04 MED ORDER — ONDANSETRON HCL 4 MG/2ML IJ SOLN: 4.0000 mg | Freq: Four times a day (QID) | INTRAMUSCULAR | Status: DC | PRN

## 2018-12-04 MED ORDER — HEPARIN (PORCINE) IN NACL 1000-0.9 UT/500ML-% IV SOLN
INTRAVENOUS | Status: DC | PRN
  Administered 2018-12-04: 1000 mL

## 2018-12-04 MED ORDER — SODIUM CHLORIDE 0.9 % IV SOLN
INTRAVENOUS | Status: DC
  Administered 2018-12-04: 10:00:00 via INTRAVENOUS

## 2018-12-04 MED ORDER — LABETALOL HCL 5 MG/ML IV SOLN: 10.0000 mg | INTRAVENOUS | Status: AC | PRN

## 2018-12-04 MED ORDER — TICAGRELOR 90 MG PO TABS: 90.0000 mg | ORAL_TABLET | Freq: Two times a day (BID) | ORAL | Status: DC

## 2018-12-04 MED ORDER — HEPARIN BOLUS VIA INFUSION
2000.0000 [IU] | Freq: Once | INTRAVENOUS | Status: AC
  Administered 2018-12-04: 2000 [IU] via INTRAVENOUS
  Filled 2018-12-04: qty 2000

## 2018-12-04 MED ORDER — ASPIRIN 81 MG PO CHEW
CHEWABLE_TABLET | ORAL | Status: AC
  Filled 2018-12-04: qty 3

## 2018-12-04 MED ORDER — SODIUM CHLORIDE 0.9 % WEIGHT BASED INFUSION
1.0000 mL/kg/h | INTRAVENOUS | Status: AC
  Administered 2018-12-04: 1 mL/kg/h via INTRAVENOUS

## 2018-12-04 MED ORDER — SODIUM CHLORIDE 0.9 % IV SOLN: 250.0000 mL | INTRAVENOUS | Status: DC | PRN

## 2018-12-04 MED ORDER — HYDRALAZINE HCL 20 MG/ML IJ SOLN: 10.0000 mg | INTRAMUSCULAR | Status: AC | PRN

## 2018-12-04 MED ORDER — TICAGRELOR 90 MG PO TABS
90.0000 mg | ORAL_TABLET | Freq: Two times a day (BID) | ORAL | Status: DC
  Administered 2018-12-05 (×2): 90 mg via ORAL
  Filled 2018-12-04 (×2): qty 1

## 2018-12-04 MED ORDER — ACETAMINOPHEN 325 MG PO TABS: 650.0000 mg | ORAL_TABLET | ORAL | Status: DC | PRN

## 2018-12-04 MED ORDER — HEPARIN (PORCINE) IN NACL 1000-0.9 UT/500ML-% IV SOLN
INTRAVENOUS | Status: AC
  Filled 2018-12-04: qty 1000

## 2018-12-04 MED ORDER — BIVALIRUDIN BOLUS VIA INFUSION - CUPID
INTRAVENOUS | Status: DC | PRN
  Administered 2018-12-04: 12:00:00 61.2 mg via INTRAVENOUS

## 2018-12-04 MED ORDER — FENTANYL CITRATE (PF) 100 MCG/2ML IJ SOLN
INTRAMUSCULAR | Status: AC
  Filled 2018-12-04: qty 2

## 2018-12-04 MED ORDER — SODIUM CHLORIDE 0.9 % IV SOLN
0.2500 mg/kg/h | INTRAVENOUS | Status: DC
  Filled 2018-12-04: qty 250

## 2018-12-04 MED ORDER — FUROSEMIDE 10 MG/ML IJ SOLN
40.0000 mg | Freq: Once | INTRAMUSCULAR | Status: AC
  Administered 2018-12-04: 14:00:00 40 mg via INTRAVENOUS

## 2018-12-04 MED ORDER — ASPIRIN 81 MG PO CHEW
81.0000 mg | CHEWABLE_TABLET | ORAL | Status: AC
  Administered 2018-12-04: 10:00:00 81 mg via ORAL

## 2018-12-04 MED ORDER — FENTANYL CITRATE (PF) 100 MCG/2ML IJ SOLN
INTRAMUSCULAR | Status: DC | PRN
  Administered 2018-12-04: 25 ug via INTRAVENOUS

## 2018-12-04 SURGICAL SUPPLY — 16 items
BALLN ~~LOC~~ EUPHORA RX 3.25X15 (BALLOONS) ×2
BALLOON ~~LOC~~ EUPHORA RX 3.25X15 (BALLOONS) ×1 IMPLANT
CATH INFINITI 5FR ANG PIGTAIL (CATHETERS) ×2 IMPLANT
CATH INFINITI 5FR JL4 (CATHETERS) ×2 IMPLANT
CATH INFINITI JR4 5F (CATHETERS) ×2 IMPLANT
CATH VISTA GUIDE 6FR JR4 SH (CATHETERS) ×2 IMPLANT
DEVICE CLOSURE MYNXGRIP 6/7F (Vascular Products) ×2 IMPLANT
DEVICE INFLAT 30 PLUS (MISCELLANEOUS) ×2 IMPLANT
KIT MANI 3VAL PERCEP (MISCELLANEOUS) ×2 IMPLANT
NEEDLE PERC 18GX7CM (NEEDLE) ×2 IMPLANT
PACK CARDIAC CATH (CUSTOM PROCEDURE TRAY) ×2 IMPLANT
SHEATH AVANTI 5FR X 11CM (SHEATH) ×2 IMPLANT
SHEATH AVANTI 6FR X 11CM (SHEATH) ×2 IMPLANT
STENT RESOLUTE ONYX 3.0X18 (Permanent Stent) ×2 IMPLANT
WIRE G HI TQ BMW 190 (WIRE) ×2 IMPLANT
WIRE GUIDERIGHT .035X150 (WIRE) ×2 IMPLANT

## 2018-12-04 NOTE — Consult Note (Signed)
Kimberly Burke is a 78 y.o. female  [id number]  Primary Cardiologist: Neoma Laming Reason for Consultation: Non-STEMI  HPI: This is a 78 year old pleasant white female with history of hypertension presented to the hospital with 3 days of intermittent chest pain lasting 5 to 10 minutes and coming back 3 to 4 hours later which continued on and finally she came last night to the hospital.  She was started on aspirin and heparin and chest pain has resolved at this time.  Her troponin steadily Climbing and insignificant she ruled in for non-STEMI.  Left circumflex   Review of Systems: No orthopnea PND or leg swelling normal left ventricular   Past Medical History:  Diagnosis Date  . Arthritis   . Hypertension   . Thyroid disease     (Not in a hospital admission)    . DULoxetine  30 mg Oral Daily  . hydrochlorothiazide  25 mg Oral Daily  . levothyroxine  50 mcg Oral Daily  . sodium chloride flush  3 mL Intravenous Q12H    Infusions: . heparin 1,000 Units/hr (12/04/18 0809)    Allergies  Allergen Reactions  . Codeine Itching  . Penicillins Other (See Comments)    unknown    Social History   Socioeconomic History  . Marital status: Widowed    Spouse name: Not on file  . Number of children: Not on file  . Years of education: Not on file  . Highest education level: Not on file  Occupational History  . Not on file  Social Needs  . Financial resource strain: Not on file  . Food insecurity    Worry: Not on file    Inability: Not on file  . Transportation needs    Medical: Not on file    Non-medical: Not on file  Tobacco Use  . Smoking status: Former Research scientist (life sciences)  . Smokeless tobacco: Never Used  Substance and Sexual Activity  . Alcohol use: Never    Frequency: Never  . Drug use: Never  . Sexual activity: Not on file  Lifestyle  . Physical activity    Days per week: Not on file    Minutes per session: Not on file  . Stress: Not on file  Relationships  . Social  Herbalist on phone: Not on file    Gets together: Not on file    Attends religious service: Not on file    Active member of club or organization: Not on file    Attends meetings of clubs or organizations: Not on file    Relationship status: Not on file  . Intimate partner violence    Fear of current or ex partner: Not on file    Emotionally abused: Not on file    Physically abused: Not on file    Forced sexual activity: Not on file  Other Topics Concern  . Not on file  Social History Narrative  . Not on file    Family History  Problem Relation Age of Onset  . Cancer Brother        prostate or colon unsure which   . Breast cancer Sister 16    PHYSICAL EXAM: Vitals:   12/04/18 0630 12/04/18 0730  BP: 124/86 124/71  Pulse: (!) 58   Resp: 16 20  Temp:    SpO2: 95%      Intake/Output Summary (Last 24 hours) at 12/04/2018 0859 Last data filed at 12/04/2018 0533 Gross per 24 hour  Intake  65.1 ml  Output -  Net 65.1 ml    General:  Well appearing. No respiratory difficulty HEENT: normal Neck: supple. no JVD. Carotids 2+ bilat; no bruits. No lymphadenopathy or thryomegaly appreciated. Cor: PMI nondisplaced. Regular rate & rhythm. No rubs, gallops or murmurs. Lungs: clear Abdomen: soft, nontender, nondistended. No hepatosplenomegaly. No bruits or masses. Good bowel sounds. Extremities: no cyanosis, clubbing, rash, edema Neuro: alert & oriented x 3, cranial nerves grossly intact. moves all 4 extremities w/o difficulty. Affect pleasant.  ECG: Normal sinus rhythm continue medical therapy aggressive risk factor modification follow-up in 1  Results for orders placed or performed during the hospital encounter of 12/03/18 (from the past 24 hour(s))  CBC     Status: None   Collection Time: 12/03/18  4:47 PM  Result Value Ref Range   WBC 8.6 4.0 - 10.5 K/uL   RBC 4.04 3.87 - 5.11 MIL/uL   Hemoglobin 12.9 12.0 - 15.0 g/dL   HCT 38.1 36.0 - 46.0 %   MCV 94.3 80.0  - 100.0 fL   MCH 31.9 26.0 - 34.0 pg   MCHC 33.9 30.0 - 36.0 g/dL   RDW 14.3 11.5 - 15.5 %   Platelets 231 150 - 400 K/uL   nRBC 0.0 0.0 - 0.2 %  Troponin I (High Sensitivity)     Status: Abnormal   Collection Time: 12/03/18  4:47 PM  Result Value Ref Range   Troponin I (High Sensitivity) 1,506 (HH) <18 ng/L  Comprehensive metabolic panel     Status: Abnormal   Collection Time: 12/03/18  4:47 PM  Result Value Ref Range   Sodium 138 135 - 145 mmol/L   Potassium 3.7 3.5 - 5.1 mmol/L   Chloride 103 98 - 111 mmol/L   CO2 26 22 - 32 mmol/L   Glucose, Bld 92 70 - 99 mg/dL   BUN 15 8 - 23 mg/dL   Creatinine, Ser 0.81 0.44 - 1.00 mg/dL   Calcium 10.1 8.9 - 10.3 mg/dL   Total Protein 8.3 (H) 6.5 - 8.1 g/dL   Albumin 4.0 3.5 - 5.0 g/dL   AST 52 (H) 15 - 41 U/L   ALT 57 (H) 0 - 44 U/L   Alkaline Phosphatase 72 38 - 126 U/L   Total Bilirubin 0.7 0.3 - 1.2 mg/dL   GFR calc non Af Amer >60 >60 mL/min   GFR calc Af Amer >60 >60 mL/min   Anion gap 9 5 - 15  Troponin I (High Sensitivity)     Status: Abnormal   Collection Time: 12/03/18  6:11 PM  Result Value Ref Range   Troponin I (High Sensitivity) 1,706 (HH) <18 ng/L  SARS CORONAVIRUS 2 (TAT 6-24 HRS) Nasopharyngeal Nasopharyngeal Swab     Status: None   Collection Time: 12/03/18  9:05 PM   Specimen: Nasopharyngeal Swab  Result Value Ref Range   SARS Coronavirus 2 NEGATIVE NEGATIVE  APTT     Status: None   Collection Time: 12/03/18  9:05 PM  Result Value Ref Range   aPTT 24 24 - 36 seconds  Protime-INR     Status: None   Collection Time: 12/03/18  9:05 PM  Result Value Ref Range   Prothrombin Time 14.7 11.4 - 15.2 seconds   INR 1.2 0.8 - 1.2  Troponin I (High Sensitivity)     Status: Abnormal   Collection Time: 12/03/18 11:43 PM  Result Value Ref Range   Troponin I (High Sensitivity) 2,262 (HH) <18 ng/L  Heparin level (unfractionated)     Status: Abnormal   Collection Time: 12/04/18  5:54 AM  Result Value Ref Range   Heparin  Unfractionated 0.12 (L) 0.30 - 0.70 IU/mL   Dg Chest 2 View  Result Date: 12/03/2018 CLINICAL DATA:  Chest pain EXAM: CHEST - 2 VIEW COMPARISON:  February 02, 2017 FINDINGS: The heart size is stable from prior study. Aortic calcifications are noted. There is blunting of the costophrenic angles bilaterally similar to prior study. Again noted are findings of COPD without evidence for a focal infiltrate. Again noted are prominent interstitial lung markings. IMPRESSION: Stable exam. Again noted are findings of COPD without evidence for a focal infiltrate. Prominent interstitial lung markings may be secondary to edema or interstitial lung disease. Electronically Signed   By: Constance Holster M.D.   On: 12/03/2018 17:11     ASSESSMENT AND PLAN: Non-STEMI with classic symptoms of typical anginal type is chest pain with left precordial heaviness radiating to the jaw as well as radiating to the back associated with shortness of breath and increasing frequency and duration.  Advised cardiac catheterization.  Patient was explained risk and benefits and patient has agreed to proceed with the left heart catheterization via right groin.  Kierre Hintz A

## 2018-12-04 NOTE — Progress Notes (Signed)
ANTICOAGULATION CONSULT NOTE - Initial Consult  Pharmacy Consult for Heparin Drip Indication: chest pain/ACS  Allergies  Allergen Reactions  . Codeine Itching  . Penicillins Other (See Comments)    unknown    Patient Measurements: Height: 5\' 2"  (157.5 cm) Weight: 180 lb (81.6 kg) IBW/kg (Calculated) : 50.1 Heparin Dosing Weight: 68.3 kg  Vital Signs: BP: 124/86 (11/10 0630) Pulse Rate: 58 (11/10 0630)  Labs: Recent Labs    12/03/18 1647 12/03/18 1811 12/03/18 2105 12/03/18 2343 12/04/18 0554  HGB 12.9  --   --   --   --   HCT 38.1  --   --   --   --   PLT 231  --   --   --   --   APTT  --   --  24  --   --   LABPROT  --   --  14.7  --   --   INR  --   --  1.2  --   --   HEPARINUNFRC  --   --   --   --  0.12*  CREATININE 0.81  --   --   --   --   TROPONINIHS 1,506* 1,706*  --  2,262*  --     Estimated Creatinine Clearance: 57.6 mL/min (by C-G formula based on SCr of 0.81 mg/dL).   Medical History: Past Medical History:  Diagnosis Date  . Arthritis   . Hypertension   . Thyroid disease    Assessment: Patient is a 78yo female admitted with chest pain. Troponin elevated and trending up. Pharmacy consulted for Heparin dosing.  11/10@0554 : HL 0.12 subtherapeutic  Goal of Therapy:  Heparin level 0.3-0.7 units/ml Monitor platelets by anticoagulation protocol: Yes   Plan:  Will rebolus with 2000 units x 1 Increase heparin infusion at 1000 units/hr Check anti-Xa level in 8 hours and daily while on heparin Continue to monitor H&H and platelets  Pearla Dubonnet, PharmD Clinical Pharmacist 12/04/2018 8:09 AM

## 2018-12-04 NOTE — OR Nursing (Signed)
Pt voided 125 ml of clear yellow urine post lasix. Reporting no shortness of breath. Transferred to rm 249.

## 2018-12-04 NOTE — ED Notes (Signed)
Heparin dose change and 2,000 unit bolus verified with Anda Kraft, Therapist, sports.

## 2018-12-04 NOTE — OR Nursing (Signed)
Pt reports feeling short of breath, oxygen saturations 96%, placed on 2 liters [place], ETCO2 33. After oxygen started pt reports breathing better but reporting intermittent episodes of shortness of breath. VS unchanged

## 2018-12-04 NOTE — ED Notes (Signed)
Pt assisted to the bathroom by this RN.  

## 2018-12-04 NOTE — ED Notes (Signed)
This RN to bedside, introduced self to patient, pt resting in bed with lights dimmed, TV on. Pt visualized in NAD at this time. Will continue to monitor for further patient needs.

## 2018-12-04 NOTE — Progress Notes (Signed)
PROGRESS NOTE  Kimberly Burke [id number] DOB: [birth year] DOA: 12/03/2018 [facility]CP: [facility] [place]  Brief History    Kimberly Burke is a 78 y.o. female with medical history significant of hypertension, chronic kidney disease, hypothyroidism, rheumatoid arthritis on immunosuppressive's who presents with concerns of left-sided chest pain for the past 3 days.  Patient reports that about 3 days ago she started to notice on and off left-sided chest pain that wraps around towards her back as well as go up her neck.  The pain is dull and it is intermittent lasting 5 minutes at a time.  It can occur when she is asleep or with exertion.  She feels slightly better if there is something underneath her left shoulder.  She also endorsed being diaphoretic several times during these chest pain episodes.  However denies any shortness of breath.  No nausea vomiting or diarrhea.No fever or chills. No new cough. She denies any tobacco use or illicit drug use.  Has occasional alcohol use. No family history of any cardiac issues.  She was afebrile and normotensive on room air.  CBC showed no leukocytosis or anemia.  CMP showed normal creatinine, mildly elevated AST of 52 and ALT of 57.  Initial troponin of 1506 and increased to 1706. EKG shows first-degree AV block but no ST elevation or depression. Chest x-ray shows findings of COPD with no other acute cardiac process.  The patient was admitted to a telemetry bed. Cardiology was consulted and the patient underwent LHC today. She was found to have 75% stenosis of the proximal RCA. A drug-eluting stent was placed. It is recommended that the patient be maintained on dual antiplatelet therapy with ASA 81 mg and Ticagrelor 90 mg bid.  Consultants  . Cardiology  Procedures  . LHC  Antibiotics  . None  Subjective  The patient is resting comfortably. No new complaints.  Objective   Vitals:  Vitals:   12/04/18 1400 12/04/18 1453  BP: 129/80  120/79  Pulse: 62 64  Resp: 19   Temp:    SpO2: 100% 100%    I have personally reviewed the following:   Today's Data  . Vitals, CBC  Cardiology Data  . 75% Stenosis of Proximal RCA s/p stenting with DES. .  Scheduled Meds: . aspirin  81 mg Oral Daily  . DULoxetine  30 mg Oral Daily  . hydrochlorothiazide  25 mg Oral Daily  . levothyroxine  50 mcg Oral Daily  . sodium chloride flush  3 mL Intravenous Q12H  . sodium chloride flush  3 mL Intravenous Q12H  . [START ON 12/05/2018] ticagrelor  90 mg Oral BID   Continuous Infusions: . sodium chloride    . sodium chloride 1 mL/kg/hr (12/04/18 1512)    Principal Problem:   NSTEMI (non-ST elevated myocardial infarction) ([place]) Active Problems:   Hypothyroid   Essential hypertension   Rheumatoid arthritis ([place])   LOS: 1 day   A & P   NSTEMI: Troponin of 1506 then 1706. Cardiology was consulted. Dr. Humphrey Rolls had started heparin gtt and nitro paste placed. She received PRN dilaudid for pain.  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 A drug eleuting stent was placed. She will be placed on dual antiplatelet therapy.  Hypertension: Blood pressures are well controlled on HCTZ.  Rheumatoid arthritis: Hold immunosuppressive.  Hypothyroidism: Continue levothyroxine.  I have seen and examined this patient myself. I have spent 32 minutes in her evaluation and care.  DVT prophylaxis:Heparin  Code Status:Full  Family Communication: None available Disposition Plan: Home with at least 2 midnight stays   Kimberly Buchner, DO [facility] Direct contact: see [email or web address]  7PM-7AM contact night coverage as above 12/04/2018, 6:24 PM  LOS: 1 day

## 2018-12-04 NOTE — OR Nursing (Signed)
Dr Chancy Milroy notified that pt reporting periodically having trouble catching her breath. VS stable on 2 liters [place]. 40 mg IV lasix ordered and purewick in place.

## 2018-12-04 NOTE — ED Notes (Signed)
Pt resting with eyes closed.  Family with pt.  Iv meds infusing.

## 2018-12-04 NOTE — Progress Notes (Signed)
Patient had high-grade lesion in the proximal RCA requiring PCI with drug-eluting stent.  Patient can be discharged tomorrow with follow-up in the office end of this week.

## 2018-12-04 NOTE — ED Notes (Addendum)
Report off to megan rn. 

## 2018-12-04 NOTE — ED Notes (Signed)
Pt sleeping  Sinus brady on monitor.   

## 2018-12-04 NOTE — ED Notes (Signed)
0500 labs drawn by this RN, medications administered per MD order. Pt tolerated well. Lights remain dimmed, TV turned on for patient comfort. Call bell remains within reach. Pt denies further needs. Will continue to monitor for further patient needs.

## 2018-12-05 ENCOUNTER — Encounter: Payer: Self-pay | Admitting: Cardiovascular Disease

## 2018-12-05 LAB — CBC
HCT: 38.5 % (ref 36.0–46.0)
Hemoglobin: 13.3 g/dL (ref 12.0–15.0)
MCH: 32.4 pg (ref 26.0–34.0)
MCHC: 34.5 g/dL (ref 30.0–36.0)
MCV: 93.7 fL (ref 80.0–100.0)
Platelets: 209 10*3/uL (ref 150–400)
RBC: 4.11 MIL/uL (ref 3.87–5.11)
RDW: 14.9 % (ref 11.5–15.5)
WBC: 8.4 10*3/uL (ref 4.0–10.5)
nRBC: 0 % (ref 0.0–0.2)

## 2018-12-05 LAB — BASIC METABOLIC PANEL
Anion gap: 9 (ref 5–15)
BUN: 18 mg/dL (ref 8–23)
CO2: 29 mmol/L (ref 22–32)
Calcium: 10.2 mg/dL (ref 8.9–10.3)
Chloride: 100 mmol/L (ref 98–111)
Creatinine, Ser: 0.82 mg/dL (ref 0.44–1.00)
GFR calc Af Amer: >60 mL/min (ref >60)
GFR calc non Af Amer: >60 mL/min (ref >60)
Glucose, Bld: 138 mg/dL — ABNORMAL HIGH (ref 70–99)
Potassium: 3.3 mmol/L — ABNORMAL LOW (ref 3.5–5.1)
Sodium: 138 mmol/L (ref 135–145)

## 2018-12-05 LAB — MAGNESIUM: Magnesium: 1.8 mg/dL (ref 1.7–2.4)

## 2018-12-05 MED ORDER — MAGNESIUM SULFATE 2 GM/50ML IV SOLN
2.0000 g | Freq: Once | INTRAVENOUS | Status: AC
  Administered 2018-12-05: 2 g via INTRAVENOUS
  Filled 2018-12-05: qty 50

## 2018-12-05 MED ORDER — TICAGRELOR 90 MG PO TABS: 90.0000 mg | ORAL_TABLET | Freq: Two times a day (BID) | ORAL | 1 refills | Status: DC

## 2018-12-05 MED ORDER — ROSUVASTATIN CALCIUM 40 MG PO TABS: 40.0000 mg | ORAL_TABLET | Freq: Every day | ORAL | 1 refills | Status: DC

## 2018-12-05 MED ORDER — METOPROLOL TARTRATE 25 MG PO TABS
25.0000 mg | ORAL_TABLET | Freq: Two times a day (BID) | ORAL | Status: DC
  Administered 2018-12-05: 25 mg via ORAL
  Filled 2018-12-05: qty 1

## 2018-12-05 MED ORDER — POTASSIUM CHLORIDE CRYS ER 20 MEQ PO TBCR
40.0000 meq | EXTENDED_RELEASE_TABLET | Freq: Once | ORAL | Status: AC
  Administered 2018-12-05: 40 meq via ORAL
  Filled 2018-12-05: qty 2

## 2018-12-05 MED ORDER — ASPIRIN 81 MG PO CHEW: 81.0000 mg | CHEWABLE_TABLET | Freq: Every day | ORAL | Status: AC

## 2018-12-05 MED ORDER — ENALAPRIL MALEATE 2.5 MG PO TABS
2.5000 mg | ORAL_TABLET | Freq: Every day | ORAL | Status: DC
  Administered 2018-12-05: 2.5 mg via ORAL
  Filled 2018-12-05: qty 1

## 2018-12-05 MED ORDER — ROSUVASTATIN CALCIUM 10 MG PO TABS: 40.0000 mg | ORAL_TABLET | Freq: Every day | ORAL | Status: DC

## 2018-12-05 MED ORDER — METOPROLOL TARTRATE 25 MG PO TABS: 25.0000 mg | ORAL_TABLET | Freq: Two times a day (BID) | ORAL | 1 refills | Status: DC

## 2018-12-05 MED ORDER — ENALAPRIL MALEATE 2.5 MG PO TABS: 2.5000 mg | ORAL_TABLET | Freq: Every day | ORAL | 1 refills | Status: DC

## 2018-12-05 NOTE — Progress Notes (Signed)
Kimberly Burke to be D/C'd Home per MD order.  Discussed prescriptions and follow up appointments with the patient. Prescriptions given to patient, medication list explained in detail. Pt verbalized understanding. Vascular instructions given to patient, brilinta coupon given to patient  Allergies as of 12/05/2018      Reactions   Codeine Itching   Penicillins Other (See Comments)   unknown      Medication List    STOP taking these medications   atenolol 100 MG tablet Commonly known as: TENORMIN     TAKE these medications   acetaminophen 650 MG CR tablet Commonly known as: TYLENOL Take 1,300 mg by mouth daily.   aspirin 81 MG chewable tablet Chew 1 tablet (81 mg total) by mouth daily. Start taking on: December 06, 2018   DULoxetine 30 MG capsule Commonly known as: CYMBALTA Take 30 mg by mouth daily.   enalapril 2.5 MG tablet Commonly known as: VASOTEC Take 1 tablet (2.5 mg total) by mouth daily. Start taking on: December 06, 2018   fluticasone 50 MCG/ACT nasal spray Commonly known as: FLONASE Place 1 spray into both nostrils daily.   hydrochlorothiazide 25 MG tablet Commonly known as: HYDRODIURIL Take 1 tablet by mouth daily.   hydroxychloroquine 200 MG tablet Commonly known as: PLAQUENIL Take 2 tablets by mouth daily.   levothyroxine 50 MCG tablet Commonly known as: SYNTHROID Take 1 tablet by mouth daily.   methotrexate 2.5 MG tablet Commonly known as: RHEUMATREX Take 15 mg by mouth once a week.   metoprolol tartrate 25 MG tablet Commonly known as: LOPRESSOR Take 1 tablet (25 mg total) by mouth 2 (two) times daily. Notes to patient: Take 2nd dose this afternoon   rosuvastatin 40 MG tablet Commonly known as: CRESTOR Take 1 tablet (40 mg total) by mouth daily at 6 PM.   ticagrelor 90 MG Tabs tablet Commonly known as: BRILINTA Take 1 tablet (90 mg total) by mouth 2 (two) times daily. Start taking on: December 06, 2018 Notes to patient: Take 2nd dose  this afternoon       Vitals:   12/05/18 0754 12/05/18 1101  BP: 127/84 134/73  Pulse: 70 80  Resp: 19   Temp: 98.1 F (36.7 C)   SpO2: 98%     Skin clean, dry and intact without evidence of skin break down, no evidence of skin tears noted. IV catheter discontinued intact. Site without signs and symptoms of complications. Dressing and pressure applied. Pt denies pain at this time. No complaints noted.  An After Visit Summary was printed and given to the patient. Patient escorted via [place], and D/C home via private auto.  [place]

## 2018-12-05 NOTE — Discharge Summary (Signed)
Physician Discharge Summary  Kimberly Burke [id number] DOB: 26-Feb-1940 DOA: 12/03/2018  PCP: [facility] [place] date: 12/03/2018 Discharge date: 12/05/2018  Time spent: 55 minutes  Recommendations for Outpatient Follow-up:  1. Follow-up with Dr. Humphrey Rolls, cardiology as scheduled on 12/07/2018 2. Follow-up with [facility] in 2 weeks.  On follow-up patient need a basic metabolic profile done to follow-up on electrolytes and renal function.  Patient's blood pressure also need to be reassessed at that time.   Discharge Diagnoses:  Principal Problem:   NSTEMI (non-ST elevated myocardial infarction) [facility]) Active Problems:   Hypothyroid   Essential hypertension   Rheumatoid arthritis ([place])   Discharge Condition: Stable and improved  Diet recommendation: Heart healthy  Filed Weights   12/03/18 1638 12/04/18 0945 12/05/18 0435  Weight: 81.6 kg 81.6 kg 86.5 kg    History of present illness:  Per Dr. Blima Rich is a 78 y.o. female with medical history significant of hypertension, chronic kidney disease, hypothyroidism, rheumatoid arthritis on immunosuppressive's who presented with concerns of left-sided chest pain for the past 3 days.  Patient reported that about 3 days prior to admission, she started to notice on and off left-sided chest pain that wrapped around towards her back as well as go up her neck.  The pain was dull and it was intermittent lasting 5 minutes at a time.  It can occur when she is asleep or with exertion.  She feels slightly better if there is something underneath her left shoulder.  She also endorsed being diaphoretic several times during these chest pain episodes.  However denies any shortness of breath.  No nausea vomiting or diarrhea.No fever or chills. No new cough. She denied any tobacco use or illicit drug use.  Has occasional alcohol use. No family history of any cardiac issues.  ED Course: She was  afebrile and normotensive on room air.  CBC showed no leukocytosis or anemia.  CMP showed normal creatinine, mildly elevated AST of 52 and ALT of 57.  Initial troponin of 1506 and increased to 1706. EKG shows first-degree AV block but no ST elevation or depression. Chest x-ray shows findings of COPD with no other acute cardiac process.  Hospital Course:  1 non-STEMI Patient had presented with typical chest pain radiating to the neck and back.  Patient was admitted for chest pain rule out.  Cardiac enzymes which were obtained were elevated and patient was ruled in.  Cardiology was consulted and patient placed on a heparin drip as well as nitro paste.  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  Drug-eluting stent was placed and patient started on dual antiplatelet therapy.  Patient subsequently started on Crestor 40 mg daily, Metroprolol tartrate 25 mg twice daily, enalapril 2.5 mg daily per cardiology recommendations.  Patient improved clinically.  Patient did not have any further chest pain.  And patient was deemed stable from cardiac perspective for discharge.  Patient was discharged home in stable and improved condition with close outpatient follow-up with cardiology.  2.  Hypertension Patient maintained on HCTZ.  Metoprolol and enalapril were added to patient's regimen secondary to problem #1.  Outpatient follow-up.  3.  Hypothyroidism Remained stable.  Patient maintained on home regimen of Synthroid.  4.  Rheumatoid arthritis Patient is immunosuppressive medications were held during the hospitalization and resumed on discharge.  Procedures:  Cardiac catheterization 12/04/2018--proximal RCA lesion 75 stenosed, status post DES  successfully placed using stent resolute Onyx 3.0X 18, post intervention there is 0% residual stenosis.  Chest x-ray 12/03/2018  Consultations:  Cardiology: Dr. Humphrey Rolls 12/04/2018  Discharge  Exam: Vitals:   12/05/18 0754 12/05/18 1101  BP: 127/84 134/73  Pulse: 70 80  Resp: 19   Temp: 98.1 F (36.7 C)   SpO2: 98%     General: NAD Cardiovascular: RRR Respiratory: CTAB  Discharge Instructions   Discharge Instructions    AMB Referral to Cardiac Rehabilitation - Phase II   Complete by: As directed    Diagnosis: Coronary Stents   After initial evaluation and assessments completed: Virtual Based Care may be provided alone or in conjunction with Phase 2 Cardiac Rehab based on patient barriers.: Yes   Amb Referral to Cardiac Rehabilitation   Complete by: As directed    Diagnosis:  NSTEMI Coronary Stents     After initial evaluation and assessments completed: Virtual Based Care may be provided alone or in conjunction with Phase 2 Cardiac Rehab based on patient barriers.: Yes   Diet - low sodium heart healthy   Complete by: As directed    Increase activity slowly   Complete by: As directed      Allergies as of 12/05/2018      Reactions   Codeine Itching   Penicillins Other (See Comments)   unknown      Medication List    STOP taking these medications   atenolol 100 MG tablet Commonly known as: TENORMIN     TAKE these medications   acetaminophen 650 MG CR tablet Commonly known as: TYLENOL Take 1,300 mg by mouth daily.   aspirin 81 MG chewable tablet Chew 1 tablet (81 mg total) by mouth daily. Start taking on: December 06, 2018   DULoxetine 30 MG capsule Commonly known as: CYMBALTA Take 30 mg by mouth daily.   enalapril 2.5 MG tablet Commonly known as: VASOTEC Take 1 tablet (2.5 mg total) by mouth daily. Start taking on: December 06, 2018   fluticasone 50 MCG/ACT nasal spray Commonly known as: FLONASE Place 1 spray into both nostrils daily.   hydrochlorothiazide 25 MG tablet Commonly known as: HYDRODIURIL Take 1 tablet by mouth daily.   hydroxychloroquine 200 MG tablet Commonly known as: PLAQUENIL Take 2 tablets by mouth daily.    levothyroxine 50 MCG tablet Commonly known as: SYNTHROID Take 1 tablet by mouth daily.   methotrexate 2.5 MG tablet Commonly known as: RHEUMATREX Take 15 mg by mouth once a week.   metoprolol tartrate 25 MG tablet Commonly known as: LOPRESSOR Take 1 tablet (25 mg total) by mouth 2 (two) times daily.   rosuvastatin 40 MG tablet Commonly known as: CRESTOR Take 1 tablet (40 mg total) by mouth daily at 6 PM.   ticagrelor 90 MG Tabs tablet Commonly known as: BRILINTA Take 1 tablet (90 mg total) by mouth 2 (two) times daily. Start taking on: December 06, 2018      Allergies  Allergen Reactions  . Codeine Itching  . Penicillins Other (See Comments)    unknown   Follow-up Information    [facility] Cardiac and Pulmonary Rehab Follow up.   Specialty: Cardiac Rehabilitation Why: Your cardiologist has referred you to outpatient Cardiac Rehab at [facility].  The Cardiac Rehab department will contact you by phone within one to two weeks after discharge to schedule your first appointment.   Contact information: [place] [id number] [place]  [employer] Schedule an appointment as soon as possible for a visit in 2 week(s).   Specialty: General Practice Contact information: [place] [address] [phone]        Dionisio David, MD Follow up on 12/07/2018.   Specialty: Cardiology Why: f/u as scheduled. Contact information: [place] [address] [phone]            The results of significant diagnostics from this hospitalization (including imaging, microbiology, ancillary and laboratory) are listed below for reference.    Significant Diagnostic Studies: Dg Chest 2 View  Result Date: 12/03/2018 CLINICAL DATA:  Chest pain EXAM: CHEST - 2 VIEW COMPARISON:  February 02, 2017 FINDINGS: The heart size is stable from prior study. Aortic calcifications  are noted. There is blunting of the costophrenic angles bilaterally similar to prior study. Again noted are findings of COPD without evidence for a focal infiltrate. Again noted are prominent interstitial lung markings. IMPRESSION: Stable exam. Again noted are findings of COPD without evidence for a focal infiltrate. Prominent interstitial lung markings may be secondary to edema or interstitial lung disease. Electronically Signed   By: Constance Holster M.D.   On: 12/03/2018 17:11    Microbiology: Recent Results (from the past 240 hour(s))  SARS CORONAVIRUS 2 (TAT 6-24 HRS) Nasopharyngeal Nasopharyngeal Swab     Status: None   Collection Time: 12/03/18  9:05 PM   Specimen: Nasopharyngeal Swab  Result Value Ref Range Status   SARS Coronavirus 2 NEGATIVE NEGATIVE Final    Comment: (NOTE) SARS-CoV-2 target nucleic acids are NOT DETECTED. The SARS-CoV-2 RNA is generally detectable in upper and lower respiratory specimens during the acute phase of infection. Negative results do not preclude SARS-CoV-2 infection, do not rule out co-infections with other pathogens, and should not be used as the sole basis for treatment or other patient management decisions. Negative results must be combined with clinical observations, patient history, and epidemiological information. The expected result is Negative. Fact Sheet for Patients: SugarRoll.be Fact Sheet for Healthcare Providers: https://www.woods-mathews.com/ This test is not yet approved or cleared by the [place] FDA and  has been authorized for detection and/or diagnosis of SARS-CoV-2 by FDA under an Emergency Use Authorization (EUA). This EUA will remain  in effect (meaning this test can be used) for the duration of the COVID-19 declaration under Section 56 4(b)(1) of the Act, 21 U.S.C. section 360bbb-3(b)(1), unless the authorization is terminated or revoked sooner. Performed at [facility], [place] [address], [place], [place] [address]      Labs: Basic Metabolic Panel: Recent Labs  Lab 12/03/18 1647 12/05/18 0919  NA 138 138  K 3.7 3.3*  CL 103 100  CO2 26 29  GLUCOSE 92 138*  BUN 15 18  CREATININE 0.81 0.82  CALCIUM 10.1 10.2  MG  --  1.8   Liver Function Tests: Recent Labs  Lab 12/03/18 1647  AST 52*  ALT 57*  ALKPHOS 72  BILITOT 0.7  PROT 8.3*  ALBUMIN 4.0   No results for input(s): LIPASE, AMYLASE in the last 168 hours. No results for input(s): AMMONIA in the last 168 hours. CBC: Recent Labs  Lab 12/03/18 1647 12/04/18 1454 12/05/18 0919  WBC 8.6 8.8 8.4  HGB 12.9 13.1 13.3  HCT 38.1 39.0 38.5  MCV 94.3 95.8 93.7  PLT 231 220 209   Cardiac Enzymes: No results for input(s): CKTOTAL, CKMB, CKMBINDEX, TROPONINI in the last 168 hours. BNP:  BNP (last 3 results) No results for input(s): BNP in the last 8760 hours.  ProBNP (last 3 results) No results for input(s): PROBNP in the last 8760 hours.  CBG: No results for input(s): GLUCAP in the last 168 hours.     Signed:  Irine Seal MD.  [facility] 12/05/2018, 12:43 PM

## 2018-12-05 NOTE — Progress Notes (Signed)
SUBJECTIVE: S/P cardiac cath. Feeling well. No chest pain or shortness of breath.   Vitals:   12/04/18 1453 12/04/18 1918 12/05/18 0435 12/05/18 0754  BP: 120/79 129/78 113/77 127/84  Pulse: 64 73 74 70  Resp:  16 20 19   Temp:  98.1 F (36.7 C) 98.3 F (36.8 C) 98.1 F (36.7 C)  TempSrc:  Oral Oral   SpO2: 100% 95% 95% 98%  Weight:   86.5 kg   Height:        Intake/Output Summary (Last 24 hours) at 12/05/2018 1044 Last data filed at 12/05/2018 0933 Gross per 24 hour  Intake 765.63 ml  Output 425 ml  Net 340.63 ml    LABS: Basic Metabolic Panel: Recent Labs    12/03/18 1647 12/05/18 0919  NA 138 138  K 3.7 3.3*  CL 103 100  CO2 26 29  GLUCOSE 92 138*  BUN 15 18  CREATININE 0.81 0.82  CALCIUM 10.1 10.2  MG  --  1.8   Liver Function Tests: Recent Labs    12/03/18 1647  AST 52*  ALT 57*  ALKPHOS 72  BILITOT 0.7  PROT 8.3*  ALBUMIN 4.0   No results for input(s): LIPASE, AMYLASE in the last 72 hours. CBC: Recent Labs    12/04/18 1454 12/05/18 0919  WBC 8.8 8.4  HGB 13.1 13.3  HCT 39.0 38.5  MCV 95.8 93.7  PLT 220 209   Cardiac Enzymes: No results for input(s): CKTOTAL, CKMB, CKMBINDEX, TROPONINI in the last 72 hours. BNP: Invalid input(s): POCBNP D-Dimer: No results for input(s): DDIMER in the last 72 hours. Hemoglobin A1C: No results for input(s): HGBA1C in the last 72 hours. Fasting Lipid Panel: No results for input(s): CHOL, HDL, LDLCALC, TRIG, CHOLHDL, LDLDIRECT in the last 72 hours. Thyroid Function Tests: No results for input(s): TSH, T4TOTAL, T3FREE, THYROIDAB in the last 72 hours.  Invalid input(s): FREET3 Anemia Panel: No results for input(s): VITAMINB12, FOLATE, FERRITIN, TIBC, IRON, RETICCTPCT in the last 72 hours.   PHYSICAL EXAM General: Well developed, well nourished, in no acute distress HEENT:  Normocephalic and atramatic Neck:  No JVD.  Lungs: Clear bilaterally to auscultation and percussion. Heart: HRRR . Normal S1  and S2 without gallops or murmurs.  Abdomen: Bowel sounds are positive, abdomen soft and non-tender  Msk:  Back normal, normal gait. Normal strength and tone for age. Extremities: No clubbing, cyanosis or edema. Right groin cath site clean dry and intact.     Neuro: Alert and oriented X 3. Psych:  Good affect, responds appropriately  TELEMETRY: NSR 82bpm  ASSESSMENT AND PLAN: S/P PCI with DES to RCA yesterday: Feeling well, advise starting crestor 40mg , metoprolol tart 25mg  BID, and enalapril 2.5mg . May discharge today from cardiac perspective with outpatient follow up Friday 10am.    Principal Problem:   NSTEMI (non-ST elevated myocardial infarction) [facility]) Active Problems:   Hypothyroid   Essential hypertension   Rheumatoid arthritis ([place])    Jake Bathe, NP-C 12/05/2018 10:44 AM Cell: [phone]

## 2018-12-05 NOTE — Progress Notes (Signed)
Cardiovascular and Pulmonary Nurse Navigator Note:   Kimberly Burke is a 78 y.o. female with medical history significant of hypertension, chronic kidney disease, hypothyroidism, rheumatoid arthritis on immunosuppressive's who presents with concerns of left-sided chest pain for the past 3 days.  Patient ruled in for NSTEMI.     [place] Order# [id number] Reading physician: Yolonda Kida, MD Ordering physician: Dionisio David, MD Study date: 12/04/18  Physicians  Panel Physicians Referring Physician Case Authorizing Physician  Yolonda Kida, MD (Primary)  Dionisio David, MD  Procedures  CORONARY STENT INTERVENTION  Conclusion    Prox RCA lesion is 75% stenosed.  A drug-eluting stent was successfully placed using a STENT RESOLUTE ONYX 3.0X18.  Post intervention, there is a 0% residual stenosis.   Conclusion Successful PCI and direct stent to proximal RCA with DES 3.0 x 18 mm Onyx to 14 atm Post dilation with 3.25 x 15 mm [place] trek to 20 atm Lesion reduced from 75 down to 0% TIMI-3 flow was maintained throughout Patient to maintain Plavix and aspirin for at least 12 months Angiomax at reduced rate for an additional 2 hours Heparin should be discontinued Cardiology management transferred back to Dr. Yancey Flemings    EDUCATION:   "Heart Attack Bouncing Back" booklet given and reviewed with patient. Discussed the definition of CAD. Reviewed the location of CAD and where  her stent was placed. Informed patient  she will be given a stent card. Explained the purpose of the stent card. Instructed patient to keep stent card in her wallet.  ? Discussed modifiable risk factors including controlling blood pressure, cholesterol, and blood sugar; following heart healthy diet; maintaining healthy weight; exercise; and smoking cessation, if applicable.   ? Discussed cardiac medications including rationale for taking, mechanisms of action, and side effects.  Stressed the importance of taking medications as prescribed.  ? Discussed emergency plan for heart attack symptoms. Patient verbalized understanding of need to call 911 and not to drive herself to ER if having cardiac symptoms / chest pain.    ? Diet of low sodium, low fat, low cholesterol heart healthy diet discussed. Information on diet provided.  ? Smoking Cessation - Patient is a FORMER smoker.    ? Exercise - Benefits of exercised discussed. Patient has not been exercising.   Informed patient her cardiologist has referred her to outpatient Cardiac Rehab. An overview of the program was provided. Informational letter with CPT billing codes given to patient. Patient is interested in participating, but would like to check with her insurance company to see what her out of pocket expenses are prior to scheduling her first appointment. Patient is agreeable to being contacted by phone in one week to discuss her first appointment.  Barrier to participating:  Possibly her Rheumatoid Arthritis.    Patient appreciative of the information.  ? Roanna Epley, RN, BSN, [place]  [facility] Cardiac & Pulmonary Rehab  Cardiovascular & Pulmonary Nurse Navigator  Direct Line: [phone]  Department Phone #: [phone] Fax: [phone]  Email Address: [email or web address]

## 2018-12-05 NOTE — Clinical Social Work Note (Addendum)
Patient is a new Associate Professor patient, bedside nurse was given the Brilenta coupon for a free 30 day supply.  CSW signing off, patient did not express any other concerns about returning back home.  Jones Broom. [place], MSW, LCSW [phone]  12/05/2018 2:48 PM

## 2018-12-13 ENCOUNTER — Emergency Department
Admission: EM | Admit: 2018-12-13 | Discharge: 2018-12-13 | Disposition: A | Discharge status: Home / Self Care | Payer: Medicare Other | Attending: Student | Admitting: Student

## 2018-12-13 ENCOUNTER — Other Ambulatory Visit: Payer: Self-pay

## 2018-12-13 ENCOUNTER — Encounter: Payer: Self-pay | Admitting: Emergency Medicine

## 2018-12-13 ENCOUNTER — Emergency Department: Payer: Medicare Other

## 2018-12-13 DIAGNOSIS — Z79899 Other long term (current) drug therapy: Secondary | ICD-10-CM | POA: Diagnosis not present

## 2018-12-13 DIAGNOSIS — R059 Cough, unspecified: Secondary | ICD-10-CM

## 2018-12-13 DIAGNOSIS — J45909 Unspecified asthma, uncomplicated: Secondary | ICD-10-CM | POA: Insufficient documentation

## 2018-12-13 DIAGNOSIS — I10 Essential (primary) hypertension: Secondary | ICD-10-CM | POA: Diagnosis not present

## 2018-12-13 DIAGNOSIS — Z87891 Personal history of nicotine dependence: Secondary | ICD-10-CM | POA: Insufficient documentation

## 2018-12-13 DIAGNOSIS — R05 Cough: Secondary | ICD-10-CM | POA: Insufficient documentation

## 2018-12-13 DIAGNOSIS — E039 Hypothyroidism, unspecified: Secondary | ICD-10-CM | POA: Diagnosis not present

## 2018-12-13 LAB — CBC WITH DIFFERENTIAL/PLATELET
Abs Immature Granulocytes: 0.05 10*3/uL (ref 0.00–0.07)
Basophils Absolute: 0.1 10*3/uL (ref 0.0–0.1)
Basophils Relative: 1 %
Eosinophils Absolute: 0.3 10*3/uL (ref 0.0–0.5)
Eosinophils Relative: 3 %
HCT: 38.3 % (ref 36.0–46.0)
Hemoglobin: 13.1 g/dL (ref 12.0–15.0)
Immature Granulocytes: 1 %
Lymphocytes Relative: 17 %
Lymphs Abs: 1.4 10*3/uL (ref 0.7–4.0)
MCH: 31.9 pg (ref 26.0–34.0)
MCHC: 34.2 g/dL (ref 30.0–36.0)
MCV: 93.2 fL (ref 80.0–100.0)
Monocytes Absolute: 1.4 10*3/uL — ABNORMAL HIGH (ref 0.1–1.0)
Monocytes Relative: 16 %
Neutro Abs: 5.5 10*3/uL (ref 1.7–7.7)
Neutrophils Relative %: 62 %
Platelets: 311 10*3/uL (ref 150–400)
RBC: 4.11 MIL/uL (ref 3.87–5.11)
RDW: 14.5 % (ref 11.5–15.5)
WBC: 8.7 10*3/uL (ref 4.0–10.5)
nRBC: 0 % (ref 0.0–0.2)

## 2018-12-13 LAB — COMPREHENSIVE METABOLIC PANEL
ALT: 33 U/L (ref 0–44)
AST: 27 U/L (ref 15–41)
Albumin: 3.9 g/dL (ref 3.5–5.0)
Alkaline Phosphatase: 88 U/L (ref 38–126)
Anion gap: 11 (ref 5–15)
BUN: 16 mg/dL (ref 8–23)
CO2: 29 mmol/L (ref 22–32)
Calcium: 10.4 mg/dL — ABNORMAL HIGH (ref 8.9–10.3)
Chloride: 96 mmol/L — ABNORMAL LOW (ref 98–111)
Creatinine, Ser: 1.07 mg/dL — ABNORMAL HIGH (ref 0.44–1.00)
GFR calc Af Amer: 58 mL/min — ABNORMAL LOW (ref >60)
GFR calc non Af Amer: 50 mL/min — ABNORMAL LOW (ref >60)
Glucose, Bld: 100 mg/dL — ABNORMAL HIGH (ref 70–99)
Potassium: 3.5 mmol/L (ref 3.5–5.1)
Sodium: 136 mmol/L (ref 135–145)
Total Bilirubin: 1 mg/dL (ref 0.3–1.2)
Total Protein: 8.7 g/dL — ABNORMAL HIGH (ref 6.5–8.1)

## 2018-12-13 LAB — TROPONIN I (HIGH SENSITIVITY)
Troponin I (High Sensitivity): 8 ng/L (ref <18)
Troponin I (High Sensitivity): 9 ng/L (ref <18)

## 2018-12-13 MED ORDER — FLUTICASONE PROPIONATE 50 MCG/ACT NA SUSP: 1.0000 | Freq: Every day | NASAL | 0 refills | Status: DC

## 2018-12-13 MED ORDER — CETIRIZINE HCL 10 MG PO TABS: 10.0000 mg | ORAL_TABLET | Freq: Every day | ORAL | 0 refills | Status: DC

## 2018-12-13 NOTE — ED Provider Notes (Signed)
[facility] Emergency Department Provider Note  ____________________________________________   First MD Initiated Contact with Patient 12/13/18 1410     (approximate)  I have reviewed the triage vital signs and the nursing notes.  History  Chief Complaint Cough    HPI Kimberly Burke is a 78 y.o. female with hx of RA, HTN, recent NSTEMI s/p stenting on 12/04/18 who presents for cough. Patient states she has had a cough for several months now, even before her recent stenting, but she feels like it has become more frequent and bothersome recently. Cough is dry. No hemoptysis. She does report post nasal drip. She denies fevers, chest pain, SOB, or leg swelling. She is not on lisinopril. She denies any sick contacts or exposure to [place]. She was not intubated for her cath.    Past Medical Hx Past Medical History:  Diagnosis Date  . Arthritis   . Hypertension   . Thyroid disease     Problem List Patient Active Problem List   Diagnosis Date Noted  . NSTEMI (non-ST elevated myocardial infarction) ([place]) 12/03/2018  . Hypothyroid 12/03/2018  . Essential hypertension 12/03/2018  . Rheumatoid arthritis ([place]) 12/03/2018    Past Surgical Hx Past Surgical History:  Procedure Laterality Date  . CORONARY STENT INTERVENTION N/A 12/04/2018   Procedure: CORONARY STENT INTERVENTION;  Surgeon: Yolonda Kida, MD;  Location: [place] [facility];  Service: Cardiovascular;  Laterality: N/A;  . LEFT HEART CATH AND CORONARY ANGIOGRAPHY N/A 12/04/2018   Procedure: LEFT HEART CATH AND CORONARY ANGIOGRAPHY;  Surgeon: Dionisio David, MD;  Location: [place] [facility];  Service: Cardiovascular;  Laterality: N/A;    Medications Prior to Admission medications   Medication Sig Start Date End Date Taking? Authorizing Provider  acetaminophen (TYLENOL) 650 MG CR tablet Take 1,300 mg by mouth daily.     [provider]  aspirin 81 MG chewable tablet Chew 1  tablet (81 mg total) by mouth daily. 12/06/18   Eugenie Filler, MD  DULoxetine (CYMBALTA) 30 MG capsule Take 30 mg by mouth daily. 12/03/18   [provider]  enalapril (VASOTEC) 2.5 MG tablet Take 1 tablet (2.5 mg total) by mouth daily. 12/06/18   Eugenie Filler, MD  fluticasone (FLONASE) 50 MCG/ACT nasal spray Place 1 spray into both nostrils daily. 05/10/17   [provider]  hydrochlorothiazide (HYDRODIURIL) 25 MG tablet Take 1 tablet by mouth daily. 05/10/17   [provider]  hydroxychloroquine (PLAQUENIL) 200 MG tablet Take 2 tablets by mouth daily.  06/06/17   [provider]  levothyroxine (SYNTHROID, LEVOTHROID) 50 MCG tablet Take 1 tablet by mouth daily. 05/10/17   [provider]  methotrexate (RHEUMATREX) 2.5 MG tablet Take 15 mg by mouth once a week. 09/25/18   [provider]  metoprolol tartrate (LOPRESSOR) 25 MG tablet Take 1 tablet (25 mg total) by mouth 2 (two) times daily. 12/05/18   Eugenie Filler, MD  rosuvastatin (CRESTOR) 40 MG tablet Take 1 tablet (40 mg total) by mouth daily at 6 PM. 12/05/18   Eugenie Filler, MD  ticagrelor (BRILINTA) 90 MG TABS tablet Take 1 tablet (90 mg total) by mouth 2 (two) times daily. 12/06/18   Eugenie Filler, MD    Allergies Codeine and Penicillins  Family Hx Family History  Problem Relation Age of Onset  . Cancer Brother        prostate or colon unsure which   . Breast cancer Sister 31  Social Hx Social History   Tobacco Use  . Smoking status: Former Research scientist (life sciences)  . Smokeless tobacco: Never Used  Substance Use Topics  . Alcohol use: Never    Frequency: Never  . Drug use: Never     Review of Systems  Constitutional: Negative for fever, chills. Eyes: Negative for visual changes. ENT: Negative for sore throat. Cardiovascular: Negative for chest pain. Respiratory: Negative for shortness of breath. + cough Gastrointestinal: Negative for nausea, vomiting.   Genitourinary: Negative for dysuria. Musculoskeletal: Negative for leg swelling. Skin: Negative for rash. Neurological: Negative for for headaches.   Physical Exam  Vital Signs: ED Triage Vitals  Enc Vitals Group     BP 12/13/18 1251 106/81     Pulse Rate 12/13/18 1251 73     Resp 12/13/18 1251 18     Temp 12/13/18 1251 98.6 F (37 C)     Temp Source 12/13/18 1251 Oral     SpO2 12/13/18 1251 97 %     Weight 12/13/18 1252 190 lb 11.2 oz (86.5 kg)     Height 12/13/18 1252 5\' 2"  (1.575 m)     Head Circumference --      Peak Flow --      Pain Score 12/13/18 1252 0     Pain Loc --      Pain Edu? --      Excl. in [place]? --     Constitutional: Alert and oriented.  Head: Normocephalic. Atraumatic. Eyes: Conjunctivae clear. Sclera anicteric. Nose: No congestion. No rhinorrhea. Mouth/Throat: Wearing mask. Mild cobblestoning of posterior OP. Neck: No stridor.   Cardiovascular: Normal rate, regular rhythm. Extremities well perfused. Respiratory: Normal respiratory effort.  Lungs CTAB. Dry cough during exam. Gastrointestinal: Soft. Non-tender. Non-distended.  Musculoskeletal: No lower extremity edema. No deformities. Neurologic:  Normal speech and language. No gross focal neurologic deficits are appreciated.  Skin: Skin is warm, dry and intact. Expected bruising in groin area from recent cath, well appearing.  Psychiatric: Mood and affect are appropriate for situation.  Radiology  XR:  IMPRESSION:  Cardiomegaly. No acute pulmonary process.    Procedures  Procedure(s) performed (including critical care):  Procedures   Initial Impression / Assessment and Plan / ED Course  78 y.o. female who presents to the ED for chronic cough.   Suspect likely related to postnasal drip. Not on lisinopril. No evidence of infection on XR. Consider COVID given recent hospitalization but patient adamantly refuses retesting. Given blood work and XR are w/o acute actionable derangements, will  plan for DC with Flonase, cetirizine and outpatient follow up. She is agreeable with plan. Given return precautions.    Final Clinical Impression(s) / ED Diagnosis  Final diagnoses:  Cough       Note:  This document was prepared using Dragon voice recognition software and may include unintentional dictation errors.   Lilia Pro., MD 12/13/18 Bosie Helper

## 2018-12-13 NOTE — Discharge Instructions (Signed)
Thank you for letting us take care of you in the emergency department today.   Please continue to take any regular, prescribed medications.   New medications we have prescribed:  - Flonase nasal spray - Cetirizine  Please follow up with: - Your primary care doctor to review your ER visit and follow up on your symptoms.   Please return to the ER for any new or worsening symptoms.

## 2018-12-13 NOTE — ED Triage Notes (Signed)
Pt states was seen "last Monday with a heart attack", states is here today due to dry cough. Pt states she feels like dry cough is getting worse since her heart cath. Pt states has been taking OTC cough drops without relief.   Pt states coughing is starting to cough pain to her incision site to R groin.

## 2018-12-13 NOTE — ED Notes (Signed)
Spoke with Dr. Joan Mayans regarding care, see orders.

## 2018-12-17 LAB — MATURITY ONSET DIABETES OF THE YOUNG(MODY)GENETIC PROFILE

## 2018-12-28 ENCOUNTER — Other Ambulatory Visit: Payer: Self-pay | Admitting: Family

## 2018-12-28 DIAGNOSIS — Z1231 Encounter for screening mammogram for malignant neoplasm of breast: Secondary | ICD-10-CM

## 2019-01-03 ENCOUNTER — Ambulatory Visit
Admission: RE | Admit: 2019-01-03 | Discharge: 2019-01-03 | Disposition: A | Discharge status: Home / Self Care | Payer: Medicare Other | Source: Ambulatory Visit | Attending: Family | Admitting: Family

## 2019-01-03 DIAGNOSIS — Z1231 Encounter for screening mammogram for malignant neoplasm of breast: Secondary | ICD-10-CM | POA: Diagnosis present

## 2019-10-18 ENCOUNTER — Other Ambulatory Visit: Payer: Self-pay | Admitting: Internal Medicine

## 2019-10-18 DIAGNOSIS — Z1231 Encounter for screening mammogram for malignant neoplasm of breast: Secondary | ICD-10-CM

## 2020-01-06 ENCOUNTER — Other Ambulatory Visit: Payer: Self-pay

## 2020-01-06 ENCOUNTER — Ambulatory Visit
Admission: RE | Admit: 2020-01-06 | Discharge: 2020-01-06 | Disposition: A | Discharge status: Home / Self Care | Payer: Medicare Other | Source: Ambulatory Visit | Attending: Internal Medicine | Admitting: Internal Medicine

## 2020-01-06 DIAGNOSIS — Z1231 Encounter for screening mammogram for malignant neoplasm of breast: Secondary | ICD-10-CM | POA: Diagnosis present

## 2020-04-30 DIAGNOSIS — I10 Essential (primary) hypertension: Secondary | ICD-10-CM | POA: Diagnosis not present

## 2020-04-30 DIAGNOSIS — M546 Pain in thoracic spine: Secondary | ICD-10-CM | POA: Diagnosis not present

## 2020-04-30 DIAGNOSIS — E039 Hypothyroidism, unspecified: Secondary | ICD-10-CM | POA: Diagnosis not present

## 2020-04-30 DIAGNOSIS — I2510 Atherosclerotic heart disease of native coronary artery without angina pectoris: Secondary | ICD-10-CM | POA: Diagnosis not present

## 2020-04-30 DIAGNOSIS — E782 Mixed hyperlipidemia: Secondary | ICD-10-CM | POA: Diagnosis not present

## 2020-05-19 DIAGNOSIS — Z79899 Other long term (current) drug therapy: Secondary | ICD-10-CM | POA: Diagnosis not present

## 2020-05-19 DIAGNOSIS — M069 Rheumatoid arthritis, unspecified: Secondary | ICD-10-CM | POA: Diagnosis not present

## 2020-05-22 DIAGNOSIS — I219 Acute myocardial infarction, unspecified: Secondary | ICD-10-CM | POA: Diagnosis not present

## 2020-05-22 DIAGNOSIS — M545 Low back pain, unspecified: Secondary | ICD-10-CM | POA: Diagnosis not present

## 2020-05-22 DIAGNOSIS — E782 Mixed hyperlipidemia: Secondary | ICD-10-CM | POA: Diagnosis not present

## 2020-05-22 DIAGNOSIS — Z112 Encounter for screening for other bacterial diseases: Secondary | ICD-10-CM | POA: Diagnosis not present

## 2020-05-22 DIAGNOSIS — I10 Essential (primary) hypertension: Secondary | ICD-10-CM | POA: Diagnosis not present

## 2020-05-22 DIAGNOSIS — R0602 Shortness of breath: Secondary | ICD-10-CM | POA: Diagnosis not present

## 2020-05-22 DIAGNOSIS — N390 Urinary tract infection, site not specified: Secondary | ICD-10-CM | POA: Diagnosis not present

## 2020-05-22 DIAGNOSIS — I2510 Atherosclerotic heart disease of native coronary artery without angina pectoris: Secondary | ICD-10-CM | POA: Diagnosis not present

## 2020-05-22 DIAGNOSIS — R93421 Abnormal radiologic findings on diagnostic imaging of right kidney: Secondary | ICD-10-CM | POA: Diagnosis not present

## 2020-05-26 ENCOUNTER — Other Ambulatory Visit: Payer: Self-pay | Admitting: Internal Medicine

## 2020-05-26 ENCOUNTER — Telehealth: Payer: Self-pay

## 2020-05-26 DIAGNOSIS — N950 Postmenopausal bleeding: Secondary | ICD-10-CM

## 2020-05-26 DIAGNOSIS — H16223 Keratoconjunctivitis sicca, not specified as Sjogren's, bilateral: Secondary | ICD-10-CM | POA: Diagnosis not present

## 2020-05-26 NOTE — Telephone Encounter (Signed)
[facility] referring for Post menopausal bleeding . Paper records  Called and left voicemail for patient to call back to be scheduled.

## 2020-05-27 ENCOUNTER — Other Ambulatory Visit (HOSPITAL_COMMUNITY)
Admission: RE | Admit: 2020-05-27 | Discharge: 2020-05-27 | Disposition: A | Discharge status: Home / Self Care | Payer: Medicare Other | Source: Ambulatory Visit | Attending: Obstetrics and Gynecology | Admitting: Obstetrics and Gynecology

## 2020-05-27 ENCOUNTER — Other Ambulatory Visit: Payer: Self-pay

## 2020-05-27 ENCOUNTER — Encounter: Payer: Self-pay | Admitting: Obstetrics and Gynecology

## 2020-05-27 ENCOUNTER — Ambulatory Visit (INDEPENDENT_AMBULATORY_CARE_PROVIDER_SITE_OTHER): Payer: Medicare Other | Admitting: Obstetrics and Gynecology

## 2020-05-27 VITALS — BP 110/70 | Ht 63.0 in | Wt 165.0 lb

## 2020-05-27 DIAGNOSIS — N281 Cyst of kidney, acquired: Secondary | ICD-10-CM

## 2020-05-27 DIAGNOSIS — N950 Postmenopausal bleeding: Secondary | ICD-10-CM | POA: Insufficient documentation

## 2020-05-27 DIAGNOSIS — R9389 Abnormal findings on diagnostic imaging of other specified body structures: Secondary | ICD-10-CM | POA: Diagnosis not present

## 2020-05-27 NOTE — Progress Notes (Signed)
Patient ID: Kimberly Burke, female   DOB: April 09, 1940, 80 y.o.   MRN: [id number]  Reason for Consult: Gynecologic Exam   Referred by Perrin Maltese, MD  Subjective:     HPI:  Kimberly Burke is a 80 y.o. female.  She is here today for postmenopausal bleeding.  She reports that she experienced a fall at work about 4 weeks ago.  She started started having pain on her right side.  She started having uterine bleeding about 2 weeks ago.  She reports that the bleeding is not occurring every day.  She reports that the blood tends to come and go.  Sometimes it will be light and then it is enough to fill a pad.  She reports that she may have had a small amount of discharge before the bleeding started.  She reports that [facility]  She reports that this is her first episode of postmenopausal bleeding.  Records faxed from her primary care office were reviewed.  There is a CT report from May 22, 2020 which was included.  This abdominal and pelvic CT showed several findings.  There was a simple appearing cyst on the left lobe of the liver with simple with a maximum dimension of 4.3 cm.  This was not substantially changed from her prior exam.  There were multiple simple appearing right renal cyst primary in the mid and lower lobe the largest arising from the lower lobe measuring 3.8 cm.  2 other cyst in the right kidney were 2.5 to 3 cm in size.  There is no evidence of adrenal or renal mass or renal obstruction on the study obtained without IV contrast. This study also notes a prominent appearance of the right ovary which contains a focus of calcification.  The right ovary measures approximately 4 cm. The endometrial stripe appears abnormally thickened for a postmenopausal patient measuring 1.7 cm.  There is no fluid or free fluid noted in the pelvis.   Gynecological History  No LMP recorded. Patient is postmenopausal. Menarche: 13 Menopause: 6  History of fibroids,  polyps, or ovarian cysts? : no  History of PCOS? no Hstory of Endometriosis? no History of abnormal pap smears? no Have you had any sexually transmitted infections in the past? no  She identifies as a female. She is not sexually active.   She denies dyspareunia.    Past Medical History:  Diagnosis Date  . Arthritis   . Hypertension   . Thyroid disease    Family History  Problem Relation Age of Onset  . Cancer Brother        prostate or colon unsure which   . Breast cancer Sister 74   Past Surgical History:  Procedure Laterality Date  . CORONARY STENT INTERVENTION N/A 12/04/2018   Procedure: CORONARY STENT INTERVENTION;  Surgeon: Yolonda Kida, MD;  Location: [place] [facility];  Service: Cardiovascular;  Laterality: N/A;  . LEFT HEART CATH AND CORONARY ANGIOGRAPHY N/A 12/04/2018   Procedure: LEFT HEART CATH AND CORONARY ANGIOGRAPHY;  Surgeon: Dionisio David, MD;  Location: [place] [facility];  Service: Cardiovascular;  Laterality: N/A;    Short Social History:  Social History   Tobacco Use  . Smoking status: Former Research scientist (life sciences)  . Smokeless tobacco: Never Used  Substance Use Topics  . Alcohol use: Never    Allergies  Allergen Reactions  . Codeine Itching  . Penicillins Other (See Comments)    unknown  Current Outpatient Medications  Medication Sig Dispense Refill  . acetaminophen (TYLENOL) 650 MG CR tablet Take 1,300 mg by mouth daily.     Marland Kitchen aspirin 81 MG chewable tablet Chew 1 tablet (81 mg total) by mouth daily.    . DULoxetine (CYMBALTA) 30 MG capsule Take 30 mg by mouth daily.    . enalapril (VASOTEC) 2.5 MG tablet Take 1 tablet (2.5 mg total) by mouth daily. 30 tablet 1  . fluticasone (FLONASE) 50 MCG/ACT nasal spray Place 1 spray into both nostrils daily.    . hydrochlorothiazide (HYDRODIURIL) 25 MG tablet Take 1 tablet by mouth daily.    . hydroxychloroquine (PLAQUENIL) 200 MG tablet Take 2 tablets by mouth daily.   0  . levothyroxine (SYNTHROID,  LEVOTHROID) 50 MCG tablet Take 1 tablet by mouth daily.    . methotrexate (RHEUMATREX) 2.5 MG tablet Take 15 mg by mouth once a week.    . metoprolol tartrate (LOPRESSOR) 25 MG tablet Take 1 tablet (25 mg total) by mouth 2 (two) times daily. 60 tablet 1  . rosuvastatin (CRESTOR) 40 MG tablet Take 1 tablet (40 mg total) by mouth daily at 6 PM. 30 tablet 1  . ticagrelor (BRILINTA) 90 MG TABS tablet Take 1 tablet (90 mg total) by mouth 2 (two) times daily. 60 tablet 1  . cetirizine (ZYRTEC ALLERGY) 10 MG tablet Take 1 tablet (10 mg total) by mouth daily. 30 tablet 0  . fluticasone (FLONASE) 50 MCG/ACT nasal spray Place 1 spray into both nostrils daily. 15.8 mL 0   No current facility-administered medications for this visit.    Review of Systems  Constitutional: Negative for chills, fatigue, fever and unexpected weight change.  HENT: Negative for trouble swallowing.  Eyes: Negative for loss of vision.  Respiratory: Negative for cough, shortness of breath and wheezing.  Cardiovascular: Negative for chest pain, leg swelling, palpitations and syncope.  GI: Negative for abdominal pain, blood in stool, diarrhea, nausea and vomiting.  GU: Negative for difficulty urinating, dysuria, frequency and hematuria.  Musculoskeletal: Negative for back pain, leg pain and joint pain.  Skin: Negative for rash.  Neurological: Negative for dizziness, headaches, light-headedness, numbness and seizures.  Psychiatric: Negative for behavioral problem, confusion, depressed mood and sleep disturbance.        Objective:  Objective   Vitals:   05/27/20 1537  BP: 110/70  Weight: 165 lb (74.8 kg)  Height: 5\' 3"  (1.6 m)   Body mass index is 29.23 kg/m.  Physical Exam Vitals and nursing note reviewed. Exam conducted with a chaperone present.  Constitutional:      Appearance: Normal appearance. She is well-developed.  HENT:     Head: Normocephalic and atraumatic.  Eyes:     Extraocular Movements: Extraocular  movements intact.     Pupils: Pupils are equal, round, and reactive to light.  Cardiovascular:     Rate and Rhythm: Normal rate and regular rhythm.  Pulmonary:     Effort: Pulmonary effort is normal. No respiratory distress.     Breath sounds: Normal breath sounds.  Abdominal:     General: Abdomen is flat.     Palpations: Abdomen is soft.  Genitourinary:    Comments: External: Normal appearing vulva. No lesions noted.  Speculum examination: Normal appearing cervix. Small blood in the vaginal vault.  Bimanual examination: Uterus midline, non-tender, normal in size, shape and contour.  No CMT. No adnexal masses. No adnexal tenderness. Pelvis not fixed.  Breast exam: exam not performed Musculoskeletal:  General: No signs of injury.  Skin:    General: Skin is warm and dry.  Neurological:     Mental Status: She is alert and oriented to person, place, and time.  Psychiatric:        Behavior: Behavior normal.        Thought Content: Thought content normal.        Judgment: Judgment normal.     Endometrial Biopsy After discussion with the patient regarding her abnormal uterine bleeding I recommended that she proceed with an endometrial biopsy for further diagnosis. The risks, benefits, alternatives, and indications for an endometrial biopsy were discussed with the patient in detail. She understood the risks including infection, bleeding, cervical laceration and uterine perforation.  Verbal consent was obtained.   PROCEDURE NOTE:  [place] endometrial biopsy was performed using aseptic technique with iodine preparation.  The uterus was sounded to a length of 8 cm.  Adequate sampling was obtained with minimal blood loss.  The patient tolerated the procedure well.  Disposition will be pending pathology.   Assessment/Plan:     80 yo 1.  Postmenopausal bleeding-endometrial biopsy performed today.  Patient has a pelvic ultrasound which is scheduled for 06/29/2020.  Bleeding appears stable  today so I did not prescribe any progesterone medications however if bleeding does increase patient was asked to call the office and let us know. 2.  Right ovarian cyst with calcification noted.  Pelvic ultrasound is pending to further characterize the right ovarian cyst, the patient will likely need follow-up labs such as [place] 3.  Multiple renal cyst and right-sided back pain- I will refer patient to urology  Close follow-up with patient planned for next week to review pathology result.  More than 30 minutes were spent face to face with the patient in the room, reviewing the medical record, labs and images, and coordinating care for the patient. The plan of management was discussed in detail and counseling was provided.     Kimberly Prows MD [facility], [place] [facility] 05/27/2020 4:23 PM

## 2020-05-29 ENCOUNTER — Other Ambulatory Visit: Payer: Self-pay | Admitting: Obstetrics and Gynecology

## 2020-05-29 DIAGNOSIS — I10 Essential (primary) hypertension: Secondary | ICD-10-CM | POA: Diagnosis not present

## 2020-05-29 DIAGNOSIS — E782 Mixed hyperlipidemia: Secondary | ICD-10-CM | POA: Diagnosis not present

## 2020-05-29 DIAGNOSIS — I2510 Atherosclerotic heart disease of native coronary artery without angina pectoris: Secondary | ICD-10-CM | POA: Diagnosis not present

## 2020-05-29 DIAGNOSIS — C541 Malignant neoplasm of endometrium: Secondary | ICD-10-CM

## 2020-05-29 LAB — SURGICAL PATHOLOGY

## 2020-06-02 ENCOUNTER — Other Ambulatory Visit: Payer: Self-pay

## 2020-06-02 ENCOUNTER — Telehealth: Payer: Self-pay

## 2020-06-02 ENCOUNTER — Ambulatory Visit
Admission: RE | Admit: 2020-06-02 | Discharge: 2020-06-02 | Disposition: A | Discharge status: Home / Self Care | Payer: Self-pay | Source: Ambulatory Visit | Attending: Obstetrics and Gynecology | Admitting: Obstetrics and Gynecology

## 2020-06-02 DIAGNOSIS — C541 Malignant neoplasm of endometrium: Secondary | ICD-10-CM

## 2020-06-02 NOTE — Telephone Encounter (Signed)
Referral received from Dr. Gilman Schmidt for new diagnosis endometrial cancer. Records reviewed. Contacted Ms. Clementson and appointment was arranged for 06/03/2020 with Dr. Theora Gianotti. CT AP imaging obtained and taken to radiology for uploading.

## 2020-06-03 ENCOUNTER — Inpatient Hospital Stay: Payer: Medicare Other | Attending: Obstetrics and Gynecology | Admitting: Obstetrics and Gynecology

## 2020-06-03 ENCOUNTER — Inpatient Hospital Stay: Payer: Medicare Other

## 2020-06-03 VITALS — BP 144/73 | HR 70 | Temp 97.8°F | Resp 19 | Wt 170.4 lb

## 2020-06-03 DIAGNOSIS — C541 Malignant neoplasm of endometrium: Secondary | ICD-10-CM

## 2020-06-03 DIAGNOSIS — E039 Hypothyroidism, unspecified: Secondary | ICD-10-CM | POA: Diagnosis not present

## 2020-06-03 DIAGNOSIS — N189 Chronic kidney disease, unspecified: Secondary | ICD-10-CM | POA: Insufficient documentation

## 2020-06-03 DIAGNOSIS — I252 Old myocardial infarction: Secondary | ICD-10-CM | POA: Diagnosis not present

## 2020-06-03 DIAGNOSIS — Z79899 Other long term (current) drug therapy: Secondary | ICD-10-CM | POA: Insufficient documentation

## 2020-06-03 DIAGNOSIS — I10 Essential (primary) hypertension: Secondary | ICD-10-CM | POA: Insufficient documentation

## 2020-06-03 DIAGNOSIS — I878 Other specified disorders of veins: Secondary | ICD-10-CM | POA: Insufficient documentation

## 2020-06-03 DIAGNOSIS — M069 Rheumatoid arthritis, unspecified: Secondary | ICD-10-CM | POA: Insufficient documentation

## 2020-06-03 DIAGNOSIS — Z87891 Personal history of nicotine dependence: Secondary | ICD-10-CM | POA: Diagnosis not present

## 2020-06-03 LAB — CBC WITH DIFFERENTIAL/PLATELET
Abs Immature Granulocytes: 0.03 10*3/uL (ref 0.00–0.07)
Basophils Absolute: 0.1 10*3/uL (ref 0.0–0.1)
Basophils Relative: 1 %
Eosinophils Absolute: 0.1 10*3/uL (ref 0.0–0.5)
Eosinophils Relative: 2 %
HCT: 37.5 % (ref 36.0–46.0)
Hemoglobin: 12.4 g/dL (ref 12.0–15.0)
Immature Granulocytes: 1 %
Lymphocytes Relative: 22 %
Lymphs Abs: 1.4 10*3/uL (ref 0.7–4.0)
MCH: 31.2 pg (ref 26.0–34.0)
MCHC: 33.1 g/dL (ref 30.0–36.0)
MCV: 94.2 fL (ref 80.0–100.0)
Monocytes Absolute: 0.7 10*3/uL (ref 0.1–1.0)
Monocytes Relative: 11 %
Neutro Abs: 4.1 10*3/uL (ref 1.7–7.7)
Neutrophils Relative %: 63 %
Platelets: 193 10*3/uL (ref 150–400)
RBC: 3.98 MIL/uL (ref 3.87–5.11)
RDW: 14.5 % (ref 11.5–15.5)
WBC: 6.4 10*3/uL (ref 4.0–10.5)
nRBC: 0 % (ref 0.0–0.2)

## 2020-06-03 LAB — COMPREHENSIVE METABOLIC PANEL
ALT: 21 U/L (ref 0–44)
AST: 22 U/L (ref 15–41)
Albumin: 3.9 g/dL (ref 3.5–5.0)
Alkaline Phosphatase: 96 U/L (ref 38–126)
Anion gap: 8 (ref 5–15)
BUN: 13 mg/dL (ref 8–23)
CO2: 29 mmol/L (ref 22–32)
Calcium: 10.2 mg/dL (ref 8.9–10.3)
Chloride: 100 mmol/L (ref 98–111)
Creatinine, Ser: 0.86 mg/dL (ref 0.44–1.00)
GFR, Estimated: >60 mL/min (ref >60)
Glucose, Bld: 90 mg/dL (ref 70–99)
Potassium: 3.5 mmol/L (ref 3.5–5.1)
Sodium: 137 mmol/L (ref 135–145)
Total Bilirubin: 0.8 mg/dL (ref 0.3–1.2)
Total Protein: 7.7 g/dL (ref 6.5–8.1)

## 2020-06-03 LAB — TSH: TSH: 1.843 u[IU]/mL (ref 0.350–4.500)

## 2020-06-03 NOTE — Patient Instructions (Signed)
Uterine Cancer  Uterine cancer is an abnormal growth of cancer tissue in the womb (uterus). Uterine cancer can spread to other parts of the body. Uterine cancer usually occurs after a woman stops having menstrual periods (menopause). However, it may also occur around the time that menopause starts. The wall of the uterus has an inner layer of tissue called the endometrium and an outer layer of muscle tissue called the myometrium. The most common type of uterine cancer is endometrial cancer, and it starts in the endometrium. Cancer that starts in the myometrium (uterine sarcoma) is very rare. What are the causes? The exact cause of this condition is not known. What increases the risk? You are more likely to develop this condition if you:  Are older than 50.  Have a thicker than normal endometrium (endometrial hyperplasia).  Have never been pregnant or cannot have children.  Started menstruating when you were younger than 79 years old or are older than 52 and having menstrual periods.  Have a history of cancer, including: ? Cancer of the ovaries, or cancer of the intestines, colon, or rectum (colorectal cancer). ? A family history of uterine cancer or hereditary nonpolyposis colon cancer.  Have a long-term (chronic) condition, such as: ? Diabetes. ? High blood pressure. ? Thyroid disease. ? Gallbladder disease. ? Obesity. ? A personal history of enlarged ovaries with small cysts (polycystic ovarian syndrome).  Have been exposed to: ? Radiation. ? Cigarette smoke. ? Hormone therapy or a medicine called tamoxifen. What are the signs or symptoms? Symptoms of this condition include:  Abnormal vaginal bleeding or discharge. Bleeding may start as a watery, blood-streaked flow that gradually contains more blood. This is the most common symptom. If you experience abnormal vaginal bleeding, do not assume that it is part of menopause.  Vaginal bleeding after menopause.  Unexplained weight  loss.  Bleeding between periods.  Urination that is difficult, painful, or more frequent than usual.  A lump in the vagina.  Pain, bloating, fullness in the abdomen, or pelvic pain. You may also have pain during sex. How is this diagnosed? This condition may be diagnosed based on your medical history, symptoms, a physical exam, and a pelvic exam. During the pelvic exam, your health care provider will feel your pelvis for any growths, as well as for any enlarged lymph nodes. You may also have tests, including:  Blood and urine tests.  Imaging tests, such as X-rays, CT scans, ultrasound, or MRIs.  Hysteroscopy. This involves inserting a thin, flexible tube with a light and camera on the end through the vagina to look inside the uterus.  A Pap test to check for abnormal cells in the lower part of the uterus (cervix) and the upper vagina.  Removal of a tissue sample (biopsy) from the uterine lining. The sample will be examined in a lab to check for cancer cells.  Dilation and curettage (D&C). This procedure involves stretching the cervix and scraping the lining of the uterus to get a tissue sample to check for cancer cells. The cancer is staged to determine its severity and extent. Staging is an assessment of the size of the tumor and if, or where, the cancer has spread. The stages of uterine cancer are:  Stage I. The cancer is only in the uterus.  Stage II. The cancer has spread to the cervix.  Stage III. The cancer has spread outside the uterus, but not outside the pelvis. The cancer may have spread to the lymph nodes in   the pelvis. Lymph nodes are part of your body's disease-fighting (immune) system.  Stage IV. The cancer has spread to other parts of the body, such as the bladder, rectum, and other organs. How is this treated? This condition is often treated with surgery to remove:  The uterus, cervix, fallopian tubes, and ovaries (total hysterectomy).  The uterus and cervix  (simple hysterectomy). The type of hysterectomy you will have depends on the extent of your cancer. Lymph nodes near the uterus may also be removed. Treatment may include one or more of the following:  Chemotherapy. These are medicines to kill cancer cells or to slow their growth. Chemotherapy also kills other normal, healthy cells, including blood-forming cells in the bone marrow, hair follicles, and cells in the mouth, digestive tract, and reproductive system.  Radiation therapy. This uses high-energy rays to kill cancer cells and prevent their spread.  Chemoradiation. This treatment alternates chemotherapy with radiation treatments to enhance the effects of radiation.  Brachytherapy. This involves placing small radioactive capsules inside the body where the cancer was removed.  Hormone therapy. This includes taking medicines that lower the levels of estrogen in the body.  Immunotherapy. These medicines help your immune system fight cancer.  Targeted therapy. These medicines specifically kill cancer cells. Follow these instructions at home: Medicines  Take over-the-counter and prescription medicines only as told by your health care provider.  Ask your health care provider if the medicine prescribed to you requires you to avoid driving or using machinery. Activity  Return to your normal activities as told by your health care provider. Ask your health care provider what activities are safe for you.  Get regular exercise. Aim for 30 minutes of moderate-intensity activity 5 times a week. Examples of moderate-intensity activity include walking and yoga. Be sure to talk with your health care provider before starting any exercise routine. Lifestyle  Eat a healthy diet. A healthy diet includes a lot of fruits and vegetables, low-fat dairy products, lean meats, and fiber.  Do not use any products that contain nicotine or tobacco. These products include cigarettes, chewing tobacco, and vaping  devices, such as e-cigarettes. If you need help quitting, ask your health care provider.  Consider joining a support group to help you cope with stress. Your health care provider may be able to recommend a local or online support group. General instructions  Drink enough fluid to keep your urine pale yellow.  Do not have sex, douche, or place anything in your vagina, such as a tampon or diaphragm, until your health care provider says that it is safe.  Work with your health care provider to: ? Manage any chronic conditions you have. ? Manage any side effects of your treatment.  Keep all follow-up visits. This is important. Where to find more information  American Cancer Society: www.cancer.org  National Cancer Institute: www.cancer.gov Contact a health care provider if:  You have pain in your pelvis or abdomen that gets worse.  You cannot urinate.  You have abnormal vaginal bleeding.  You have a fever. Get help right away if:  You develop sudden or new severe symptoms, such as: ? Heavy bleeding. ? Severe weakness. ? Pain that is severe or does not get better with medicine. Summary  Uterine cancer is an abnormal growth of cancer tissue in the uterus. The most common type of uterine cancer starts in the endometrium and is called endometrial cancer.  This condition is often treated with surgery to remove the uterus, cervix, fallopian tubes,   and ovaries (total hysterectomy) or the uterus and cervix (simple hysterectomy).  Work with your health care provider to manage any long-term (chronic) conditions you have, such as diabetes, high blood pressure, thyroid disease, or gallbladder disease.  Consider joining a support group to help you cope with stress. Your health care provider may be able to recommend a local or online support group. This information is not intended to replace advice given to you by your health care provider. Make sure you discuss any questions you have with  your health care provider. Document Revised: 08/27/2019 Document Reviewed: 08/27/2019 Elsevier Patient Education  2021 [employer]  

## 2020-06-03 NOTE — Progress Notes (Signed)
Gynecologic Oncology Consult Visit   Referring Provider: Dr. 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 G3P3 female with h/o STEMI s/p stent placement who is seen in consultation from Dr. Gardiner Rhyme for high grade endometrial cancer.  She presented initially with postmenopausal bleeding and was seen by Dr. Gardiner Rhyme for evaluation. As part of her evaluation with her primary care office a CT was obtained of the abdomen and pelvis. CT showed several findings:  There was a simple appearing cyst on the left lobe of the liver with simple with a maximum dimension of 4.3 cm.  This was not substantially changed from her prior exam.  There were multiple simple appearing right renal cyst primary in the mid and lower lobe the largest arising from the lower lobe measuring 3.8 cm.  2 other cyst in the right kidney were 2.5 to 3 cm in size.  There is no evidence of adrenal or renal mass or renal obstruction on the study obtained without IV contrast. Prominent appearance of the right ovary which contains a focus of calcification.  The right ovary measures approximately 4 cm. The endometrial stripe appears abnormally thickened for a postmenopausal patient measuring 1.7 cm. There is no fluid or free fluid noted in the pelvis.     05/27/2020 EMBx A. 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  Uterus sounded to 8 cm  She presents today for evaluation. She states she is active and can walk several blocks with out getting short of breath. She sees Dr. Humphrey Rolls for her cardiology care. She had a stress test and could walk for 8 minutes. No chest pain or SOB with activity. Dr. Jefm Bryant manages her RA and she is on Plaquenil and MTX. She has not been on steroids for RA.  Problem List: Patient Active Problem List   Diagnosis Date Noted  .  Chronic kidney disease 06/03/2020  . Venous stasis 06/03/2020  . Endometrial cancer ([employer]) 06/03/2020  . NSTEMI (non-ST elevated myocardial infarction) ([place]) 12/03/2018  . Hypothyroid 12/03/2018  . Essential hypertension 12/03/2018  . Rheumatoid arthritis ([place]) 12/03/2018  . Degenerative arthritis 08/14/2013    Past Medical History: Past Medical History:  Diagnosis Date  . Arthritis   . Endometrial cancer ([place])   . Hypertension   . Hypothyroid   . NSTEMI (non-ST elevated myocardial infarction) ([place])   . Rheumatoid arthritis ([place])   . Thyroid disease     Past Surgical History: Past Surgical History:  Procedure Laterality Date  . CORONARY STENT INTERVENTION N/A 12/04/2018   Procedure: CORONARY STENT INTERVENTION;  Surgeon: Yolonda Kida, MD;  Location: [facility];  Service: Cardiovascular;  Laterality: N/A;  . LEFT HEART CATH AND CORONARY ANGIOGRAPHY N/A 12/04/2018   Procedure: LEFT HEART CATH AND CORONARY ANGIOGRAPHY;  Surgeon: Dionisio David, MD;  Location: [facility];  Service: Cardiovascular;  Laterality: N/A;    Past Gynecologic History:  Menarche: 13 Menopause; 60 Last Pap unknown; no abnormal Pap   OB History: SVD x 3 OB History  Gravida Para Term Preterm AB Living  3 3          SAB IAB Ectopic Multiple Live Births               # Outcome Date GA Lbr Len/2nd Weight Sex Delivery Anes PTL Lv  3  Para           2 Para           1 Para             Family History: Family History  Problem Relation Age of Onset  . Cancer Brother        prostate or colon unsure which     Social History: Social History   Socioeconomic History  . Marital status: Widowed    Spouse name: Not on file  . Number of children: Not on file  . Years of education: Not on file  . Highest education level: Not on file  Occupational History  . Not on file  Tobacco Use  . Smoking status: Former Research scientist (life sciences)  . Smokeless tobacco: Never Used  Vaping Use  . Vaping  Use: Never used  Substance and Sexual Activity  . Alcohol use: Never  . Drug use: Never  . Sexual activity: Not Currently  Other Topics Concern  . Not on file  Social History Narrative   Lives with daughter   Social Determinants of Health   Financial Resource Strain: Not on file  Food Insecurity: Not on file  Transportation Needs: Not on file  Physical Activity: Not on file  Stress: Not on file  Social Connections: Not on file  Intimate Partner Violence: Not on file    Allergies: Allergies  Allergen Reactions  . Codeine Itching  . Penicillins Other (See Comments)    unknown    Current Medications: Current Outpatient Medications  Medication Sig Dispense Refill  . acetaminophen (TYLENOL) 650 MG CR tablet Take 1,300 mg by mouth daily.     Marland Kitchen aspirin 81 MG chewable tablet Chew 1 tablet (81 mg total) by mouth daily.    . Cholecalciferol 1.25 MG (50000 UT) capsule Take by mouth.    . DULoxetine (CYMBALTA) 30 MG capsule Take 30 mg by mouth daily.    . enalapril (VASOTEC) 2.5 MG tablet Take 1 tablet (2.5 mg total) by mouth daily. 30 tablet 1  . fluticasone (FLONASE) 50 MCG/ACT nasal spray Place 1 spray into both nostrils daily.    . folic acid (FOLVITE) 1 MG tablet Take by mouth.    . hydrochlorothiazide (HYDRODIURIL) 25 MG tablet Take 1 tablet by mouth daily.    . hydroxychloroquine (PLAQUENIL) 200 MG tablet Take 2 tablets by mouth daily.   0  . levothyroxine (SYNTHROID, LEVOTHROID) 50 MCG tablet Take 1 tablet by mouth daily.    Marland Kitchen losartan (COZAAR) 25 MG tablet Take by mouth.    . methotrexate (RHEUMATREX) 2.5 MG tablet Take 15 mg by mouth once a week.    . metoprolol succinate (TOPROL-XL) 100 MG 24 hr tablet Take by mouth.    . metoprolol tartrate (LOPRESSOR) 25 MG tablet Take 1 tablet (25 mg total) by mouth 2 (two) times daily. 60 tablet 1  . mupirocin ointment (BACTROBAN) 2 % To affected area on chin till healed    . rosuvastatin (CRESTOR) 40 MG tablet Take 1 tablet (40 mg  total) by mouth daily at 6 PM. 30 tablet 1  . ticagrelor (BRILINTA) 90 MG TABS tablet Take 1 tablet (90 mg total) by mouth 2 (two) times daily. 60 tablet 1  . traMADol (ULTRAM) 50 MG tablet Take 50 mg by mouth 2 (two) times daily as needed.    . cetirizine (ZYRTEC ALLERGY) 10 MG tablet Take 1 tablet (10 mg total) by mouth daily. 30 tablet 0  . fluticasone (  FLONASE) 50 MCG/ACT nasal spray Place 1 spray into both nostrils daily. 15.8 mL 0   No current facility-administered medications for this visit.    Review of Systems General: negative for fevers, changes in weight or night sweats Skin: negative for changes in moles or sores or rash Eyes: negative for changes in vision HEENT: tinnitus o/w negative for change in hearing, voice changes Pulmonary: negative for dyspnea, orthopnea, productive cough, wheezing Cardiac: negative for palpitations, pain,. Hx of stemi & stent placement Gastrointestinal: negative for nausea, vomiting, constipation, diarrhea, hematemesis, hematochezia Genitourinary/Sexual: negative for dysuria, retention, hematuria, incontinence Ob/Gyn:  negative for abnormal bleeding, or pain Musculoskeletal: leg pain with walking and joint pain; negative for pain, joint pain, back pain Hematology: negative for easy bruising, abnormal bleeding Neurologic/Psych: negative for headaches, seizures, paralysis, weakness, numbness  Objective:  Physical Examination:  BP (!) 144/73   Pulse 70   Temp 97.8 F (36.6 C)   Resp 19   Wt 170 lb 6.4 oz (77.3 kg)   SpO2 97%   BMI 30.19 kg/m    ECOG Performance Status: 1 - Symptomatic but completely ambulatory  GENERAL: Patient is a well appearing female in no acute distress HEENT:  PERRL, neck supple with midline trachea. Thyroid without masses.  NODES:  No cervical, supraclavicular, axillary, or inguinal lymphadenopathy palpated.  LUNGS:  Clear to auscultation bilaterally.  No wheezes or rhonchi. HEART:  Regular rate and rhythm. No  murmur appreciated. ABDOMEN:  Soft, nontender, nondistended. No ascites or masses.  Positive, normoactive bowel sounds.  MSK:  No focal spinal tenderness to palpation. Full range of motion bilaterally in the upper extremities. Ambulatory EXTREMITIES:  No peripheral edema.   SKIN:  Clear with no obvious rashes or skin changes.  NEURO:  Nonfocal. Well oriented.  Appropriate affect.  Pelvic: Chaperoned by Nursing EGBUS: no lesions Cervix: no lesions, nontender, mobile Vagina: no lesions, no discharge or bleeding Uterus: normal size, nontender, mobile Adnexa: no palpable masses but fullness on the right and tenderness to palpation Rectovaginal: confirmatory   Lab Review Outside labs     Radiologic Imaging: As per HPI    Assessment:  AYRA FALCONER is a 80 y.o. female diagnosed with high grade endometrial cancer.   Right ovarian cyst with calcification  Hepatic and renal cysts   Medical co-morbidities complicating care: HTN, h/o STEMI 2020 s/p stent placement (Dr. Humphrey Rolls is her cardiologist), RA (Dr. Precious Reel), hypothyroidism, Body mass index is 30.19 kg/m.  Plan:   Problem List Items Addressed This Visit      Genitourinary   Endometrial cancer ([place]) - Primary   Relevant Medications   folic acid (FOLVITE) 1 MG tablet   Other Relevant Orders   DG Chest 2 View   EKG 12-Lead   CBC with Differential/Platelet   Comprehensive metabolic panel   TSH      We discussed options for management including surgery vs radiation. While she does have some medical issues I do think she is a surgical candidate. We will arrange for cardiology clearance with Dr. Humphrey Rolls. ECG ordered today. We will arrange for preop visit at [facility] with [facility]. CXR ordered to assess for metastatic disease given high grade cancer. CBC, CMP, and TSH (h/o hypothyroidism) ordered.   I tried to call Dr. Laurelyn Sickle office [phone] to arrange for an appointment, however, was unsuccessful. I did speak to his nurse but  she referred me to the front desk. Our team will try again and if unable to reach them can  arrange for cardiology at [facility]. She will need to discontinue the ticagrelor (BRILINTA) 90 MG TABS tablet 5 days prior to surgery.   We reached out to Dr. Jefm Bryant to discuss her medications for RA. She is on plaquenil (daily dosing) and methotrexate (once weekly dosing).    Surgical planned for Jun 17, 2020 with Dr. Fransisca Connors. Laparoscopic TH, BSO, sentinel LND injection/mapping/biopsy, possible PALND, omentectomy, and biopsies.   Suggested return to clinic 2-3 weeks after surgery at [facility] for her postop check.   The patient's diagnosis, an outline of the further diagnostic and laboratory studies which will be required, the recommendation, and alternatives were discussed.  All questions were answered to the patient's satisfaction.  A total of at least 80 minutes were spent with the patient/family today; >50% was spent in education, counseling and coordination of care for endometrial cancer.    Kimberly Burke Gaetana Michaelis, MD   ADDENDUM: I was able to reach Dr. Scharlene Gloss office. I left a message with his nurse and my cell phone. They will contact me with recommendations.  Kimberly Burke Gaetana Michaelis, MD     CC:  Dr. Gardiner Rhyme

## 2020-06-04 ENCOUNTER — Ambulatory Visit: Payer: Medicare Other | Admitting: Obstetrics and Gynecology

## 2020-06-04 ENCOUNTER — Ambulatory Visit
Admission: RE | Admit: 2020-06-04 | Discharge: 2020-06-04 | Disposition: A | Discharge status: Home / Self Care | Payer: Medicare Other | Source: Home / Self Care | Attending: Obstetrics and Gynecology | Admitting: Obstetrics and Gynecology

## 2020-06-04 ENCOUNTER — Other Ambulatory Visit: Payer: Self-pay

## 2020-06-04 ENCOUNTER — Ambulatory Visit
Admission: RE | Admit: 2020-06-04 | Discharge: 2020-06-04 | Disposition: A | Discharge status: Home / Self Care | Payer: Medicare Other | Source: Ambulatory Visit | Attending: Obstetrics and Gynecology | Admitting: Obstetrics and Gynecology

## 2020-06-04 ENCOUNTER — Telehealth: Payer: Self-pay

## 2020-06-04 DIAGNOSIS — I440 Atrioventricular block, first degree: Secondary | ICD-10-CM | POA: Insufficient documentation

## 2020-06-04 DIAGNOSIS — I700 Atherosclerosis of aorta: Secondary | ICD-10-CM | POA: Insufficient documentation

## 2020-06-04 DIAGNOSIS — Z01810 Encounter for preprocedural cardiovascular examination: Secondary | ICD-10-CM | POA: Diagnosis not present

## 2020-06-04 DIAGNOSIS — Z01818 Encounter for other preprocedural examination: Secondary | ICD-10-CM | POA: Insufficient documentation

## 2020-06-04 DIAGNOSIS — C541 Malignant neoplasm of endometrium: Secondary | ICD-10-CM | POA: Insufficient documentation

## 2020-06-04 NOTE — Telephone Encounter (Signed)
Appointment made with Dr. Laurelyn Sickle for surgical cardiac clearance, Jun 08, 2020 at 1345. Attempted to call appointment details to Kimberly Burke. No answer. Left voicemail and will try again later.

## 2020-06-05 ENCOUNTER — Telehealth: Payer: Self-pay

## 2020-06-05 NOTE — Telephone Encounter (Addendum)
Called and notified Ms.Bang with her appointment details for Dr Laurelyn Sickle. Read back performed. We will obtain the note once visit is complete. EKG/CXR completed.

## 2020-06-08 ENCOUNTER — Telehealth: Payer: Self-pay

## 2020-06-08 DIAGNOSIS — E782 Mixed hyperlipidemia: Secondary | ICD-10-CM | POA: Diagnosis not present

## 2020-06-08 DIAGNOSIS — I10 Essential (primary) hypertension: Secondary | ICD-10-CM | POA: Diagnosis not present

## 2020-06-08 DIAGNOSIS — I2510 Atherosclerotic heart disease of native coronary artery without angina pectoris: Secondary | ICD-10-CM | POA: Diagnosis not present

## 2020-06-08 DIAGNOSIS — R0602 Shortness of breath: Secondary | ICD-10-CM | POA: Diagnosis not present

## 2020-06-08 NOTE — Telephone Encounter (Signed)
Received call from Ms. Everett regarding all her upcoming appointments. Reviewed cardiology appointment with Dr. Laurelyn Sickle for cardiac clearance today at 1345. She also has a new consult scheduled for a renal cyst with Dr. Erlene Quan 5/17. I have reviewed this appointment and provided directions to [facility].

## 2020-06-09 ENCOUNTER — Telehealth: Payer: Self-pay

## 2020-06-09 ENCOUNTER — Encounter: Payer: Self-pay | Admitting: Urology

## 2020-06-09 ENCOUNTER — Ambulatory Visit: Payer: Medicare Other | Admitting: Urology

## 2020-06-09 ENCOUNTER — Other Ambulatory Visit: Payer: Self-pay

## 2020-06-09 VITALS — BP 151/90 | HR 69 | Ht 63.0 in | Wt 170.0 lb

## 2020-06-09 DIAGNOSIS — N281 Cyst of kidney, acquired: Secondary | ICD-10-CM

## 2020-06-09 NOTE — Progress Notes (Signed)
06/09/2020 10:54 AM   Kimberly Burke [birth year] [id number]  Referring provider: Homero Fellers, [place],  [place] [address]  Chief Complaint  Patient presents with  . renal cyst    HPI: Kimberly Burke is a 80 year old female referred for further evaluation of incidental renal cyst on CT scan.  She underwent a noncontrast CT scan at [facility] on 05/22/2020.  This indicated multiple renal cysts, primarily on the right measuring up to 3.0 cm.  Notably, renal cyst were seen on abdominal ultrasound dating back to 2011 on the right measuring 1.9 cm which were simple at the time.  She denies any overt flank pain.  She does have some soreness in her back especially when getting up out of a chair on occasion, right greater than left.  No hematuria.  No family history of renal cell carcinoma.  Notably, she was just diagnosed with high-grade endometrial cancer and is under the care of Dr. Nechama Guard and Dr. Theora Gianotti.  She is scheduled to have surgery on Jun 17, 2020 with Dr. Fransisca Connors.   PMH: Past Medical History:  Diagnosis Date  . Arthritis   . Endometrial cancer ([place])   . Hypertension   . Hypothyroid   . NSTEMI (non-ST elevated myocardial infarction) ([place])   . Rheumatoid arthritis ([place])   . Thyroid disease     Surgical History: Past Surgical History:  Procedure Laterality Date  . CORONARY STENT INTERVENTION N/A 12/04/2018   Procedure: CORONARY STENT INTERVENTION;  Surgeon: Yolonda Kida, MD;  Location: [facility];  Service: Cardiovascular;  Laterality: N/A;  . LEFT HEART CATH AND CORONARY ANGIOGRAPHY N/A 12/04/2018   Procedure: LEFT HEART CATH AND CORONARY ANGIOGRAPHY;  Surgeon: Dionisio David, MD;  Location: [facility];  Service: Cardiovascular;  Laterality: N/A;    Home Medications:  Allergies as of 06/09/2020      Reactions   Codeine Itching   Penicillins Other (See Comments)   unknown      Medication List        Accurate as of Jun 09, 2020 10:54 AM. If you have any questions, ask your nurse or doctor.        acetaminophen 650 MG CR tablet Commonly known as: TYLENOL Take 1,300 mg by mouth daily.   aspirin 81 MG chewable tablet Chew 1 tablet (81 mg total) by mouth daily.   cetirizine 10 MG tablet Commonly known as: ZyrTEC Allergy Take 1 tablet (10 mg total) by mouth daily.   Cholecalciferol 1.25 MG (50000 UT) capsule Take by mouth.   DULoxetine 30 MG capsule Commonly known as: CYMBALTA Take 30 mg by mouth daily.   enalapril 2.5 MG tablet Commonly known as: VASOTEC Take 1 tablet (2.5 mg total) by mouth daily.   fluticasone 50 MCG/ACT nasal spray Commonly known as: FLONASE Place 1 spray into both nostrils daily. What changed: Another medication with the same name was removed. Continue taking this medication, and follow the directions you see here. Changed by: Hollice Espy, MD   folic acid 1 MG tablet Commonly known as: FOLVITE Take by mouth.   hydrochlorothiazide 25 MG tablet Commonly known as: HYDRODIURIL Take 1 tablet by mouth daily.   hydroxychloroquine 200 MG tablet Commonly known as: PLAQUENIL Take 2 tablets by mouth daily.   levothyroxine 50 MCG tablet Commonly known as: SYNTHROID Take 1 tablet by mouth daily.   losartan 25 MG tablet Commonly known as: COZAAR Take by mouth.   methotrexate 2.5  MG tablet Commonly known as: RHEUMATREX Take 15 mg by mouth once a week.   metoprolol succinate 100 MG 24 hr tablet Commonly known as: TOPROL-XL Take by mouth.   metoprolol tartrate 25 MG tablet Commonly known as: LOPRESSOR Take 1 tablet (25 mg total) by mouth 2 (two) times daily.   mupirocin ointment 2 % Commonly known as: BACTROBAN To affected area on chin till healed   rosuvastatin 40 MG tablet Commonly known as: CRESTOR Take 1 tablet (40 mg total) by mouth daily at 6 PM.   ticagrelor 90 MG Tabs tablet Commonly known as: BRILINTA Take 1 tablet (90 mg  total) by mouth 2 (two) times daily.   traMADol 50 MG tablet Commonly known as: ULTRAM Take 50 mg by mouth 2 (two) times daily as needed.       Allergies:  Allergies  Allergen Reactions  . Codeine Itching  . Penicillins Other (See Comments)    unknown    Family History: Family History  Problem Relation Age of Onset  . Cancer Brother        prostate or colon unsure which     Social History:  reports that she has quit smoking. She has never used smokeless tobacco. She reports that she does not drink alcohol and does not use drugs.   Physical Exam: BP (!) 151/90   Pulse 69   Ht 5\' 3"  (1.6 m)   Wt 170 lb (77.1 kg)   BMI 30.11 kg/m   Constitutional:  Alert and oriented, No acute distress. HEENT: [place] AT, moist mucus membranes.  Trachea midline, no masses. Cardiovascular: No clubbing, cyanosis, or edema. Respiratory: Normal respiratory effort, no increased work of breathing. Skin: No rashes, bruises or suspicious lesions. Neurologic: Grossly intact, no focal deficits, moving all 4 extremities. Psychiatric: Normal mood and affect.  Pertinent Imaging: CT abdomen pelvis noncontrast from 04/2020 personally reviewed, multifocal right renal cyst.  Not fully characterized in the absence of IV contrast.  Assessment & Plan:    1. Renal cyst Probable asymptomatic simple right renal cyst based on previous imaging  Given that there has been increase in size, 1 cm over the past 10 years, does seem reasonable to consider renal ultrasound to ensure that these are simple.  If they are in fact simple, will require no further follow-up.  We did discuss the Bosniak classification system as it relates to risk of for malignancy.  We will call her with her renal ultrasound results. - Urinalysis, Complete   Hollice Espy, MD  [facility] [address], [place], [place] [address] [phone]

## 2020-06-09 NOTE — Telephone Encounter (Signed)
Called to obtain note from Dr. Laurelyn Sickle for cardiac clearance. Message sent to [facility] regarding appointments still needed for upcoming surgery on 06/17/20.

## 2020-06-10 ENCOUNTER — Telehealth: Payer: Self-pay

## 2020-06-10 NOTE — Telephone Encounter (Signed)
Surgery date is being moved to 5/27 at [facility]. They will be calling her to arrange an office visit for 5/23. She has been instructed to hold her Brilinta starting 5/23. She will take her 5/22 dose then hold it. Read back performed. She was encouraged to call with any questions. Copy of cardiology note/EKG/CXR sent to [facility].

## 2020-06-11 LAB — URINALYSIS, COMPLETE
Bilirubin, UA: NEGATIVE
Glucose, UA: NEGATIVE
Ketones, UA: NEGATIVE
Leukocytes,UA: NEGATIVE
Nitrite, UA: NEGATIVE
Protein,UA: NEGATIVE
RBC, UA: NEGATIVE
Specific Gravity, UA: 1.015 (ref 1.005–1.030)
Urobilinogen, Ur: 0.2 mg/dL (ref 0.2–1.0)
pH, UA: 6.5 (ref 5.0–7.5)

## 2020-06-11 LAB — MICROSCOPIC EXAMINATION: Bacteria, UA: NONE SEEN

## 2020-06-12 ENCOUNTER — Telehealth: Payer: Self-pay

## 2020-06-12 NOTE — Telephone Encounter (Signed)
Spoke with [employer]. She has received her appointment for Monday at [facility]. Surgery being scheduled for 06/19/20.

## 2020-06-15 DIAGNOSIS — I2510 Atherosclerotic heart disease of native coronary artery without angina pectoris: Secondary | ICD-10-CM | POA: Insufficient documentation

## 2020-06-15 DIAGNOSIS — Z0000 Encounter for general adult medical examination without abnormal findings: Secondary | ICD-10-CM | POA: Insufficient documentation

## 2020-06-15 DIAGNOSIS — Z01818 Encounter for other preprocedural examination: Secondary | ICD-10-CM | POA: Diagnosis not present

## 2020-06-17 DIAGNOSIS — Z01818 Encounter for other preprocedural examination: Secondary | ICD-10-CM | POA: Diagnosis not present

## 2020-06-17 DIAGNOSIS — I2510 Atherosclerotic heart disease of native coronary artery without angina pectoris: Secondary | ICD-10-CM | POA: Diagnosis not present

## 2020-06-17 DIAGNOSIS — I10 Essential (primary) hypertension: Secondary | ICD-10-CM | POA: Diagnosis not present

## 2020-06-17 DIAGNOSIS — M069 Rheumatoid arthritis, unspecified: Secondary | ICD-10-CM | POA: Diagnosis not present

## 2020-06-17 DIAGNOSIS — E039 Hypothyroidism, unspecified: Secondary | ICD-10-CM | POA: Diagnosis not present

## 2020-06-17 DIAGNOSIS — Z20822 Contact with and (suspected) exposure to covid-19: Secondary | ICD-10-CM | POA: Diagnosis not present

## 2020-06-19 DIAGNOSIS — K7689 Other specified diseases of liver: Secondary | ICD-10-CM | POA: Diagnosis not present

## 2020-06-19 DIAGNOSIS — E039 Hypothyroidism, unspecified: Secondary | ICD-10-CM | POA: Diagnosis not present

## 2020-06-19 DIAGNOSIS — E785 Hyperlipidemia, unspecified: Secondary | ICD-10-CM | POA: Diagnosis not present

## 2020-06-19 DIAGNOSIS — D1809 Hemangioma of other sites: Secondary | ICD-10-CM | POA: Diagnosis not present

## 2020-06-19 DIAGNOSIS — N189 Chronic kidney disease, unspecified: Secondary | ICD-10-CM | POA: Diagnosis not present

## 2020-06-19 DIAGNOSIS — Z87891 Personal history of nicotine dependence: Secondary | ICD-10-CM | POA: Diagnosis not present

## 2020-06-19 DIAGNOSIS — Z170 Estrogen receptor positive status [ER+]: Secondary | ICD-10-CM | POA: Diagnosis not present

## 2020-06-19 DIAGNOSIS — I878 Other specified disorders of veins: Secondary | ICD-10-CM | POA: Diagnosis not present

## 2020-06-19 DIAGNOSIS — Z7982 Long term (current) use of aspirin: Secondary | ICD-10-CM | POA: Diagnosis not present

## 2020-06-19 DIAGNOSIS — I252 Old myocardial infarction: Secondary | ICD-10-CM | POA: Diagnosis not present

## 2020-06-19 DIAGNOSIS — I2510 Atherosclerotic heart disease of native coronary artery without angina pectoris: Secondary | ICD-10-CM | POA: Diagnosis not present

## 2020-06-19 DIAGNOSIS — Z885 Allergy status to narcotic agent status: Secondary | ICD-10-CM | POA: Diagnosis not present

## 2020-06-19 DIAGNOSIS — M1990 Unspecified osteoarthritis, unspecified site: Secondary | ICD-10-CM | POA: Diagnosis not present

## 2020-06-19 DIAGNOSIS — M069 Rheumatoid arthritis, unspecified: Secondary | ICD-10-CM | POA: Diagnosis not present

## 2020-06-19 DIAGNOSIS — Z79899 Other long term (current) drug therapy: Secondary | ICD-10-CM | POA: Diagnosis not present

## 2020-06-19 DIAGNOSIS — I129 Hypertensive chronic kidney disease with stage 1 through stage 4 chronic kidney disease, or unspecified chronic kidney disease: Secondary | ICD-10-CM | POA: Diagnosis not present

## 2020-06-19 DIAGNOSIS — N281 Cyst of kidney, acquired: Secondary | ICD-10-CM | POA: Diagnosis not present

## 2020-06-19 HISTORY — PX: ABDOMINAL HYSTERECTOMY: SHX81

## 2020-06-24 ENCOUNTER — Telehealth: Payer: Self-pay

## 2020-06-24 ENCOUNTER — Other Ambulatory Visit: Payer: Self-pay

## 2020-06-24 NOTE — Telephone Encounter (Signed)
Clarified for Ms Skyles that she should resume her preop home dose of Brilinta (ticagrelor) 90 mg twice a day.

## 2020-06-29 ENCOUNTER — Ambulatory Visit
Admission: RE | Admit: 2020-06-29 | Discharge: 2020-06-29 | Disposition: A | Discharge status: Home / Self Care | Payer: Medicare Other | Source: Ambulatory Visit | Attending: Internal Medicine | Admitting: Internal Medicine

## 2020-06-29 ENCOUNTER — Other Ambulatory Visit: Payer: Self-pay

## 2020-06-29 DIAGNOSIS — N950 Postmenopausal bleeding: Secondary | ICD-10-CM

## 2020-07-01 ENCOUNTER — Encounter: Payer: Self-pay | Admitting: Obstetrics and Gynecology

## 2020-07-01 ENCOUNTER — Other Ambulatory Visit: Payer: Medicare Other

## 2020-07-01 ENCOUNTER — Inpatient Hospital Stay: Payer: Medicare Other | Attending: Obstetrics and Gynecology | Admitting: Obstetrics and Gynecology

## 2020-07-01 VITALS — BP 120/82 | HR 67 | Temp 97.8°F | Wt 170.0 lb

## 2020-07-01 DIAGNOSIS — Z90722 Acquired absence of ovaries, bilateral: Secondary | ICD-10-CM | POA: Insufficient documentation

## 2020-07-01 DIAGNOSIS — Z90710 Acquired absence of both cervix and uterus: Secondary | ICD-10-CM | POA: Insufficient documentation

## 2020-07-01 DIAGNOSIS — C541 Malignant neoplasm of endometrium: Secondary | ICD-10-CM

## 2020-07-01 DIAGNOSIS — Z7189 Other specified counseling: Secondary | ICD-10-CM

## 2020-07-01 DIAGNOSIS — N281 Cyst of kidney, acquired: Secondary | ICD-10-CM | POA: Insufficient documentation

## 2020-07-01 DIAGNOSIS — K7689 Other specified diseases of liver: Secondary | ICD-10-CM | POA: Insufficient documentation

## 2020-07-01 NOTE — Progress Notes (Signed)
Gynecologic Oncology Consult Visit   Referring Provider: Dr. 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 G3P3 female with h/o STEMI s/p stent placement who is seen in consultation from Dr. Gardiner Rhyme for high grade endometrial cancer s/p EUA, TLH-BSO with SLN mapping and dissection with Dr. Erasmo Leventhal at [facility] on 06/19/20. She returns to clinic for post op evaluation and discussion of pathology results.   A. Right pelvic sentinel lymph node, excision: Two lymph nodes, negative for malignancy (0/2).   B. Left pelvic sentinel lymph node, excision: Benign fibroadipose tissue, no lymph nodes identified. Negative for malignancy.   C. 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 Lymphovascular invasion:  Absent.  Endometrium: Atrophic  Myometrium: Adenomyosis with multiple leiomyomata (largest 5 cm).  Serosa: No pathologic diagnosis. Cervix:  negative for malignancy.  Right ovary with capillary hemangioma (0.9 cm). Left ovary with no pathologic diagnosis. Right fallopian tube with paratubal cyst. Left fallopian tube with no pathologic diagnosis.  See synoptic report.  TUMOR   Tumor Site:  Endometrial polyp   Tumor Size:  Greatest Dimension (Centimeters): 1.5 cm   Histologic Type:  Serous carcinoma   Myometrial Invasion:  Not identified   Adenomyosis:  Present, uninvolved by carcinoma   Uterine Serosa Involvement:  Not identified   Lower Uterine Segment Involvement:  Not identified   Cervical Stromal Involvement:  Not identified   Other Tissue / Organ Involvement:  Not applicable   Lymphovascular Invasion (LVI):  Not identified   REGIONAL LYMPH NODES   Regional Lymph Node Status:      :  All regional lymph nodes negative for tumor cells      Lymph Nodes Examined:        Total Number of Pelvic Nodes Examined:  2      Number of Pelvic Sentinel Nodes Examined:  2     Total Number of Para-aortic Nodes Examined:  0      Number of Para-aortic Sentinel Nodes Examined:  0   PATHOLOGIC STAGE CLASSIFICATION (pTNM, AJCC 8th Edition)   Reporting of pT, pN, and (when applicable) pM categories is based on information available to the pathologist at the time the report is issued. As per the AJCC (Chapter 1, 8th Ed.) it is the managing physician's responsibility to establish the final pathologic stage based upon all pertinent information, including but potentiallynot limited to this pathology report.   pT Category:  pT1a   Regional Lymph Nodes Modifier:  (sn)   pN Category:  pN0   FIGO STAGE   FIGO Stage:  IA   MMR & MSI pending   % of Cells Staining Intensity Score Interpretation  ER IHC 31 1+ POSITIVE  PR IHC <1 2+ NEGATIVE  HER2/neu IHC 40 1+ NEGATIVE     Cytology Scant cytologically bland cells present, suggestive of endometrial cells.   Comment:  There is no evidence of malignancy.  A single group of bland cells resembling endometrial epithelium is present.  The differential diagnosis includes endometriosis versus refluxed benign endometrium.  Clinical correlation with the corresponding surgical pathology specimen ([id number]) is recommended.        Gynecologic Oncology History:  Kimberly Burke is a pleasant G53P3 female with h/o STEMI s/p stent placement who is seen in consultation from Dr. Gardiner Rhyme for high grade endometrial cancer s/p EUA, TLH-BSO with SLN mapping and dissection for  stage Ia disease.  She presented initially with postmenopausal bleeding and was seen by Dr. Gardiner Rhyme for evaluation. As part of her evaluation with her primary care office a CT was obtained of the abdomen and pelvis. CT showed several findings:  There was a simple appearing cyst on the  left lobe of the liver with simple with a maximum dimension of 4.3 cm.  This was not substantially changed from her prior exam.  There were multiple simple appearing right renal cyst primary in the mid and lower lobe the largest arising from the lower lobe measuring 3.8 cm.  2 other cyst in the right kidney were 2.5 to 3 cm in size.  There is no evidence of adrenal or renal mass or renal obstruction on the study obtained without IV contrast. Prominent appearance of the right ovary which contains a focus of calcification.  The right ovary measures approximately 4 cm. The endometrial stripe appears abnormally thickened for a postmenopausal patient measuring 1.7 cm. There is no fluid or free fluid noted in the pelvis.     05/27/2020 EMBx A. 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  Uterus sounded to 8 cm    Problem List: Patient Active Problem List   Diagnosis Date Noted  . Chronic kidney disease 06/03/2020  . Venous stasis 06/03/2020  . Endometrial cancer ([place]) 06/03/2020  . NSTEMI (non-ST elevated myocardial infarction) ([place]) 12/03/2018  . Hypothyroid 12/03/2018  . Essential hypertension 12/03/2018  . Rheumatoid arthritis ([place]) 12/03/2018  . Degenerative arthritis 08/14/2013    Past Medical History: Past Medical History:  Diagnosis Date  . Arthritis   . Endometrial cancer ([place])   . Hypertension   . Hypothyroid   . NSTEMI (non-ST elevated myocardial infarction) ([place])   . Rheumatoid arthritis ([place])   . Thyroid disease     Past Surgical History: Past Surgical History:  Procedure Laterality Date  . ABDOMINAL HYSTERECTOMY  06/19/2020   EUA, TLH-BSO with SLN mapping and dissection with Dr. Erasmo Leventhal at [facility]   . CORONARY STENT INTERVENTION N/A 12/04/2018   Procedure: CORONARY STENT INTERVENTION;  Surgeon: Yolonda Kida, MD;  Location: [facility];  Service: Cardiovascular;  Laterality: N/A;  . LEFT HEART CATH AND CORONARY ANGIOGRAPHY N/A 12/04/2018   Procedure: LEFT HEART CATH AND CORONARY ANGIOGRAPHY;  Surgeon: Dionisio David, MD;  Location: [facility];  Service: Cardiovascular;  Laterality: N/A;    Past Gynecologic History:  Menarche: 13 Menopause; 41 Last Pap unknown; no abnormal Pap   OB History: SVD x 3 OB History  Gravida Para Term Preterm AB Living  3 3          SAB IAB Ectopic Multiple Live Births               # Outcome Date GA Lbr Len/2nd Weight Sex Delivery Anes PTL Lv  3 Para           2 Para           1 Para             Family History: Family History  Problem Relation Age of Onset  . Cancer Brother        prostate or colon unsure which     Social History: Social History   Socioeconomic History  . Marital status: Widowed    Spouse name: Not on file  .  Number of children: Not on file  . Years of education: Not on file  . Highest education level: Not on file  Occupational History  . Not on file  Tobacco Use  . Smoking status: Former Research scientist (life sciences)  . Smokeless tobacco: Never Used  Vaping Use  . Vaping Use: Never used  Substance and Sexual Activity  . Alcohol use: Never  . Drug use: Never  . Sexual activity: Not Currently  Other Topics Concern  . Not on file  Social History Narrative   Lives with daughter   Social Determinants of Health   Financial Resource Strain: Not on file  Food Insecurity: Not on file  Transportation Needs: Not on file  Physical Activity: Not on file  Stress: Not on file  Social Connections: Not on file  Intimate Partner Violence: Not on file    Allergies: Allergies  Allergen Reactions  . Codeine Itching  . Penicillins Other (See Comments)    unknown    Current Medications: Current Outpatient Medications  Medication Sig Dispense Refill  . acetaminophen (TYLENOL) 650 MG CR tablet Take 1,300 mg by mouth daily.     Marland Kitchen aspirin 81 MG chewable tablet  Chew 1 tablet (81 mg total) by mouth daily.    . Cholecalciferol 1.25 MG (50000 UT) capsule Take by mouth.    . DULoxetine (CYMBALTA) 30 MG capsule Take 30 mg by mouth daily.    . enalapril (VASOTEC) 2.5 MG tablet Take 1 tablet (2.5 mg total) by mouth daily. 30 tablet 1  . fluticasone (FLONASE) 50 MCG/ACT nasal spray Place 1 spray into both nostrils daily.    . folic acid (FOLVITE) 1 MG tablet Take by mouth.    . hydrochlorothiazide (HYDRODIURIL) 25 MG tablet Take 1 tablet by mouth daily.    . hydroxychloroquine (PLAQUENIL) 200 MG tablet Take 2 tablets by mouth daily.   0  . levothyroxine (SYNTHROID, LEVOTHROID) 50 MCG tablet Take 1 tablet by mouth daily.    Marland Kitchen losartan (COZAAR) 25 MG tablet Take by mouth.    . methotrexate (RHEUMATREX) 2.5 MG tablet Take 15 mg by mouth once a week.    . metoprolol succinate (TOPROL-XL) 100 MG 24 hr tablet Take by mouth.    . metoprolol tartrate (LOPRESSOR) 25 MG tablet Take 1 tablet (25 mg total) by mouth 2 (two) times daily. 60 tablet 1  . mupirocin ointment (BACTROBAN) 2 % To affected area on chin till healed    . rosuvastatin (CRESTOR) 40 MG tablet Take 1 tablet (40 mg total) by mouth daily at 6 PM. 30 tablet 1  . ticagrelor (BRILINTA) 90 MG TABS tablet Take 1 tablet (90 mg total) by mouth 2 (two) times daily. 60 tablet 1  . traMADol (ULTRAM) 50 MG tablet Take 50 mg by mouth 2 (two) times daily as needed.    . cetirizine (ZYRTEC ALLERGY) 10 MG tablet Take 1 tablet (10 mg total) by mouth daily. 30 tablet 0   No current facility-administered medications for this visit.    Review of Systems General:  no complaints Skin: no complaints Eyes: no complaints HEENT: no complaints Breasts: no complaints Pulmonary: no complaints Cardiac: no complaints Gastrointestinal: no complaints Genitourinary/Sexual: no complaints Ob/Gyn: no complaints Musculoskeletal: no complaints Hematology: no complaints Neurologic/Psych: no complaints   Objective:  Physical  Examination:  BP 120/82   Pulse 67   Temp 97.8 F (36.6 C)   Wt 170 lb (77.1 kg)   SpO2 98%   BMI 30.11 kg/m  ECOG Performance Status: 1 - Symptomatic but completely ambulatory  GENERAL: Patient is a well appearing female in no acute distress LUNGS:  No audible wheezing or respiratory distress  ABDOMEN:  No hernias, incisions healing well.  EXTREMITIES:  No peripheral edema.  SKIN:  Clear with no obvious rashes or skin changes.  NEURO:  Nonfocal. Well oriented.  Appropriate affect.   Lab Review N/A  Radiologic Imaging: As per HPI    Assessment:  Kimberly Burke is a 80 y.o. female diagnosed with high grade endometrial cancer.   Right ovarian cyst with calcification  Hepatic and renal cysts   Medical co-morbidities complicating care: HTN, h/o STEMI 2020 s/p stent placement (Dr. Humphrey Rolls is her cardiologist), RA (Dr. Precious Reel), hypothyroidism, Body mass index is 30.11 kg/m.  Plan:   Problem List Items Addressed This Visit      Genitourinary   Endometrial cancer ([place]) - Primary    Other Visit Diagnoses    Counseling and coordination of care       Renal cyst       Hepatic cyst          We discussed OR pathology results. Cytology negative for malignancy. MSI/MMR pending. We do not anticipate that further therapy is needed given noninvasive tumor and negative LVSI. Her risk of recurrence is ~10% Kimberly Burke et al. Lalla Brothers 2018). We reviewed NCCN guidelines and will present at Tumor Board.  Suggested return to clinic 4-5 weeks after for her postop check and thereafter for continued surveillance pending Tumor Board discussion. We can alternative visits with Dr. Gardiner Rhyme.   The patient's diagnosis, an outline of the further diagnostic and laboratory studies which will be required, the recommendation, and alternatives were discussed.  All questions were answered to the patient's satisfaction.  Beckey Rutter, NP  I personally had a face to face interaction and evaluated  the patient jointly with the NP, Ms. Beckey Rutter.  I have reviewed her history and available records and have performed the key portions of the physical exam including general, abdominal exam with my findings confirming those documented above by the APP.  I have discussed the case with the APP and the patient.  I agree with the above documentation, assessment and plan which was fully formulated by me.  Counseling was completed by me.   I personally saw the patient and performed a substantive portion of this encounter in conjunction with the listed APP as documented above.  Kinnick Maus Gaetana Michaelis, MD

## 2020-07-01 NOTE — Progress Notes (Signed)
Tumor Board Documentation  Kimberly Burke was presented by Mariea Clonts, RN at our Tumor Board on 07/01/2020, which included representatives from radiation oncology,surgical oncology,pathology,navigation,genetics (gynecology).  Kimberly Burke currently presents as a current patient,for new tumor(s),as a new patient with history of the following treatments: surgical intervention(s).  Additionally, we reviewed previous medical and familial history, history of present illness, and recent lab results along with all available histopathologic and imaging studies. The tumor board considered available treatment options and made the following recommendations: Active surveillance FIGO stage Ia. MMR pending. Dr. Theora Gianotti and Dr. Baruch Gouty recommend observation. Risk of recurrence in the 10% range. Her surgery was performed at [facility] and they will also discuss at their case conference.  The following procedures/referrals were also placed: No orders of the defined types were placed in this encounter.   Clinical Trial Status: not discussed   Staging used:    National site-specific guidelines   were discussed with respect to the case.  Tumor board is a meeting of clinicians from various specialty areas who evaluate and discuss patients for whom a multidisciplinary approach is being considered. Final determinations in the plan of care are those of the provider(s). The responsibility for follow up of recommendations given during tumor board is that of the provider.   Today's extended care, comprehensive team conference, Kimberly Burke was not present for the discussion and was not examined.

## 2020-07-09 DIAGNOSIS — I10 Essential (primary) hypertension: Secondary | ICD-10-CM | POA: Diagnosis not present

## 2020-07-09 DIAGNOSIS — R0602 Shortness of breath: Secondary | ICD-10-CM | POA: Diagnosis not present

## 2020-07-09 DIAGNOSIS — E782 Mixed hyperlipidemia: Secondary | ICD-10-CM | POA: Diagnosis not present

## 2020-07-09 DIAGNOSIS — I2510 Atherosclerotic heart disease of native coronary artery without angina pectoris: Secondary | ICD-10-CM | POA: Diagnosis not present

## 2020-07-29 ENCOUNTER — Inpatient Hospital Stay: Payer: Medicare Other

## 2020-07-29 ENCOUNTER — Inpatient Hospital Stay: Payer: Medicare Other | Attending: Obstetrics and Gynecology | Admitting: Obstetrics and Gynecology

## 2020-07-29 VITALS — BP 112/75 | HR 68 | Temp 98.0°F | Resp 17 | Wt 173.0 lb

## 2020-07-29 DIAGNOSIS — Z90722 Acquired absence of ovaries, bilateral: Secondary | ICD-10-CM | POA: Insufficient documentation

## 2020-07-29 DIAGNOSIS — I129 Hypertensive chronic kidney disease with stage 1 through stage 4 chronic kidney disease, or unspecified chronic kidney disease: Secondary | ICD-10-CM | POA: Diagnosis not present

## 2020-07-29 DIAGNOSIS — M069 Rheumatoid arthritis, unspecified: Secondary | ICD-10-CM | POA: Diagnosis not present

## 2020-07-29 DIAGNOSIS — C541 Malignant neoplasm of endometrium: Secondary | ICD-10-CM | POA: Insufficient documentation

## 2020-07-29 DIAGNOSIS — K7689 Other specified diseases of liver: Secondary | ICD-10-CM

## 2020-07-29 DIAGNOSIS — Z79899 Other long term (current) drug therapy: Secondary | ICD-10-CM | POA: Diagnosis not present

## 2020-07-29 DIAGNOSIS — N281 Cyst of kidney, acquired: Secondary | ICD-10-CM | POA: Insufficient documentation

## 2020-07-29 DIAGNOSIS — I252 Old myocardial infarction: Secondary | ICD-10-CM | POA: Diagnosis not present

## 2020-07-29 DIAGNOSIS — E039 Hypothyroidism, unspecified: Secondary | ICD-10-CM | POA: Diagnosis not present

## 2020-07-29 DIAGNOSIS — I2510 Atherosclerotic heart disease of native coronary artery without angina pectoris: Secondary | ICD-10-CM | POA: Insufficient documentation

## 2020-07-29 DIAGNOSIS — N189 Chronic kidney disease, unspecified: Secondary | ICD-10-CM | POA: Diagnosis not present

## 2020-07-29 DIAGNOSIS — Z7189 Other specified counseling: Secondary | ICD-10-CM

## 2020-07-29 DIAGNOSIS — Z90710 Acquired absence of both cervix and uterus: Secondary | ICD-10-CM | POA: Insufficient documentation

## 2020-07-29 NOTE — Progress Notes (Deleted)
Gynecologic Oncology Consult Visit   Referring Provider: Dr. 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 G3P3 female with h/o STEMI s/p stent placement who is seen in consultation from Dr. Gardiner Rhyme for high grade endometrial cancer s/p EUA, TLH-BSO with SLN mapping and dissection with Dr. Erasmo Leventhal at [facility] on  06/19/2020.   She presents today for final postoperative visit and discuss treatment recommendations.      Gynecologic Oncology History:  Kimberly Burke is a pleasant G53P3 female with h/o STEMI s/p stent placement who is seen in consultation from Dr. Gardiner Rhyme for high grade endometrial cancer s/p EUA, TLH-BSO with SLN mapping and dissection for stage Ia disease.  She presented initially with postmenopausal bleeding and was seen by Dr. Gardiner Rhyme for evaluation. As part of her evaluation with her primary care office a CT was obtained of the abdomen and pelvis. CT 05/22/2020 showed several findings:  There was a simple appearing cyst on the left lobe of the liver with simple with a maximum dimension of 4.3 cm.  This was not substantially changed from her prior exam.  There were multiple simple appearing right renal cyst primary in the mid and lower lobe the largest arising from the lower lobe measuring 3.8 cm.  2 other cyst in the right kidney were 2.5 to 3 cm in size.  There is no evidence of adrenal or renal mass or renal obstruction on the study obtained without IV contrast. Prominent appearance of the right ovary which contains a focus of calcification.  The right ovary measures approximately 4 cm. The endometrial stripe appears abnormally thickened for a postmenopausal patient measuring 1.7 cm. There is no fluid or free fluid noted in the pelvis.       05/27/2020 EMBx A. ENDOMETRIUM, BIOPSY:  -  High-grade endometrial carcinoma  -  Based on the biopsy, the carcinoma is favored to represent a mixed endometrioid and serous  carcinoma.  Uterus sounded to 8 cm  06/19/20 Underwent total laparoscopic hysterectomy, bilateral salpingoophorectomy, bilateral sentinel lymph node biopsies, pelvic washings. 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  Mismatch repair: INTACT A. Immunohistochemistry: Normal result. Expression of MLH1, MSH2, MSH6, and PMS2 is retained. B. PCR: Microsatellite stable (MSS).     % of Cells Staining Intensity Score Interpretation  ER IHC 31 1+ POSITIVE  PR IHC <1 2+ NEGATIVE  HER2/neu IHC 40 1+ NEGATIVE    Cytology Scant cytologically bland cells present, suggestive of endometrial cells.  Comment:  There is no evidence of malignancy.  A single group of bland cells resembling endometrial epithelium is present.  The differential diagnosis includes endometriosis versus refluxed benign endometrium.  Clinical correlation with the corresponding surgical pathology specimen ([id number]) is recommended.    Recommendations Tumor Board at [facility] active surveillance; [facility] Tumor Board recommend VBT. Can discuss systemic chemotherapy.  Problem List: Patient Active Problem List   Diagnosis Date Noted   Chronic kidney disease 06/03/2020   Venous stasis 06/03/2020   Endometrial cancer ([place]) 06/03/2020   NSTEMI (non-ST elevated myocardial infarction) ([place]) 12/03/2018   Hypothyroid 12/03/2018   Essential hypertension 12/03/2018   Rheumatoid arthritis ([place]) 12/03/2018   Degenerative arthritis 08/14/2013    Past Medical History: Past Medical History:  Diagnosis Date   Arthritis    Endometrial cancer ([place])  Hypertension    Hypothyroid    NSTEMI (non-ST elevated myocardial infarction) ([place])    Rheumatoid arthritis ([place])    Thyroid disease     Past Surgical History: Past Surgical History:  Procedure Laterality  Date   ABDOMINAL HYSTERECTOMY  06/19/2020   EUA, TLH-BSO with SLN mapping and dissection with Dr. Erasmo Leventhal at [place] 12/04/2018   Procedure: CORONARY STENT INTERVENTION;  Surgeon: Yolonda Kida, MD;  Location: [place] [facility];  Service: Cardiovascular;  Laterality: N/A;   LEFT HEART CATH AND CORONARY ANGIOGRAPHY N/A 12/04/2018   Procedure: LEFT HEART CATH AND CORONARY ANGIOGRAPHY;  Surgeon: Dionisio David, MD;  Location: [place] [facility];  Service: Cardiovascular;  Laterality: N/A;    Past Gynecologic History:  Menarche: 13 Menopause; 55 Last Pap unknown; no abnormal Pap   OB History: SVD x 3 OB History  Gravida Para Term Preterm AB Living  3 3          SAB IAB Ectopic Multiple Live Births               # Outcome Date GA Lbr Len/2nd Weight Sex Delivery Anes PTL Lv  3 Para           2 Para           1 Para             Family History: Family History  Problem Relation Age of Onset   Cancer Brother        prostate or colon unsure which     Social History: Social History   Socioeconomic History   Marital status: Widowed    Spouse name: Not on file   Number of children: Not on file   Years of education: Not on file   Highest education level: Not on file  Occupational History   Not on file  Tobacco Use   Smoking status: Former    Pack years: 0.00   Smokeless tobacco: Never  Vaping Use   Vaping Use: Never used  Substance and Sexual Activity   Alcohol use: Never   Drug use: Never   Sexual activity: Not Currently  Other Topics Concern   Not on file  Social History Narrative   Lives with daughter   Social Determinants of Health   Financial Resource Strain: Not on file  Food Insecurity: Not on file  Transportation Needs: Not on file  Physical Activity: Not on file  Stress: Not on file  Social Connections: Not on file  Intimate Partner Violence: Not on file    Allergies: Allergies  Allergen Reactions    Codeine Itching   Penicillins Other (See Comments)    unknown    Current Medications: Current Outpatient Medications  Medication Sig Dispense Refill   acetaminophen (TYLENOL) 650 MG CR tablet Take 1,300 mg by mouth daily.      aspirin 81 MG chewable tablet Chew 1 tablet (81 mg total) by mouth daily.     cetirizine (ZYRTEC ALLERGY) 10 MG tablet Take 1 tablet (10 mg total) by mouth daily. 30 tablet 0   Cholecalciferol 1.25 MG (50000 UT) capsule Take by mouth.     DULoxetine (CYMBALTA) 30 MG capsule Take 30 mg by mouth daily.     enalapril (VASOTEC) 2.5 MG tablet Take 1 tablet (2.5 mg total) by mouth daily. 30 tablet 1   fluticasone (FLONASE) 50 MCG/ACT nasal spray Place 1 spray into both nostrils daily.  folic acid (FOLVITE) 1 MG tablet Take by mouth.     hydrochlorothiazide (HYDRODIURIL) 25 MG tablet Take 1 tablet by mouth daily.     hydroxychloroquine (PLAQUENIL) 200 MG tablet Take 2 tablets by mouth daily.   0   levothyroxine (SYNTHROID, LEVOTHROID) 50 MCG tablet Take 1 tablet by mouth daily.     losartan (COZAAR) 25 MG tablet Take by mouth.     methotrexate (RHEUMATREX) 2.5 MG tablet Take 15 mg by mouth once a week.     metoprolol succinate (TOPROL-XL) 100 MG 24 hr tablet Take by mouth.     metoprolol tartrate (LOPRESSOR) 25 MG tablet Take 1 tablet (25 mg total) by mouth 2 (two) times daily. 60 tablet 1   mupirocin ointment (BACTROBAN) 2 % To affected area on chin till healed     rosuvastatin (CRESTOR) 40 MG tablet Take 1 tablet (40 mg total) by mouth daily at 6 PM. 30 tablet 1   ticagrelor (BRILINTA) 90 MG TABS tablet Take 1 tablet (90 mg total) by mouth 2 (two) times daily. 60 tablet 1   traMADol (ULTRAM) 50 MG tablet Take 50 mg by mouth 2 (two) times daily as needed.     No current facility-administered medications for this visit.    ***Review of Systems General: no complaints  HEENT: no complaints  Lungs: no complaints  Cardiac: no complaints  GI: no complaints  GU: no  complaints  Musculoskeletal: no complaints  Extremities: no complaints  Skin: no complaints  Neuro: no complaints  Endocrine: no complaints  Psych: no complaints        Objective:  Physical Examination:  There were no vitals taken for this visit.   ECOG Performance Status: 1 - Symptomatic but completely ambulatory  GENERAL: Patient is a well appearing female in no acute distress HEENT:  PERRL, neck supple with midline trachea. Thyroid without masses.  NODES:  No cervical, supraclavicular, axillary, or inguinal lymphadenopathy palpated.  LUNGS:  Clear to auscultation bilaterally.  No wheezes or rhonchi. HEART:  Regular rate and rhythm. No murmur appreciated. ABDOMEN:  Soft, nontender.  Positive, normoactive bowel sounds.  MSK:  No focal spinal tenderness to palpation. Full range of motion bilaterally in the upper extremities. EXTREMITIES:  No peripheral edema.   SKIN:  Clear with no obvious rashes or skin changes. No nail dyscrasia. NEURO:  Nonfocal. Well oriented.  Appropriate affect.  ***Pelvic: EGBUS: no lesions Cervix: no lesions, nontender, mobile Vagina: no lesions, no discharge or bleeding Uterus: normal size, nontender, mobile Adnexa: no palpable masses Rectovaginal: confirmatory    Lab Review N/A  Radiologic Imaging: As per HPI    Assessment:  ARMEDA PLUMB is a 80 y.o. 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   Right ovarian cyst with calcification  Hepatic and renal cysts   Medical co-morbidities complicating care: HTN, h/o STEMI 2020 s/p stent placement (Dr. Humphrey Rolls is her cardiologist), RA (Dr. Precious Reel), hypothyroidism, There is no height or weight on file to calculate BMI.  Plan:   Problem List Items Addressed This Visit   None   I reviewed NCCN guidelines. For stage 1A noninvasive options include VBT or observation if negative washings. Her washings did not definitively  demonstrate malignancy. We reviewed recommendations from [facility] and [facility] Tumor Board as well as NCCN guidelines. We reviewed pros and risks of additional therapy. She would like to ***  Suggested return to clinic in 3-4 months with CT scan  to follow up hepatic and renal cysts. 4-5 weeks. Plan for physical exam every 3-4 mo for 2-3 y, then every 6 mofor up to year 5, then annually. Imaging as clinically indicated.  Patient education regarding symptoms of potential recurrence, lifestyle, obesity, exercise, smoking cessation, sexual health, nutrition counseling provided ***.   We can alternative visits with Dr. Gardiner Rhyme after her next visit.   The patient's diagnosis, an outline of the further diagnostic and laboratory studies which will be required, the recommendation, and alternatives were discussed.  All questions were answered to the patient's satisfaction.  Stepfanie Yott Gaetana Michaelis, MD

## 2020-07-29 NOTE — Progress Notes (Signed)
Gynecologic Oncology Consult Visit   Referring Provider: Dr. 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 G3P3 female with h/o STEMI s/p stent placement who is seen in consultation from Dr. Gardiner Rhyme for high grade endometrial cancer s/p EUA, TLH-BSO with SLN mapping and dissection with Dr. Erasmo Leventhal at [facility] on  06/19/2020.   She presents today for final postoperative visit and discuss treatment recommendations.      Gynecologic Oncology History:  Kimberly Burke is a pleasant G29P3 female with h/o STEMI s/p stent placement who is seen in consultation from Dr. Gardiner Rhyme for high grade endometrial cancer s/p EUA, TLH-BSO with SLN mapping and dissection for stage Ia disease.  She presented initially with postmenopausal bleeding and was seen by Dr. Gardiner Rhyme for evaluation. As part of her evaluation with her primary care office a CT was obtained of the abdomen and pelvis. CT 05/22/2020 showed several findings:  There was a simple appearing cyst on the left lobe of the liver with simple with a maximum dimension of 4.3 cm.  This was not substantially changed from her prior exam.  There were multiple simple appearing right renal cyst primary in the mid and lower lobe the largest arising from the lower lobe measuring 3.8 cm.  2 other cyst in the right kidney were 2.5 to 3 cm in size.  There is no evidence of adrenal or renal mass or renal obstruction on the study obtained without IV contrast. Prominent appearance of the right ovary which contains a focus of calcification.  The right ovary measures approximately 4 cm. The endometrial stripe appears abnormally thickened for a postmenopausal patient measuring 1.7 cm. There is no fluid or free fluid noted in the pelvis.       05/27/2020 EMBx A. ENDOMETRIUM, BIOPSY:  -  High-grade endometrial carcinoma  -  Based on the biopsy, the carcinoma is favored to represent a mixed endometrioid and serous  carcinoma.  Uterus sounded to 8 cm  06/19/20 Underwent total laparoscopic hysterectomy, bilateral salpingoophorectomy, bilateral sentinel lymph node biopsies, pelvic washings. 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  Mismatch repair: INTACT A. Immunohistochemistry: Normal result. Expression of MLH1, MSH2, MSH6, and PMS2 is retained. B. PCR: Microsatellite stable (MSS).     % of Cells Staining Intensity Score Interpretation  ER IHC 31 1+ POSITIVE  PR IHC <1 2+ NEGATIVE  HER2/neu IHC 40 1+ NEGATIVE    Cytology Scant cytologically bland cells present, suggestive of endometrial cells.  Comment:  There is no evidence of malignancy.  A single group of bland cells resembling endometrial epithelium is present.  The differential diagnosis includes endometriosis versus refluxed benign endometrium.  Clinical correlation with the corresponding surgical pathology specimen ([id number]) is recommended.    Recommendations Tumor Board at [facility] active surveillance; [facility] Tumor Board recommend VBT. Can discuss systemic chemotherapy.  Problem List: Patient Active Problem List   Diagnosis Date Noted   Coronary artery disease involving native coronary artery of native heart 06/15/2020   Preoperative evaluation of a medical condition to rule out surgical contraindications (TAR required) 06/15/2020   Chronic kidney disease 06/03/2020   Venous stasis 06/03/2020   Endometrial cancer ([place]) 06/03/2020   NSTEMI (non-ST elevated myocardial infarction) ([place]) 12/03/2018   Hypothyroid 12/03/2018   Essential hypertension 12/03/2018   Rheumatoid arthritis (  [place]) 12/03/2018   Degenerative arthritis 08/14/2013    Past Medical History: Past Medical History:  Diagnosis Date   Arthritis    Endometrial cancer ([place])    Hypertension     Hypothyroid    NSTEMI (non-ST elevated myocardial infarction) ([place])    Rheumatoid arthritis ([place])    Thyroid disease     Past Surgical History: Past Surgical History:  Procedure Laterality Date   ABDOMINAL HYSTERECTOMY  06/19/2020   EUA, TLH-BSO with SLN mapping and dissection with Dr. Erasmo Leventhal at [place] 12/04/2018   Procedure: CORONARY [place];  Surgeon: Yolonda Kida, MD;  Location: [place] [facility];  Service: Cardiovascular;  Laterality: N/A;   LEFT HEART CATH AND CORONARY ANGIOGRAPHY N/A 12/04/2018   Procedure: LEFT HEART CATH AND CORONARY ANGIOGRAPHY;  Surgeon: Dionisio David, MD;  Location: [place] [facility];  Service: Cardiovascular;  Laterality: N/A;    Past Gynecologic History:  Menarche: 13 Menopause; 59 Last Pap unknown; no abnormal Pap   OB History: SVD x 3 OB History  Gravida Para Term Preterm AB Living  3 3          SAB IAB Ectopic Multiple Live Births               # Outcome Date GA Lbr Len/2nd Weight Sex Delivery Anes PTL Lv  3 Para           2 Para           1 Para             Family History: Family History  Problem Relation Age of Onset   Cancer Brother        prostate or colon unsure which     Social History: Social History   Socioeconomic History   Marital status: Widowed    Spouse name: Not on file   Number of children: Not on file   Years of education: Not on file   Highest education level: Not on file  Occupational History   Not on file  Tobacco Use   Smoking status: Former    Pack years: 0.00   Smokeless tobacco: Never  Vaping Use   Vaping Use: Never used  Substance and Sexual Activity   Alcohol use: Never   Drug use: Never   Sexual activity: Not Currently  Other Topics Concern   Not on file  Social History Narrative   Lives with daughter   Social Determinants of Health   Financial Resource Strain: Not on file  Food Insecurity: Not on file  Transportation  Needs: Not on file  Physical Activity: Not on file  Stress: Not on file  Social Connections: Not on file  Intimate Partner Violence: Not on file    Allergies: Allergies  Allergen Reactions   Codeine Itching   Penicillins Other (See Comments)    unknown    Current Medications: Current Outpatient Medications  Medication Sig Dispense Refill   acetaminophen (TYLENOL) 650 MG CR tablet Take 1,300 mg by mouth daily.      aspirin 81 MG chewable tablet Chew 1 tablet (81 mg total) by mouth daily.     cetirizine (ZYRTEC ALLERGY) 10 MG tablet Take 1 tablet (10 mg total) by mouth daily. 30 tablet 0   Cholecalciferol 1.25 MG (50000 UT) capsule Take by mouth.     DULoxetine (CYMBALTA) 30 MG capsule Take 30 mg by mouth daily.     enalapril (VASOTEC) 2.5  MG tablet Take 1 tablet (2.5 mg total) by mouth daily. 30 tablet 1   fluticasone (FLONASE) 50 MCG/ACT nasal spray Place 1 spray into both nostrils daily.     folic acid (FOLVITE) 1 MG tablet Take by mouth.     hydrochlorothiazide (HYDRODIURIL) 25 MG tablet Take 1 tablet by mouth daily.     hydroxychloroquine (PLAQUENIL) 200 MG tablet Take 2 tablets by mouth daily.   0   levothyroxine (SYNTHROID, LEVOTHROID) 50 MCG tablet Take 1 tablet by mouth daily.     losartan (COZAAR) 25 MG tablet Take by mouth.     methotrexate (RHEUMATREX) 2.5 MG tablet Take 15 mg by mouth once a week.     metoprolol succinate (TOPROL-XL) 100 MG 24 hr tablet Take by mouth.     metoprolol tartrate (LOPRESSOR) 25 MG tablet Take 1 tablet (25 mg total) by mouth 2 (two) times daily. 60 tablet 1   mupirocin ointment (BACTROBAN) 2 % To affected area on chin till healed     rosuvastatin (CRESTOR) 40 MG tablet Take 1 tablet (40 mg total) by mouth daily at 6 PM. 30 tablet 1   ticagrelor (BRILINTA) 90 MG TABS tablet Take 1 tablet (90 mg total) by mouth 2 (two) times daily. 60 tablet 1   traMADol (ULTRAM) 50 MG tablet Take 50 mg by mouth 2 (two) times daily as needed.     No current  facility-administered medications for this visit.   Review of Systems General: no complaints  HEENT: no complaints  Lungs: no complaints  Cardiac: no complaints  GI: no complaints  GU: no complaints  Musculoskeletal: no complaints  Extremities: no complaints  Skin: no complaints  Neuro: no complaints  Endocrine: no complaints  Psych: no complaints        Objective:  Physical Examination:  BP 112/75 (Patient Position: Sitting)   Pulse 68   Temp 98 F (36.7 C)   Resp 17   Wt 173 lb (78.5 kg)   SpO2 98%   BMI 30.65 kg/m    ECOG Performance Status: 1 - Symptomatic but completely ambulatory  GENERAL: Patient is a well appearing female in no acute distress ABDOMEN:  Soft, nontender, nondistended. No masses, ascites, or hernias.   SKIN:  Incisions healed NEURO:  Nonfocal. Well oriented.  Appropriate affect.  Pelvic: EGBUS: no lesions Cervix: surgically absent Vagina: no lesions, no discharge or bleeding, Cuff almost completely healed.  Uterus: surgically absent BME: no palpable masses   Lab Review N/A  Radiologic Imaging: As per HPI    Assessment:  Kimberly Burke is a 80 y.o. 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   Right ovarian cyst with calcification  Hepatic and renal cysts   Medical co-morbidities complicating care: HTN, h/o STEMI 2020 s/p stent placement (Dr. Humphrey Rolls is her cardiologist), RA (Dr. Precious Reel), hypothyroidism, Body mass index is 30.65 kg/m.  Plan:   Problem List Items Addressed This Visit       Genitourinary   Endometrial cancer [facility]) - Primary   Relevant Orders   CT Abdomen Pelvis Wo Contrast   Other Visit Diagnoses     Renal cyst       Hepatic cyst       Counseling and coordination of care            I reviewed NCCN guidelines. For stage 1A noninvasive options include VBT or observation if negative washings. Her washings did not definitively  demonstrate  malignancy. We reviewed recommendations from [facility] and [facility] Tumor Board as well as NCCN guidelines. We reviewed pros and risks of additional therapy. Risk of recurrence ranges from 10-22%. With radiation the recurrence rate would be reduced to ~5-7%. She would like to proceed with active surveillance at this time. She will contact me if she changes her mind and if so we will refer to Dr. Baruch Gouty.   Suggested return to clinic in 3-4 months with CT scan to follow up hepatic and renal cysts. 4-5 weeks. Plan for physical exam every 3-4 mo for 2-3 y, then every 6 mofor up to year 5, then annually. Imaging as clinically indicated.  Patient education regarding symptoms of potential recurrence, obesity, and sexual health.    We can alternative visits with Dr. Gardiner Rhyme after her next visit.   The patient's diagnosis, an outline of the further diagnostic and laboratory studies which will be required, the recommendation, and alternatives were discussed.  All questions were answered to the patient's satisfaction.  A total of 20 minutes were spent with the patient/family today; >50 % was spent in education, counseling and coordination of care for endometrial cancer.    Leida Luton Gaetana Michaelis, MD

## 2020-07-30 DIAGNOSIS — M069 Rheumatoid arthritis, unspecified: Secondary | ICD-10-CM | POA: Diagnosis not present

## 2020-07-30 DIAGNOSIS — I10 Essential (primary) hypertension: Secondary | ICD-10-CM | POA: Diagnosis not present

## 2020-07-30 DIAGNOSIS — E039 Hypothyroidism, unspecified: Secondary | ICD-10-CM | POA: Diagnosis not present

## 2020-07-30 DIAGNOSIS — E782 Mixed hyperlipidemia: Secondary | ICD-10-CM | POA: Diagnosis not present

## 2020-07-31 ENCOUNTER — Ambulatory Visit
Admission: RE | Admit: 2020-07-31 | Discharge: 2020-07-31 | Disposition: A | Discharge status: Home / Self Care | Payer: Medicare Other | Source: Ambulatory Visit | Attending: Urology | Admitting: Urology

## 2020-07-31 ENCOUNTER — Other Ambulatory Visit: Payer: Self-pay

## 2020-07-31 DIAGNOSIS — N281 Cyst of kidney, acquired: Secondary | ICD-10-CM | POA: Insufficient documentation

## 2020-08-03 ENCOUNTER — Telehealth: Payer: Self-pay | Admitting: *Deleted

## 2020-08-03 NOTE — Telephone Encounter (Addendum)
Patient informed, voiced understanding.   ----- Message from Hollice Espy, MD sent at 08/03/2020 12:28 PM EDT ----- Your renal ultrasound looks absolutely fine.  There are 2 renal cyst but they are simple in nature and do not require any further follow-up or surveillance.  Great news.  Hollice Espy, MD

## 2020-08-05 DIAGNOSIS — Z79899 Other long term (current) drug therapy: Secondary | ICD-10-CM | POA: Diagnosis not present

## 2020-08-05 DIAGNOSIS — M0579 Rheumatoid arthritis with rheumatoid factor of multiple sites without organ or systems involvement: Secondary | ICD-10-CM | POA: Diagnosis not present

## 2020-08-11 DIAGNOSIS — M0579 Rheumatoid arthritis with rheumatoid factor of multiple sites without organ or systems involvement: Secondary | ICD-10-CM | POA: Diagnosis not present

## 2020-08-11 DIAGNOSIS — I2510 Atherosclerotic heart disease of native coronary artery without angina pectoris: Secondary | ICD-10-CM | POA: Diagnosis not present

## 2020-08-11 DIAGNOSIS — G629 Polyneuropathy, unspecified: Secondary | ICD-10-CM | POA: Diagnosis not present

## 2020-08-11 DIAGNOSIS — Z79899 Other long term (current) drug therapy: Secondary | ICD-10-CM | POA: Diagnosis not present

## 2020-08-11 DIAGNOSIS — I2584 Coronary atherosclerosis due to calcified coronary lesion: Secondary | ICD-10-CM | POA: Diagnosis not present

## 2020-08-31 DIAGNOSIS — M069 Rheumatoid arthritis, unspecified: Secondary | ICD-10-CM | POA: Diagnosis not present

## 2020-08-31 DIAGNOSIS — E039 Hypothyroidism, unspecified: Secondary | ICD-10-CM | POA: Diagnosis not present

## 2020-08-31 DIAGNOSIS — E782 Mixed hyperlipidemia: Secondary | ICD-10-CM | POA: Diagnosis not present

## 2020-08-31 DIAGNOSIS — I10 Essential (primary) hypertension: Secondary | ICD-10-CM | POA: Diagnosis not present

## 2020-08-31 DIAGNOSIS — I2510 Atherosclerotic heart disease of native coronary artery without angina pectoris: Secondary | ICD-10-CM | POA: Diagnosis not present

## 2020-10-02 ENCOUNTER — Other Ambulatory Visit: Payer: Self-pay | Admitting: Internal Medicine

## 2020-10-05 DIAGNOSIS — L259 Unspecified contact dermatitis, unspecified cause: Secondary | ICD-10-CM | POA: Diagnosis not present

## 2020-11-03 DIAGNOSIS — G8929 Other chronic pain: Secondary | ICD-10-CM | POA: Diagnosis not present

## 2020-11-03 DIAGNOSIS — I2584 Coronary atherosclerosis due to calcified coronary lesion: Secondary | ICD-10-CM | POA: Diagnosis not present

## 2020-11-03 DIAGNOSIS — G629 Polyneuropathy, unspecified: Secondary | ICD-10-CM | POA: Diagnosis not present

## 2020-11-03 DIAGNOSIS — M25561 Pain in right knee: Secondary | ICD-10-CM | POA: Diagnosis not present

## 2020-11-03 DIAGNOSIS — I2510 Atherosclerotic heart disease of native coronary artery without angina pectoris: Secondary | ICD-10-CM | POA: Diagnosis not present

## 2020-11-03 DIAGNOSIS — M0579 Rheumatoid arthritis with rheumatoid factor of multiple sites without organ or systems involvement: Secondary | ICD-10-CM | POA: Diagnosis not present

## 2020-11-03 DIAGNOSIS — M25562 Pain in left knee: Secondary | ICD-10-CM | POA: Diagnosis not present

## 2020-11-12 DIAGNOSIS — G8929 Other chronic pain: Secondary | ICD-10-CM | POA: Diagnosis not present

## 2020-11-12 DIAGNOSIS — M25561 Pain in right knee: Secondary | ICD-10-CM | POA: Diagnosis not present

## 2020-11-12 DIAGNOSIS — M25562 Pain in left knee: Secondary | ICD-10-CM | POA: Diagnosis not present

## 2020-11-18 DIAGNOSIS — M0579 Rheumatoid arthritis with rheumatoid factor of multiple sites without organ or systems involvement: Secondary | ICD-10-CM | POA: Diagnosis not present

## 2020-11-27 ENCOUNTER — Ambulatory Visit
Admission: RE | Admit: 2020-11-27 | Discharge: 2020-11-27 | Disposition: A | Discharge status: Home / Self Care | Payer: Medicare Other | Source: Ambulatory Visit | Attending: Obstetrics and Gynecology | Admitting: Obstetrics and Gynecology

## 2020-11-27 ENCOUNTER — Other Ambulatory Visit: Payer: Self-pay

## 2020-11-27 DIAGNOSIS — C541 Malignant neoplasm of endometrium: Secondary | ICD-10-CM | POA: Diagnosis not present

## 2020-12-01 ENCOUNTER — Other Ambulatory Visit: Payer: Self-pay | Admitting: Internal Medicine

## 2020-12-01 DIAGNOSIS — E039 Hypothyroidism, unspecified: Secondary | ICD-10-CM | POA: Diagnosis not present

## 2020-12-01 DIAGNOSIS — R002 Palpitations: Secondary | ICD-10-CM | POA: Diagnosis not present

## 2020-12-01 DIAGNOSIS — Z1231 Encounter for screening mammogram for malignant neoplasm of breast: Secondary | ICD-10-CM

## 2020-12-01 DIAGNOSIS — I10 Essential (primary) hypertension: Secondary | ICD-10-CM | POA: Diagnosis not present

## 2020-12-01 DIAGNOSIS — I2510 Atherosclerotic heart disease of native coronary artery without angina pectoris: Secondary | ICD-10-CM | POA: Diagnosis not present

## 2020-12-01 DIAGNOSIS — H8113 Benign paroxysmal vertigo, bilateral: Secondary | ICD-10-CM | POA: Diagnosis not present

## 2020-12-01 DIAGNOSIS — E782 Mixed hyperlipidemia: Secondary | ICD-10-CM | POA: Diagnosis not present

## 2020-12-01 DIAGNOSIS — H6123 Impacted cerumen, bilateral: Secondary | ICD-10-CM | POA: Diagnosis not present

## 2020-12-02 ENCOUNTER — Inpatient Hospital Stay: Payer: Medicare Other | Attending: Obstetrics and Gynecology | Admitting: Nurse Practitioner

## 2020-12-02 ENCOUNTER — Other Ambulatory Visit: Payer: Self-pay

## 2020-12-02 VITALS — BP 130/73 | HR 76 | Temp 97.8°F | Resp 20 | Wt 176.6 lb

## 2020-12-02 DIAGNOSIS — Z08 Encounter for follow-up examination after completed treatment for malignant neoplasm: Secondary | ICD-10-CM | POA: Diagnosis not present

## 2020-12-02 DIAGNOSIS — C541 Malignant neoplasm of endometrium: Secondary | ICD-10-CM | POA: Diagnosis not present

## 2020-12-02 DIAGNOSIS — Z8542 Personal history of malignant neoplasm of other parts of uterus: Secondary | ICD-10-CM

## 2020-12-02 DIAGNOSIS — N83201 Unspecified ovarian cyst, right side: Secondary | ICD-10-CM | POA: Diagnosis not present

## 2020-12-02 DIAGNOSIS — M0579 Rheumatoid arthritis with rheumatoid factor of multiple sites without organ or systems involvement: Secondary | ICD-10-CM | POA: Diagnosis not present

## 2020-12-02 DIAGNOSIS — N281 Cyst of kidney, acquired: Secondary | ICD-10-CM | POA: Insufficient documentation

## 2020-12-02 DIAGNOSIS — M069 Rheumatoid arthritis, unspecified: Secondary | ICD-10-CM | POA: Diagnosis not present

## 2020-12-02 NOTE — Progress Notes (Signed)
Gynecologic Oncology Consult Visit   Referring Provider: Dr. 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 G3P3 female with h/o STEMI s/p stent placement who is seen in consultation from Dr. Gardiner Rhyme for high grade endometrial cancer s/p EUA, TLH-BSO with SLN mapping and dissection with Dr. Erasmo Leventhal at [facility] on 06/19/2020. She opted for active surveillance and returns for follow up.   In the interim she has udnergone CT abdomen pelvis wo contrast which was negative for recurrent or progressive disease. She is clinically asymptomatic. She feels well. Continues to exercise regularly.     Gynecologic Oncology History:  Kimberly Burke is a pleasant G42P3 female with h/o STEMI s/p stent placement who is seen in consultation from Dr. Gardiner Rhyme for high grade endometrial cancer s/p EUA, TLH-BSO with SLN mapping and dissection for stage Ia disease.  She presented initially with postmenopausal bleeding and was seen by Dr. Gardiner Rhyme for evaluation. As part of her evaluation with her primary care office a CT was obtained of the abdomen and pelvis. CT 05/22/2020 showed several findings:  There was a simple appearing cyst on the left lobe of the liver with simple with a maximum dimension of 4.3 cm.  This was not substantially changed from her prior exam.  There were multiple simple appearing right renal cyst primary in the mid and lower lobe the largest arising from the lower lobe measuring 3.8 cm.  2 other cyst in the right kidney were 2.5 to 3 cm in size.  There is no evidence of adrenal or renal mass or renal obstruction on the study obtained without IV contrast. Prominent appearance of the right ovary which contains a focus of calcification.  The right ovary measures approximately 4 cm. The endometrial stripe appears abnormally thickened for a postmenopausal patient measuring 1.7 cm. There is no fluid or free fluid noted in the pelvis.        05/27/2020 EMBx A. 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  Uterus sounded to 8 cm  06/19/20 Underwent total laparoscopic hysterectomy, bilateral salpingoophorectomy, bilateral sentinel lymph node biopsies, pelvic washings. 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  Mismatch repair: INTACT A. Immunohistochemistry: Normal result. Expression of MLH1, MSH2, MSH6, and PMS2 is retained. B. PCR: Microsatellite stable (MSS).     % of Cells Staining Intensity Score Interpretation  ER IHC 31 1+ POSITIVE  PR IHC <1 2+ NEGATIVE  HER2/neu IHC 40 1+ NEGATIVE    Cytology Scant cytologically bland cells present, suggestive of endometrial cells.  Comment:  There is no evidence of malignancy.  A single group of bland cells resembling endometrial epithelium is present.  The differential diagnosis includes endometriosis versus refluxed benign endometrium.  Clinical correlation with the corresponding surgical pathology specimen ([id number]) is recommended.    Recommendations Tumor Board at [facility] active surveillance; [facility] Tumor Board recommend VBT. Can discuss systemic chemotherapy.  NCCN guidelines reviewed. For stage 1A noninvasive options include VBT or observation if negative washings. Her washings did not definitively demonstrate malignancy. We reviewed recommendations from [facility] and [facility] Tumor Board as well as NCCN guidelines. We reviewed pros and risks of additional therapy. Risk of recurrence ranges from 10-22%. With radiation  the recurrence rate would be reduced to ~5-7%. She would like to proceed with active surveillance at this time. She will contact me if she changes her mind and if so we will  refer to Dr. Baruch Gouty.   Problem List: Patient Active Problem List   Diagnosis Date Noted   Coronary artery disease involving native coronary artery of native heart 06/15/2020   Preoperative evaluation of a medical condition to rule out surgical contraindications (TAR required) 06/15/2020   Chronic kidney disease 06/03/2020   Venous stasis 06/03/2020   Endometrial cancer ([place]) 06/03/2020   NSTEMI (non-ST elevated myocardial infarction) ([place]) 12/03/2018   Hypothyroid 12/03/2018   Essential hypertension 12/03/2018   Rheumatoid arthritis ([place]) 12/03/2018   Degenerative arthritis 08/14/2013    Past Medical History: Past Medical History:  Diagnosis Date   Arthritis    Endometrial cancer ([place])    Hypertension    Hypothyroid    NSTEMI (non-ST elevated myocardial infarction) ([place])    Rheumatoid arthritis ([place])    Thyroid disease     Past Surgical History: Past Surgical History:  Procedure Laterality Date   ABDOMINAL HYSTERECTOMY  06/19/2020   EUA, TLH-BSO with SLN mapping and dissection with Dr. Erasmo Leventhal at [facility] 12/04/2018   Procedure: CORONARY [place];  Surgeon: Yolonda Kida, MD;  Location: [place] [facility];  Service: Cardiovascular;  Laterality: N/A;   LEFT HEART CATH AND CORONARY ANGIOGRAPHY N/A 12/04/2018   Procedure: LEFT HEART CATH AND CORONARY ANGIOGRAPHY;  Surgeon: Dionisio David, MD;  Location: [place] [facility];  Service: Cardiovascular;  Laterality: N/A;    Past Gynecologic History:  Menarche: 13 Menopause; 56 Last Pap unknown; no abnormal Pap   OB History: SVD x 3 OB History  Gravida Para Term Preterm AB Living  3 3          SAB IAB Ectopic Multiple Live Births               # Outcome Date GA Lbr Len/2nd Weight Sex Delivery Anes PTL Lv  3 Para           2 Para           1 Para             Family History: Family History  Problem Relation Age of Onset   Cancer Brother        prostate or  colon unsure which     Social History: Social History   Socioeconomic History   Marital status: Widowed    Spouse name: Not on file   Number of children: Not on file   Years of education: Not on file   Highest education level: Not on file  Occupational History   Not on file  Tobacco Use   Smoking status: Former   Smokeless tobacco: Never  Vaping Use   Vaping Use: Never used  Substance and Sexual Activity   Alcohol use: Never   Drug use: Never   Sexual activity: Not Currently  Other Topics Concern   Not on file  Social History Narrative   Lives with daughter   Social Determinants of Health   Financial Resource Strain: Not on file  Food Insecurity: Not on file  Transportation Needs: Not on file  Physical Activity: Not on file  Stress: Not on file  Social Connections: Not on file  Intimate Partner Violence: Not on file    Allergies: Allergies  Allergen Reactions   Codeine  Itching   Penicillins Other (See Comments)    unknown    Current Medications: Current Outpatient Medications  Medication Sig Dispense Refill   acetaminophen (TYLENOL) 650 MG CR tablet Take 1,300 mg by mouth daily.      aspirin 81 MG chewable tablet Chew 1 tablet (81 mg total) by mouth daily.     Cholecalciferol 1.25 MG (50000 UT) capsule Take by mouth.     DULoxetine (CYMBALTA) 30 MG capsule Take 30 mg by mouth daily.     folic acid (FOLVITE) 1 MG tablet Take by mouth.     hydrochlorothiazide (HYDRODIURIL) 25 MG tablet Take 1 tablet by mouth daily.     hydroxychloroquine (PLAQUENIL) 200 MG tablet Take 2 tablets by mouth daily.   0   hydroxychloroquine (PLAQUENIL) 200 MG tablet Take by mouth.     levothyroxine (SYNTHROID) 50 MCG tablet Take by mouth.     levothyroxine (SYNTHROID, LEVOTHROID) 50 MCG tablet Take 1 tablet by mouth daily.     losartan (COZAAR) 25 MG tablet Take by mouth.     losartan (COZAAR) 25 MG tablet Take by mouth.     methotrexate (RHEUMATREX) 2.5 MG tablet Take 15 mg by  mouth once a week.     metoprolol succinate (TOPROL-XL) 100 MG 24 hr tablet Take by mouth.     metoprolol tartrate (LOPRESSOR) 25 MG tablet Take 1 tablet (25 mg total) by mouth 2 (two) times daily. 60 tablet 1   rosuvastatin (CRESTOR) 40 MG tablet Take 1 tablet (40 mg total) by mouth daily at 6 PM. 30 tablet 1   ticagrelor (BRILINTA) 90 MG TABS tablet Take 1 tablet (90 mg total) by mouth 2 (two) times daily. 60 tablet 1   cetirizine (ZYRTEC ALLERGY) 10 MG tablet Take 1 tablet (10 mg total) by mouth daily. (Patient not taking: Reported on 07/29/2020) 30 tablet 0   enalapril (VASOTEC) 2.5 MG tablet Take 1 tablet (2.5 mg total) by mouth daily. (Patient not taking: No sig reported) 30 tablet 1   fluticasone (FLONASE) 50 MCG/ACT nasal spray Place 1 spray into both nostrils daily. (Patient not taking: No sig reported)     mupirocin ointment (BACTROBAN) 2 % To affected area on chin till healed (Patient not taking: No sig reported)     traMADol (ULTRAM) 50 MG tablet Take 50 mg by mouth 2 (two) times daily as needed. (Patient not taking: No sig reported)     No current facility-administered medications for this visit.   Review of Systems General:  no complaints Skin: no complaints Eyes: no complaints HEENT: no complaints Breasts: no complaints Pulmonary: no complaints Cardiac: no complaints Gastrointestinal: no complaints Genitourinary/Sexual: no complaints Ob/Gyn: no complaints Musculoskeletal: no complaints Hematology: no complaints Neurologic/Psych: no complaints  Objective:  Physical Examination:  BP 130/73   Pulse 76   Temp 97.8 F (36.6 C)   Resp 20   Wt 176 lb 9.6 oz (80.1 kg)   SpO2 100%   BMI 31.28 kg/m    ECOG Performance Status: 1 - Symptomatic but completely ambulatory  GENERAL: Patient is a well appearing female in no acute distress HEENT:  Sclera clear. Anicteric NODES:  bilaterally enlarged 3-4 cm smooth submandibular lymph node; nontender LUNGS:  Clear to  auscultation bilaterally.   HEART:  Regular rate and rhythm.  ABDOMEN:  Soft, nontender.  No hernias, incisions well healed. No masses or ascites EXTREMITIES:  No peripheral edema. Atraumatic. No cyanosis SKIN:  Clear with no obvious rashes or skin  changes.  NEURO:  Nonfocal. Well oriented.  Appropriate affect.  Pelvic: Exam chaperoned by CMA.  EGBUS: no lesions Cervix: surgically absent Vagina: no lesions, no discharge or bleeding. Vaginal cuff well healed  Uterus: surgically absent BME: no palpable masses Rectal: confirmatory  Lab Review N/A  Radiologic Imaging: As per HPI  Assessment:  Kimberly Burke is a 80 y.o. female diagnosed with high grade endometrial cancer (pMMR, MSS, ER 1+; PR negative; Her2neu negative), stage IA noninvasive endometrial cancer with washings that did not definitively demonstrate malignancy. Discussed VBT vs surveillance post op and patient elected for surveillance. Clinically, asymptomatic. NED on exam. Interval CT scan was negative for radiographic evidence of disease.   Right ovarian cyst with calcification  Hepatic and renal cysts   Medical co-morbidities complicating care: HTN, h/o STEMI 2020 s/p stent placement (Dr. Humphrey Rolls is her cardiologist), RA (Dr. Precious Reel), hypothyroidism, Body mass index is 31.28 kg/m.  Plan:   Problem List Items Addressed This Visit       Genitourinary   Endometrial cancer (New[place])   Relevant Medications   hydroxychloroquine (PLAQUENIL) 200 MG tablet   Other Visit Diagnoses     Encounter for follow-up surveillance of endometrial cancer    -  Primary      Recommend continued surveillance. Will begin alternating surveillance visits with Dr. Gardiner Rhyme. She will see her in 3 months and we will see her Burke in 6 months. She will have physical exams every 3-4 months for 2-3 years then every 6 months for up to 5 years then annually. Imaging based on clinical findings or symptoms. Again reviewed survivorship topics  including symptoms of potential recurrence, maintaining healthy weight, avoidance of obesity, and sexual health.   The patient's diagnosis, an outline of the further diagnostic and laboratory studies which will be required, the recommendation, and alternatives were discussed.  All questions were answered to the patient's satisfaction.  Thank you for allowing me to participate in the care of this very pleasant patient.   Verlon Au, NP

## 2020-12-02 NOTE — Patient Instructions (Signed)
Call and make appointment with Dr. Gilman Schmidt for 3 months.

## 2020-12-04 DIAGNOSIS — E039 Hypothyroidism, unspecified: Secondary | ICD-10-CM | POA: Diagnosis not present

## 2020-12-04 DIAGNOSIS — E782 Mixed hyperlipidemia: Secondary | ICD-10-CM | POA: Diagnosis not present

## 2020-12-04 DIAGNOSIS — I2510 Atherosclerotic heart disease of native coronary artery without angina pectoris: Secondary | ICD-10-CM | POA: Diagnosis not present

## 2020-12-04 DIAGNOSIS — I351 Nonrheumatic aortic (valve) insufficiency: Secondary | ICD-10-CM | POA: Diagnosis not present

## 2020-12-04 DIAGNOSIS — I340 Nonrheumatic mitral (valve) insufficiency: Secondary | ICD-10-CM | POA: Diagnosis not present

## 2020-12-04 DIAGNOSIS — I10 Essential (primary) hypertension: Secondary | ICD-10-CM | POA: Diagnosis not present

## 2020-12-08 DIAGNOSIS — J3089 Other allergic rhinitis: Secondary | ICD-10-CM | POA: Diagnosis not present

## 2020-12-08 DIAGNOSIS — Z23 Encounter for immunization: Secondary | ICD-10-CM | POA: Diagnosis not present

## 2020-12-08 DIAGNOSIS — H6123 Impacted cerumen, bilateral: Secondary | ICD-10-CM | POA: Diagnosis not present

## 2020-12-08 DIAGNOSIS — I2510 Atherosclerotic heart disease of native coronary artery without angina pectoris: Secondary | ICD-10-CM | POA: Diagnosis not present

## 2020-12-08 DIAGNOSIS — I10 Essential (primary) hypertension: Secondary | ICD-10-CM | POA: Diagnosis not present

## 2020-12-08 DIAGNOSIS — E782 Mixed hyperlipidemia: Secondary | ICD-10-CM | POA: Diagnosis not present

## 2020-12-08 DIAGNOSIS — H8113 Benign paroxysmal vertigo, bilateral: Secondary | ICD-10-CM | POA: Diagnosis not present

## 2020-12-11 DIAGNOSIS — I2510 Atherosclerotic heart disease of native coronary artery without angina pectoris: Secondary | ICD-10-CM | POA: Diagnosis not present

## 2020-12-11 DIAGNOSIS — R002 Palpitations: Secondary | ICD-10-CM | POA: Diagnosis not present

## 2020-12-16 DIAGNOSIS — M0579 Rheumatoid arthritis with rheumatoid factor of multiple sites without organ or systems involvement: Secondary | ICD-10-CM | POA: Diagnosis not present

## 2021-01-04 DIAGNOSIS — I2584 Coronary atherosclerosis due to calcified coronary lesion: Secondary | ICD-10-CM | POA: Diagnosis not present

## 2021-01-04 DIAGNOSIS — M25562 Pain in left knee: Secondary | ICD-10-CM | POA: Diagnosis not present

## 2021-01-04 DIAGNOSIS — M0579 Rheumatoid arthritis with rheumatoid factor of multiple sites without organ or systems involvement: Secondary | ICD-10-CM | POA: Diagnosis not present

## 2021-01-04 DIAGNOSIS — I2510 Atherosclerotic heart disease of native coronary artery without angina pectoris: Secondary | ICD-10-CM | POA: Diagnosis not present

## 2021-01-04 DIAGNOSIS — G8929 Other chronic pain: Secondary | ICD-10-CM | POA: Diagnosis not present

## 2021-01-04 DIAGNOSIS — G629 Polyneuropathy, unspecified: Secondary | ICD-10-CM | POA: Diagnosis not present

## 2021-01-07 DIAGNOSIS — E782 Mixed hyperlipidemia: Secondary | ICD-10-CM | POA: Diagnosis not present

## 2021-01-07 DIAGNOSIS — I10 Essential (primary) hypertension: Secondary | ICD-10-CM | POA: Diagnosis not present

## 2021-01-07 DIAGNOSIS — H8111 Benign paroxysmal vertigo, right ear: Secondary | ICD-10-CM | POA: Diagnosis not present

## 2021-01-07 DIAGNOSIS — I2510 Atherosclerotic heart disease of native coronary artery without angina pectoris: Secondary | ICD-10-CM | POA: Diagnosis not present

## 2021-01-07 DIAGNOSIS — E039 Hypothyroidism, unspecified: Secondary | ICD-10-CM | POA: Diagnosis not present

## 2021-01-13 DIAGNOSIS — M0579 Rheumatoid arthritis with rheumatoid factor of multiple sites without organ or systems involvement: Secondary | ICD-10-CM | POA: Diagnosis not present

## 2021-01-14 ENCOUNTER — Ambulatory Visit
Admission: RE | Admit: 2021-01-14 | Discharge: 2021-01-14 | Disposition: A | Discharge status: Home / Self Care | Payer: Medicare Other | Source: Ambulatory Visit | Attending: Internal Medicine | Admitting: Internal Medicine

## 2021-01-14 ENCOUNTER — Other Ambulatory Visit: Payer: Self-pay

## 2021-01-14 DIAGNOSIS — Z1231 Encounter for screening mammogram for malignant neoplasm of breast: Secondary | ICD-10-CM | POA: Diagnosis not present

## 2021-02-10 DIAGNOSIS — M0579 Rheumatoid arthritis with rheumatoid factor of multiple sites without organ or systems involvement: Secondary | ICD-10-CM | POA: Diagnosis not present

## 2021-02-18 DIAGNOSIS — G629 Polyneuropathy, unspecified: Secondary | ICD-10-CM | POA: Diagnosis not present

## 2021-02-18 DIAGNOSIS — M25562 Pain in left knee: Secondary | ICD-10-CM | POA: Diagnosis not present

## 2021-02-18 DIAGNOSIS — M0579 Rheumatoid arthritis with rheumatoid factor of multiple sites without organ or systems involvement: Secondary | ICD-10-CM | POA: Diagnosis not present

## 2021-02-18 DIAGNOSIS — G8929 Other chronic pain: Secondary | ICD-10-CM | POA: Diagnosis not present

## 2021-03-15 DIAGNOSIS — J069 Acute upper respiratory infection, unspecified: Secondary | ICD-10-CM | POA: Diagnosis not present

## 2021-03-15 DIAGNOSIS — E039 Hypothyroidism, unspecified: Secondary | ICD-10-CM | POA: Diagnosis not present

## 2021-03-15 DIAGNOSIS — J3089 Other allergic rhinitis: Secondary | ICD-10-CM | POA: Diagnosis not present

## 2021-03-15 DIAGNOSIS — I10 Essential (primary) hypertension: Secondary | ICD-10-CM | POA: Diagnosis not present

## 2021-03-15 DIAGNOSIS — E782 Mixed hyperlipidemia: Secondary | ICD-10-CM | POA: Diagnosis not present

## 2021-03-15 DIAGNOSIS — I2510 Atherosclerotic heart disease of native coronary artery without angina pectoris: Secondary | ICD-10-CM | POA: Diagnosis not present

## 2021-03-17 DIAGNOSIS — M0579 Rheumatoid arthritis with rheumatoid factor of multiple sites without organ or systems involvement: Secondary | ICD-10-CM | POA: Diagnosis not present

## 2021-04-01 DIAGNOSIS — E782 Mixed hyperlipidemia: Secondary | ICD-10-CM | POA: Diagnosis not present

## 2021-04-01 DIAGNOSIS — I351 Nonrheumatic aortic (valve) insufficiency: Secondary | ICD-10-CM | POA: Diagnosis not present

## 2021-04-01 DIAGNOSIS — I219 Acute myocardial infarction, unspecified: Secondary | ICD-10-CM | POA: Diagnosis not present

## 2021-04-01 DIAGNOSIS — I10 Essential (primary) hypertension: Secondary | ICD-10-CM | POA: Diagnosis not present

## 2021-04-01 DIAGNOSIS — I2510 Atherosclerotic heart disease of native coronary artery without angina pectoris: Secondary | ICD-10-CM | POA: Diagnosis not present

## 2021-04-01 DIAGNOSIS — R109 Unspecified abdominal pain: Secondary | ICD-10-CM | POA: Diagnosis not present

## 2021-04-01 DIAGNOSIS — I340 Nonrheumatic mitral (valve) insufficiency: Secondary | ICD-10-CM | POA: Diagnosis not present

## 2021-04-01 DIAGNOSIS — E039 Hypothyroidism, unspecified: Secondary | ICD-10-CM | POA: Diagnosis not present

## 2021-04-12 DIAGNOSIS — R109 Unspecified abdominal pain: Secondary | ICD-10-CM | POA: Diagnosis not present

## 2021-04-14 DIAGNOSIS — M0579 Rheumatoid arthritis with rheumatoid factor of multiple sites without organ or systems involvement: Secondary | ICD-10-CM | POA: Diagnosis not present

## 2021-04-16 DIAGNOSIS — N200 Calculus of kidney: Secondary | ICD-10-CM | POA: Diagnosis not present

## 2021-04-19 DIAGNOSIS — I2510 Atherosclerotic heart disease of native coronary artery without angina pectoris: Secondary | ICD-10-CM | POA: Diagnosis not present

## 2021-04-19 DIAGNOSIS — I10 Essential (primary) hypertension: Secondary | ICD-10-CM | POA: Diagnosis not present

## 2021-04-19 DIAGNOSIS — E039 Hypothyroidism, unspecified: Secondary | ICD-10-CM | POA: Diagnosis not present

## 2021-04-19 DIAGNOSIS — S29012A Strain of muscle and tendon of back wall of thorax, initial encounter: Secondary | ICD-10-CM | POA: Diagnosis not present

## 2021-04-19 DIAGNOSIS — E782 Mixed hyperlipidemia: Secondary | ICD-10-CM | POA: Diagnosis not present

## 2021-04-29 DIAGNOSIS — I10 Essential (primary) hypertension: Secondary | ICD-10-CM | POA: Diagnosis not present

## 2021-04-29 DIAGNOSIS — S29012D Strain of muscle and tendon of back wall of thorax, subsequent encounter: Secondary | ICD-10-CM | POA: Diagnosis not present

## 2021-04-29 DIAGNOSIS — E039 Hypothyroidism, unspecified: Secondary | ICD-10-CM | POA: Diagnosis not present

## 2021-04-29 DIAGNOSIS — E782 Mixed hyperlipidemia: Secondary | ICD-10-CM | POA: Diagnosis not present

## 2021-05-13 DIAGNOSIS — M0579 Rheumatoid arthritis with rheumatoid factor of multiple sites without organ or systems involvement: Secondary | ICD-10-CM | POA: Diagnosis not present

## 2021-05-23 DIAGNOSIS — I10 Essential (primary) hypertension: Secondary | ICD-10-CM | POA: Diagnosis not present

## 2021-05-23 DIAGNOSIS — I2510 Atherosclerotic heart disease of native coronary artery without angina pectoris: Secondary | ICD-10-CM | POA: Diagnosis not present

## 2021-05-23 DIAGNOSIS — E039 Hypothyroidism, unspecified: Secondary | ICD-10-CM | POA: Diagnosis not present

## 2021-05-26 DIAGNOSIS — U071 COVID-19: Secondary | ICD-10-CM | POA: Diagnosis not present

## 2021-05-26 DIAGNOSIS — R6883 Chills (without fever): Secondary | ICD-10-CM | POA: Diagnosis not present

## 2021-06-09 ENCOUNTER — Inpatient Hospital Stay: Payer: Medicare Other | Attending: Obstetrics and Gynecology | Admitting: Obstetrics and Gynecology

## 2021-06-09 VITALS — BP 108/64 | HR 80 | Temp 98.7°F | Resp 18 | Wt 180.1 lb

## 2021-06-09 DIAGNOSIS — Z90722 Acquired absence of ovaries, bilateral: Secondary | ICD-10-CM | POA: Diagnosis not present

## 2021-06-09 DIAGNOSIS — C541 Malignant neoplasm of endometrium: Secondary | ICD-10-CM | POA: Insufficient documentation

## 2021-06-09 DIAGNOSIS — Z90710 Acquired absence of both cervix and uterus: Secondary | ICD-10-CM | POA: Insufficient documentation

## 2021-06-09 DIAGNOSIS — H35371 Puckering of macula, right eye: Secondary | ICD-10-CM | POA: Diagnosis not present

## 2021-06-09 NOTE — Progress Notes (Signed)
Gynecologic Oncology Consult Visit  ? ?Referring Provider: Dr. Adrian Prows ? ?Chief Concern: high grade endometrial cancer ? ?Subjective:  ?Kimberly Burke is a 81 y.o. G3P3 female with h/o STEMI s/p stent placement who is seen in consultation from Dr. Gardiner Rhyme for high grade endometrial cancer s/p EUA, TLH-BSO with SLN mapping and dissection with Dr. Erasmo Leventhal at [facility] on 06/19/2020. She opted for active surveillance and returns for follow up.  ? ?She was last seen in November 2022 by Beckey Rutter, NP. Mammogram performed in December 2022. She's never had a colonoscopy but is going to discuss with her PCP and is thinking about getting this test.  ? ? ?Gynecologic Oncology History:  ?Kimberly Burke is a pleasant G3P3 female with h/o STEMI s/p stent placement who is seen in consultation from Dr. Gardiner Rhyme for high grade endometrial cancer s/p EUA, TLH-BSO with SLN mapping and dissection for stage Ia disease. ? ?She presented initially with postmenopausal bleeding and was seen by Dr. Gardiner Rhyme for evaluation. As part of her evaluation with her primary care office a CT was obtained of the abdomen and pelvis. CT 05/22/2020 showed several findings:  There was a simple appearing cyst on the left lobe of the liver with simple with a maximum dimension of 4.3 cm.  This was not substantially changed from her prior exam.  There were multiple simple appearing right renal cyst primary in the mid and lower lobe the largest arising from the lower lobe measuring 3.8 cm.  2 other cyst in the right kidney were 2.5 to 3 cm in size.  There is no evidence of adrenal or renal mass or renal obstruction on the study obtained without IV contrast. Prominent appearance of the right ovary which contains a focus of calcification.  The right ovary measures approximately 4 cm. ?The endometrial stripe appears abnormally thickened for a postmenopausal patient measuring 1.7 cm. There is no fluid or free fluid noted in the pelvis. ?  ? ? ?   ?05/27/2020 EMBx ?A. 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  ?Uterus sounded to 8 cm ? ?06/19/20 Underwent total laparoscopic hysterectomy, bilateral salpingoophorectomy, bilateral sentinel lymph node biopsies, pelvic washings. 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 ? ?Mismatch repair: INTACT ?A. Immunohistochemistry: Normal result. Expression of MLH1, MSH2, MSH6, and PMS2 is retained. ?B. PCR: Microsatellite stable (MSS). ?  ?  % of Cells Staining Intensity Score Interpretation  ?ER IHC 31 1+ POSITIVE  ?PR IHC <1 2+ NEGATIVE  ?HER2/neu IHC 40 1+ NEGATIVE  ? ? ?Cytology ?Scant cytologically bland cells present, suggestive of endometrial cells.  Comment:  There is no evidence of malignancy.  A single group of bland cells resembling endometrial epithelium is present.  The differential diagnosis includes endometriosis versus refluxed benign endometrium.  Clinical correlation with the corresponding surgical pathology specimen ([id number]) is recommended.   ? ?Recommendations Tumor Board at [facility] active surveillance; [facility] Tumor Board recommend VBT. Can discuss systemic chemotherapy. ? ?NCCN guidelines reviewed. For stage 1A noninvasive options include VBT or observation if negative washings. Her washings did not definitively demonstrate malignancy. We reviewed recommendations from [facility] Tumor Board as well as NCCN guidelines. We reviewed pros and risks of additional therapy. Risk of recurrence  ranges from 10-22%. With radiation the recurrence rate would be reduced to ~5-7%. She would like to proceed with active surveillance at this time. She will contact me if she changes her mind and if so we will  refer to Dr. Baruch Gouty.  ? ?Problem List: ?Patient Active Problem List  ? Diagnosis Date Noted  ? Coronary artery disease involving native coronary artery of native heart 06/15/2020  ? Preoperative evaluation of a medical condition to rule out surgical contraindications (TAR required) 06/15/2020  ? Chronic kidney disease 06/03/2020  ? Venous stasis 06/03/2020  ? Endometrial cancer ([place]) 06/03/2020  ? NSTEMI (non-ST elevated myocardial infarction) ([place]) 12/03/2018  ? Hypothyroid 12/03/2018  ? Essential hypertension 12/03/2018  ? Rheumatoid arthritis ([place]) 12/03/2018  ? Degenerative arthritis 08/14/2013  ? ? ?Past Medical History: ?Past Medical History:  ?Diagnosis Date  ? Arthritis   ? Endometrial cancer ([place])   ? Hypertension   ? Hypothyroid   ? NSTEMI (non-ST elevated myocardial infarction) ([place])   ? Rheumatoid arthritis ([place])   ? Thyroid disease   ? ? ?Past Surgical History: ?Past Surgical History:  ?Procedure Laterality Date  ? ABDOMINAL HYSTERECTOMY  06/19/2020  ? EUA, TLH-BSO with SLN mapping and dissection with Dr. Erasmo Leventhal at [facility]   ? CORONARY STENT INTERVENTION N/A 12/04/2018  ? Procedure: CORONARY STENT INTERVENTION;  Surgeon: Yolonda Kida, MD;  Location: [place] [facility];  Service: Cardiovascular;  Laterality: N/A;  ? LEFT HEART CATH AND CORONARY ANGIOGRAPHY N/A 12/04/2018  ? Procedure: LEFT HEART CATH AND CORONARY ANGIOGRAPHY;  Surgeon: Dionisio David, MD;  Location: [place] [facility];  Service: Cardiovascular;  Laterality: N/A;  ? ? ?Past Gynecologic History:  ?Menarche: 13 ?Menopause; 41 ?Last Pap unknown; no abnormal Pap ? ? ?OB History: SVD x 3 ?OB History  ?Gravida Para Term Preterm AB Living  ?3 3          ?SAB IAB Ectopic Multiple Live Births  ?           ?  ?# Outcome Date GA Lbr Len/2nd Weight Sex Delivery Anes PTL Lv  ?3 Para           ?2 Para           ?1 Para           ? ? ?Family History: ?Family History  ?Problem Relation Age of Onset  ? Cancer Brother   ?     prostate or  colon unsure which   ? Breast cancer Neg Hx   ? ? ?Social History: ?Social History  ? ?Socioeconomic History  ? Marital status: Widowed  ?  Spouse name: Not on file  ? Number of children: Not on file  ? Years of education: Not on file  ? Highest education level: Not on file  ?Occupational History  ? Not on file  ?Tobacco Use  ? Smoking status: Former  ? Smokeless tobacco: Never  ?Vaping Use  ? Vaping Use: Never used  ?Substance and Sexual Activity  ? Alcohol use: Never  ? Drug use: Never  ? Sexual activity: Not Currently  ?Other Topics Concern  ? Not on file  ?Social History Narrative  ? Lives with daughter  ? ?Social Determinants of Health  ? ?Financial Resource Strain: Not on file  ?Food Insecurity: Not on file  ?Transportation Needs: Not on file  ?Physical Activity: Not on file  ?Stress: Not on file  ?Social Connections: Not on file  ?Intimate Partner Violence: Not on  file  ? ? ?Allergies: ?Allergies  ?Allergen Reactions  ? Codeine Itching  ? Penicillins Other (See Comments)  ?  unknown  ? ? ?Current Medications: ?Current Outpatient Medications  ?Medication Sig Dispense Refill  ? acetaminophen (TYLENOL) 650 MG CR tablet Take 1,300 mg by mouth daily.     ? aspirin 81 MG chewable tablet Chew 1 tablet (81 mg total) by mouth daily.    ? certolizumab pegol (CIMZIA) 2 X 200 MG/ML PSKT Inject into the skin.    ? Cholecalciferol 1.25 MG (50000 UT) capsule Take by mouth.    ? DULoxetine (CYMBALTA) 30 MG capsule Take 30 mg by mouth daily.    ? folic acid (FOLVITE) 1 MG tablet Take by mouth.    ? hydroxychloroquine (PLAQUENIL) 200 MG tablet Take 2 tablets by mouth daily.   0  ? hydroxychloroquine (PLAQUENIL) 200 MG tablet Take by mouth.    ? levothyroxine (SYNTHROID) 50 MCG tablet Take by mouth.    ? levothyroxine (SYNTHROID, LEVOTHROID) 50 MCG tablet Take 1 tablet by mouth daily.    ? losartan (COZAAR) 25 MG tablet Take by mouth.    ? losartan (COZAAR) 25 MG tablet Take by mouth.    ? methotrexate (RHEUMATREX) 2.5 MG  tablet Take 15 mg by mouth once a week.    ? metoprolol succinate (TOPROL-XL) 100 MG 24 hr tablet Take by mouth.    ? metoprolol tartrate (LOPRESSOR) 25 MG tablet Take 1 tablet (25 mg total) by mouth 2 (two) times dai

## 2021-06-10 DIAGNOSIS — M0579 Rheumatoid arthritis with rheumatoid factor of multiple sites without organ or systems involvement: Secondary | ICD-10-CM | POA: Diagnosis not present

## 2021-07-08 DIAGNOSIS — M0579 Rheumatoid arthritis with rheumatoid factor of multiple sites without organ or systems involvement: Secondary | ICD-10-CM | POA: Diagnosis not present

## 2021-07-13 DIAGNOSIS — M1812 Unilateral primary osteoarthritis of first carpometacarpal joint, left hand: Secondary | ICD-10-CM | POA: Diagnosis not present

## 2021-07-13 DIAGNOSIS — M19041 Primary osteoarthritis, right hand: Secondary | ICD-10-CM | POA: Diagnosis not present

## 2021-07-13 DIAGNOSIS — M0579 Rheumatoid arthritis with rheumatoid factor of multiple sites without organ or systems involvement: Secondary | ICD-10-CM | POA: Diagnosis not present

## 2021-07-13 DIAGNOSIS — Z79899 Other long term (current) drug therapy: Secondary | ICD-10-CM | POA: Diagnosis not present

## 2021-07-13 DIAGNOSIS — Z13820 Encounter for screening for osteoporosis: Secondary | ICD-10-CM | POA: Diagnosis not present

## 2021-07-13 DIAGNOSIS — M7989 Other specified soft tissue disorders: Secondary | ICD-10-CM | POA: Diagnosis not present

## 2021-08-03 DIAGNOSIS — I340 Nonrheumatic mitral (valve) insufficiency: Secondary | ICD-10-CM | POA: Diagnosis not present

## 2021-08-03 DIAGNOSIS — I2510 Atherosclerotic heart disease of native coronary artery without angina pectoris: Secondary | ICD-10-CM | POA: Diagnosis not present

## 2021-08-03 DIAGNOSIS — I351 Nonrheumatic aortic (valve) insufficiency: Secondary | ICD-10-CM | POA: Diagnosis not present

## 2021-08-03 DIAGNOSIS — I10 Essential (primary) hypertension: Secondary | ICD-10-CM | POA: Diagnosis not present

## 2021-08-03 DIAGNOSIS — R0602 Shortness of breath: Secondary | ICD-10-CM | POA: Diagnosis not present

## 2021-08-03 DIAGNOSIS — E782 Mixed hyperlipidemia: Secondary | ICD-10-CM | POA: Diagnosis not present

## 2021-08-03 DIAGNOSIS — I219 Acute myocardial infarction, unspecified: Secondary | ICD-10-CM | POA: Diagnosis not present

## 2021-08-05 DIAGNOSIS — M8588 Other specified disorders of bone density and structure, other site: Secondary | ICD-10-CM | POA: Diagnosis not present

## 2021-08-17 DIAGNOSIS — M0579 Rheumatoid arthritis with rheumatoid factor of multiple sites without organ or systems involvement: Secondary | ICD-10-CM | POA: Diagnosis not present

## 2021-08-23 DIAGNOSIS — I2510 Atherosclerotic heart disease of native coronary artery without angina pectoris: Secondary | ICD-10-CM | POA: Diagnosis not present

## 2021-08-23 DIAGNOSIS — E039 Hypothyroidism, unspecified: Secondary | ICD-10-CM | POA: Diagnosis not present

## 2021-08-23 DIAGNOSIS — I10 Essential (primary) hypertension: Secondary | ICD-10-CM | POA: Diagnosis not present

## 2021-09-01 ENCOUNTER — Ambulatory Visit: Payer: Self-pay | Admitting: *Deleted

## 2021-09-01 NOTE — Patient Outreach (Signed)
  Care Coordination   09/01/2021 Name: Kimberly Burke MRN: [id number] DOB: 07-26-1940   Care Coordination Outreach Attempts:  An unsuccessful telephone outreach was attempted today to offer the patient information about available care coordination services as a benefit of their health plan.   Follow Up Plan:  Additional outreach attempts will be made to offer the patient care coordination information and services.   Encounter Outcome:  No Answer  Care Coordination Interventions Activated:  No   Care Coordination Interventions:  No, not indicated     Makara Lanzo, [place] Worker  [facility] Care Management [phone]

## 2021-09-02 ENCOUNTER — Telehealth: Payer: Self-pay | Admitting: *Deleted

## 2021-09-02 NOTE — Patient Outreach (Signed)
  Care Coordination   Initial Visit Note   09/02/2021 Name: Kimberly Burke MRN: [id number] DOB: 1940-07-11  Kimberly Burke is a 81 y.o. year old female who sees Lavera Guise, MD for primary care. I spoke with  Kimberly Burke by phone today  What matters to the patients health and wellness today?  Scheduled with RNCM for follow up related to management of her hypertension, arthritis pain and related  medication questions   Goals Addressed               This Visit's Progress     care coordination activities (pt-stated)        Care Coordination Interventions:  SDOH screening completed Case Management program introduced Patient declined follow up with social worker but would like to be contacted by [facility] for management of her medical condition        SDOH assessments and interventions completed:  Yes  SDOH Interventions Today    Flowsheet Row Most Recent Value  SDOH Interventions   Food Insecurity Interventions Intervention Not Indicated  Housing Interventions Intervention Not Indicated  Physical Activity Interventions Intervention Not Indicated  Social Connections Interventions Intervention Not Indicated        Care Coordination Interventions Activated:  Yes  Care Coordination Interventions:  Yes, provided   Follow up plan: Follow up call scheduled for 09/09/21 with RNCM    Encounter Outcome:  Pt. Visit Completed

## 2021-09-02 NOTE — Patient Instructions (Signed)
Visit Information  Thank you for taking time to visit with me today. Please don't hesitate to contact me if I can be of assistance to you.   Following are the goals we discussed today:   Goals Addressed               This Visit's Progress     care coordination activities (pt-stated)        Care Coordination Interventions:  SDOH screening completed Case Management program introduced Patient declined follow up with social worker but would like to be contacted by [facility] for management of her medical condition        Our next appointment is by telephone on 09/09/21 at 10am  Please call the care guide team at [phone] if you need to cancel or reschedule your appointment.   If you are experiencing a Mental Health or [place] or need someone to talk to, please call the Suicide and Crisis Lifeline: 988   Patient verbalizes understanding of instructions and care plan provided today and agrees to view in [place]. Active MyChart status and patient understanding of how to access instructions and care plan via MyChart confirmed with patient.     Telephone follow up appointment with care management team member scheduled for: 09/09/21  Elliot Gurney, [place] Worker  [facility] Care Management [phone]

## 2021-09-09 ENCOUNTER — Ambulatory Visit: Payer: Self-pay

## 2021-09-09 NOTE — Patient Outreach (Signed)
  Care Coordination   Initial Visit Note   09/09/2021 Name: CHRISTIANN HAGERTY MRN: [id number] DOB: 03-Aug-1940  ASHLYNNE SHETTERLY is a 81 y.o. year old female who sees Lavera Guise, MD for primary care. I spoke with  [name] by phone today  What matters to the patients health and wellness today?  " Managing my rheumatoid and neuropathy pain    Goals Addressed             This Visit's Progress    Patient Stated:  Management of the pain due to rheumatoid arthritis/ neuropathy       Care Coordination Interventions: Reviewed provider established plan for pain management Discussed importance of adherence to all scheduled medical appointments Counseled on the importance of reporting any/all new or changed pain symptoms or management strategies to pain management provider Advised patient to report to care team affect of pain on daily activities Reviewed with patient prescribed pharmacological and nonpharmacological pain relief strategies          SDOH assessments and interventions completed:  Yes     Care Coordination Interventions Activated:  Yes  Care Coordination Interventions:  Yes, provided   Follow up plan: Follow up call scheduled for 10/19/21 at 10:00 am    Encounter Outcome:  Pt. Scheduled   [name] RN,BSN,CCM RN Care Manager Coordinator [phone]

## 2021-09-09 NOTE — Patient Instructions (Signed)
Visit Information  Thank you for taking time to visit with me today. Please don't hesitate to contact me if I can be of assistance to you.   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       Care Coordination Interventions: Reviewed provider established plan for pain management Discussed importance of adherence to all scheduled medical appointments Counseled on the importance of reporting any/all new or changed pain symptoms or management strategies to pain management provider Advised patient to report to care team affect of pain on daily activities Reviewed with patient prescribed pharmacological and nonpharmacological pain relief strategies          Our next appointment is by telephone on 10/19/21 at 10:00 am  Please call the care guide team at [phone] if you need to cancel or reschedule your appointment.   If you are experiencing a Mental Health or [place] or need someone to talk to, please call the Suicide and Crisis Lifeline: 988 call 1-800-273-TALK (toll free, 24 hour hotline)  The patient verbalized understanding of instructions, educational materials, and care plan provided today and DECLINED offer to receive copy of patient instructions, educational materials, and care plan.   The care management team will reach out to the patient again over the next 45 days.   [name] RN,BSN,CCM RN Care Manager Coordinator [phone]

## 2021-09-15 ENCOUNTER — Inpatient Hospital Stay: Payer: Medicare Other | Attending: Obstetrics and Gynecology | Admitting: Obstetrics and Gynecology

## 2021-09-15 VITALS — BP 117/69 | HR 73 | Temp 98.7°F | Resp 19 | Wt 187.7 lb

## 2021-09-15 DIAGNOSIS — M8580 Other specified disorders of bone density and structure, unspecified site: Secondary | ICD-10-CM | POA: Diagnosis not present

## 2021-09-15 DIAGNOSIS — Z90710 Acquired absence of both cervix and uterus: Secondary | ICD-10-CM | POA: Insufficient documentation

## 2021-09-15 DIAGNOSIS — Z90722 Acquired absence of ovaries, bilateral: Secondary | ICD-10-CM | POA: Insufficient documentation

## 2021-09-15 DIAGNOSIS — C541 Malignant neoplasm of endometrium: Secondary | ICD-10-CM | POA: Diagnosis not present

## 2021-09-15 DIAGNOSIS — Z8542 Personal history of malignant neoplasm of other parts of uterus: Secondary | ICD-10-CM | POA: Insufficient documentation

## 2021-09-15 NOTE — Progress Notes (Signed)
Gynecologic Oncology Consult Visit   Referring Provider: Dr. 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 G3P3 female with h/o STEMI s/p stent placement who is seen in consultation from Dr. Gardiner Rhyme for high grade endometrial cancer s/p EUA, TLH-BSO with SLN mapping and dissection with Dr. Erasmo Leventhal at [facility] on 06/19/2020. She opted for active surveillance and returns for follow up.   No complaints today.  Denies bleeding or discharge.  Recent bone density scan shows osteopenia.  Follows with Rheumatology for RA.   Gynecologic Oncology History:  Kimberly Burke is a pleasant G92P3 female with h/o STEMI s/p stent placement who is seen in consultation from Dr. Gardiner Rhyme for high grade endometrial cancer s/p EUA, TLH-BSO with SLN mapping and dissection for stage Ia disease.  She presented initially with postmenopausal bleeding and was seen by Dr. Gardiner Rhyme for evaluation. As part of her evaluation with her primary care office a CT was obtained of the abdomen and pelvis. CT 05/22/2020 showed several findings:  There was a simple appearing cyst on the left lobe of the liver with simple with a maximum dimension of 4.3 cm.  This was not substantially changed from her prior exam.  There were multiple simple appearing right renal cyst primary in the mid and lower lobe the largest arising from the lower lobe measuring 3.8 cm.  2 other cyst in the right kidney were 2.5 to 3 cm in size.  There is no evidence of adrenal or renal mass or renal obstruction on the study obtained without IV contrast. Prominent appearance of the right ovary which contains a focus of calcification.  The right ovary measures approximately 4 cm. The endometrial stripe appears abnormally thickened for a postmenopausal patient measuring 1.7 cm. There is no fluid or free fluid noted in the pelvis.       05/27/2020 EMBx A. 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  Uterus sounded to 8 cm  06/19/20 Underwent total laparoscopic hysterectomy, bilateral salpingoophorectomy, bilateral sentinel lymph node biopsies, pelvic washings. 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  Mismatch repair: INTACT A. Immunohistochemistry: Normal result. Expression of MLH1, MSH2, MSH6, and PMS2 is retained. B. PCR: Microsatellite stable (MSS).     % of Cells Staining Intensity Score Interpretation  ER IHC 31 1+ POSITIVE  PR IHC <1 2+ NEGATIVE  HER2/neu IHC 40 1+ NEGATIVE    Cytology Scant cytologically bland cells present, suggestive of endometrial cells.  Comment:  There is no evidence of malignancy.  A single group of bland cells resembling endometrial epithelium is present.  The differential diagnosis includes endometriosis versus refluxed benign endometrium.  Clinical correlation with the corresponding surgical pathology specimen ([id number]) is recommended.    Recommendations Tumor Board at [facility] active surveillance; [facility] Tumor Board recommend VBT. Can discuss systemic chemotherapy.  NCCN guidelines reviewed. For stage 1A noninvasive options include VBT or observation if negative washings. Her washings did not definitively demonstrate malignancy. We reviewed recommendations from [facility] and [facility] Tumor Board as well as NCCN guidelines. We reviewed pros and risks of additional therapy. Risk of recurrence ranges from 10-22%. With radiation the recurrence rate would be reduced to ~5-7%. She would like  to proceed with active surveillance at this time. She will contact me if she changes her mind and if so we will refer to Dr. Baruch Gouty.   Problem List: Patient Active Problem List    Diagnosis Date Noted   Coronary artery disease involving native coronary artery of native heart 06/15/2020   Preoperative evaluation of a medical condition to rule out surgical contraindications (TAR required) 06/15/2020   Chronic kidney disease 06/03/2020   Venous stasis 06/03/2020   Endometrial cancer ([place]) 06/03/2020   NSTEMI (non-ST elevated myocardial infarction) ([place]) 12/03/2018   Hypothyroid 12/03/2018   Essential hypertension 12/03/2018   Rheumatoid arthritis ([place]) 12/03/2018   Degenerative arthritis 08/14/2013    Past Medical History: Past Medical History:  Diagnosis Date   Arthritis    Endometrial cancer ([place])    Hypertension    Hypothyroid    NSTEMI (non-ST elevated myocardial infarction) ([place])    Rheumatoid arthritis ([place])    Thyroid disease     Past Surgical History: Past Surgical History:  Procedure Laterality Date   ABDOMINAL HYSTERECTOMY  06/19/2020   EUA, TLH-BSO with SLN mapping and dissection with Dr. Erasmo Leventhal at [place] 12/04/2018   Procedure: CORONARY [place];  Surgeon: Yolonda Kida, MD;  Location: [facility];  Service: Cardiovascular;  Laterality: N/A;   LEFT HEART CATH AND CORONARY ANGIOGRAPHY N/A 12/04/2018   Procedure: LEFT HEART CATH AND CORONARY ANGIOGRAPHY;  Surgeon: Dionisio David, MD;  Location: [facility];  Service: Cardiovascular;  Laterality: N/A;    Past Gynecologic History:  Menarche: 13 Menopause; 28 Last Pap unknown; no abnormal Pap   OB History: SVD x 3 OB History  Gravida Para Term Preterm AB Living  3 3          SAB IAB Ectopic Multiple Live Births               # Outcome Date GA Lbr Len/2nd Weight Sex Delivery Anes PTL Lv  3 Para           2 Para           1 Para             Family History: Family History  Problem Relation Age of Onset   Cancer Brother        prostate or colon unsure which    Breast cancer Neg Hx     Social History: Social  History   Socioeconomic History   Marital status: Widowed    Spouse name: Not on file   Number of children: Not on file   Years of education: Not on file   Highest education level: Not on file  Occupational History   Not on file  Tobacco Use   Smoking status: Former   Smokeless tobacco: Never  Vaping Use   Vaping Use: Never used  Substance and Sexual Activity   Alcohol use: Never   Drug use: Never   Sexual activity: Not Currently  Other Topics Concern   Not on file  Social History Narrative   Lives with daughter   Social Determinants of Health   Financial Resource Strain: Low Risk  (09/02/2021)   Overall Financial Resource Strain (CARDIA)    Difficulty of Paying Living Expenses: Not hard at all  Food Insecurity: No Food Insecurity (09/09/2021)   Hunger Vital Sign    Worried About Running Out of Food in the Last Year: Never true    Ran  Out of Food in the Last Year: Never true  Transportation Needs: No Transportation Needs (09/09/2021)   PRAPARE - Hydrologist (Medical): No    Lack of Transportation (Non-Medical): No  Physical Activity: Insufficiently Active (09/02/2021)   Exercise Vital Sign    Days of Exercise per Week: 5 days    Minutes of Exercise per Session: 10 min  Stress: No Stress Concern Present (09/02/2021)   [place]    Feeling of Stress : Not at all  Social Connections: Moderately Integrated (09/02/2021)   Social Connection and Isolation Panel [NHANES]    Frequency of Communication with Friends and Family: More than three times a week    Frequency of Social Gatherings with Friends and Family: More than three times a week    Attends Religious Services: More than 4 times per year    Active Member of [employer] or Organizations: Yes    Attends Archivist Meetings: More than 4 times per year    Marital Status: Widowed  Intimate Partner Violence: Not on file     Allergies: Allergies  Allergen Reactions   Codeine Itching   Penicillins Other (See Comments)    unknown    Current Medications: Current Outpatient Medications  Medication Sig Dispense Refill   acetaminophen (TYLENOL) 650 MG CR tablet Take 1,300 mg by mouth daily.      aspirin 81 MG chewable tablet Chew 1 tablet (81 mg total) by mouth daily.     certolizumab pegol (CIMZIA) 2 X 200 MG/ML PSKT Inject into the skin.     Cholecalciferol 1.25 MG (50000 UT) capsule Take by mouth.     DULoxetine (CYMBALTA) 30 MG capsule Take 30 mg by mouth daily.     folic acid (FOLVITE) 1 MG tablet Take by mouth.     gabapentin (NEURONTIN) 100 MG capsule Take 100 mg by mouth 3 (three) times daily.     hydroxychloroquine (PLAQUENIL) 200 MG tablet Take 2 tablets by mouth daily.   0   hydroxychloroquine (PLAQUENIL) 200 MG tablet Take by mouth.     levothyroxine (SYNTHROID) 50 MCG tablet Take by mouth.     levothyroxine (SYNTHROID, LEVOTHROID) 50 MCG tablet Take 1 tablet by mouth daily.     losartan (COZAAR) 25 MG tablet Take by mouth.     losartan (COZAAR) 25 MG tablet Take by mouth.     methotrexate (RHEUMATREX) 2.5 MG tablet Take 15 mg by mouth once a week.     metoprolol succinate (TOPROL-XL) 100 MG 24 hr tablet Take by mouth.     metoprolol tartrate (LOPRESSOR) 25 MG tablet Take 1 tablet (25 mg total) by mouth 2 (two) times daily. 60 tablet 1   mupirocin ointment (BACTROBAN) 2 %      rosuvastatin (CRESTOR) 40 MG tablet Take 1 tablet (40 mg total) by mouth daily at 6 PM. 30 tablet 1   ticagrelor (BRILINTA) 90 MG TABS tablet Take 1 tablet (90 mg total) by mouth 2 (two) times daily. 60 tablet 1   traMADol (ULTRAM) 50 MG tablet Take 50 mg by mouth 2 (two) times daily as needed.     cetirizine (ZYRTEC ALLERGY) 10 MG tablet Take 1 tablet (10 mg total) by mouth daily. (Patient not taking: Reported on 07/29/2020) 30 tablet 0   enalapril (VASOTEC) 2.5 MG tablet Take 1 tablet (2.5 mg total) by mouth daily.  (Patient not taking: Reported on 07/29/2020) 30 tablet 1  fluticasone (FLONASE) 50 MCG/ACT nasal spray Place 1 spray into both nostrils daily. (Patient not taking: Reported on 07/29/2020)     hydrochlorothiazide (HYDRODIURIL) 25 MG tablet Take 1 tablet by mouth daily. (Patient not taking: Reported on 06/09/2021)     No current facility-administered medications for this visit.   Review of Systems General:  no complaints Skin: no complaints Eyes: no complaints HEENT: no complaints Breasts: no complaints Pulmonary: no complaints Cardiac: no complaints Gastrointestinal: no complaints Genitourinary/Sexual: no complaints Ob/Gyn: no complaints Musculoskeletal: no complaints Hematology: no complaints Neurologic/Psych: no complaints   Objective:  Physical Examination:  BP 117/69   Pulse 73   Temp 98.7 F (37.1 C)   Resp 19   Wt 187 lb 11.2 oz (85.1 kg)   SpO2 97%   BMI 33.25 kg/m    ECOG Performance Status: 1 - Symptomatic but completely ambulatory  GENERAL: Patient is a well appearing female in no acute distress HEENT:  Sclera clear. Anicteric NODES:  No axillary, inguinal, or supraclavicular adenopathy LUNGS:  Normal respiratory rate   ABDOMEN:  Soft, nontender.  No hernias, incisions well healed. No masses or ascites EXTREMITIES:  No peripheral edema. Atraumatic. No cyanosis SKIN:  Clear with no obvious rashes or skin changes.  NEURO:  Nonfocal. Well oriented.  Appropriate affect.  Pelvic: Exam chaperoned by CMA.  EGBUS: no lesions Cervix: surgically absent Vagina: no lesions, no discharge or bleeding. Vaginal cuff well healed  Uterus: surgically absent BME: no palpable masses Rectal: confirmatory  Lab Review N/A  Radiologic Imaging: As per HPI  Assessment:  Kimberly Burke is a 81 y.o. female diagnosed with high grade endometrial cancer (pMMR, MSS, ER 1+; PR negative; Her2neu negative), stage IA noninvasive endometrial cancer with washings that did not definitively  demonstrate malignancy 06/19/20. Discussed VBT vs surveillance post op and patient elected for surveillance. Clinically, asymptomatic. NED on exam. Interval CT scan 11/22 was negative for radiographic evidence of disease.  NED on exam today.  Right ovarian cyst with calcification  Hepatic and renal cysts  Recent bone density scan 7/23 shows osteopenia.  Follows with Rheumatology for RA.   Medical co-morbidities complicating care: HTN, h/o STEMI 2020 s/p stent placement (Dr. Humphrey Rolls is her cardiologist), RA (Dr. Precious Reel), hypothyroidism, Body mass index is 33.25 kg/m.  Plan:   Problem List Items Addressed This Visit       Genitourinary   Endometrial cancer ([place]) - Primary   Recommend continued surveillance in 4 months.   The patient's diagnosis, an outline of the further diagnostic and laboratory studies which will be required, the recommendation, and alternatives were discussed.  All questions were answered to the patient's satisfaction. Beckey Rutter, NP scribed the note.   I personally had a face to face interaction and evaluated the patient; performed the physical exam, determined assessment and plan. Counseling was completed by me.   Verlon Au, NP  I personally interviewed and examined the patient. Agreed with the above/below plan of care. I have directly contributed to assessment and plan of care of this patient and educated and discussed with patient and family.  Mellody Drown, MD

## 2021-09-21 DIAGNOSIS — M0579 Rheumatoid arthritis with rheumatoid factor of multiple sites without organ or systems involvement: Secondary | ICD-10-CM | POA: Diagnosis not present

## 2021-10-19 ENCOUNTER — Ambulatory Visit: Payer: Self-pay

## 2021-10-19 DIAGNOSIS — M0579 Rheumatoid arthritis with rheumatoid factor of multiple sites without organ or systems involvement: Secondary | ICD-10-CM | POA: Diagnosis not present

## 2021-10-19 NOTE — Patient Outreach (Signed)
  Care Coordination   10/19/2021 Name: Kimberly Burke MRN: [id number] DOB: September 11, 1940   Care Coordination Outreach Attempts:  An unsuccessful telephone outreach was attempted for a scheduled appointment today.  Follow Up Plan:  Additional outreach attempts will be made to offer the patient care coordination information and services.   Encounter Outcome:  No Answer  Care Coordination Interventions Activated:  No   Care Coordination Interventions:  No, not indicated    [name] RN,BSN,CCM [place] [phone] direct line

## 2021-10-22 ENCOUNTER — Telehealth: Payer: Self-pay | Admitting: *Deleted

## 2021-10-22 NOTE — Chronic Care Management (AMB) (Signed)
  Care Coordination  Outreach Note  10/22/2021 Name: CHANIQUE DUCA MRN: [id number] DOB: 04/07/1940   Care Coordination Outreach Attempts: An unsuccessful telephone outreach was attempted today to offer the patient information about available care coordination services as a benefit of their health plan.   Rescheduling   Follow Up Plan:  Additional outreach attempts will be made to offer the patient care coordination information and services.   Encounter Outcome:  No Answer  Julian Hy, [place] Direct Dial: [phone]

## 2021-11-03 DIAGNOSIS — M159 Polyosteoarthritis, unspecified: Secondary | ICD-10-CM | POA: Diagnosis not present

## 2021-11-03 DIAGNOSIS — Z79899 Other long term (current) drug therapy: Secondary | ICD-10-CM | POA: Diagnosis not present

## 2021-11-03 DIAGNOSIS — M0579 Rheumatoid arthritis with rheumatoid factor of multiple sites without organ or systems involvement: Secondary | ICD-10-CM | POA: Diagnosis not present

## 2021-11-09 NOTE — Chronic Care Management (AMB) (Signed)
  Care Coordination Note  11/09/2021 Name: Kimberly Burke MRN: [id number] DOB: 1940/02/12  Kimberly Burke is a 81 y.o. year old female who is a primary care patient of Lavera Guise, MD and is actively engaged with the care management team. I reached out to [name] by phone today to assist with re-scheduling a follow up visit with the RN Case Manager  Follow up plan: 2nd Unsuccessful telephone outreach attempt made. A HIPAA compliant phone message was left for the patient providing contact information and requesting a return call.   Julian Hy, [place] Direct Dial: [phone]

## 2021-11-16 DIAGNOSIS — M0579 Rheumatoid arthritis with rheumatoid factor of multiple sites without organ or systems involvement: Secondary | ICD-10-CM | POA: Diagnosis not present

## 2021-11-16 NOTE — Chronic Care Management (AMB) (Signed)
  Care Coordination Note  11/16/2021 Name: ADER FRITZE MRN: [id number] DOB: Jun 14, 1940  Kimberly Burke is a 81 y.o. year old female who is a primary care patient of Lavera Guise, MD and is actively engaged with the care management team. I reached out to [name] by phone today to assist with re-scheduling a follow up visit with the RN Case Manager  Follow up plan: We have been unable to make contact with the patient for follow up.  Julian Hy, [place] Direct Dial: [phone]

## 2021-11-17 NOTE — Chronic Care Management (AMB) (Signed)
  Care Coordination Note  11/17/2021 Name: Kimberly Burke MRN: [id number] DOB: [birth year]  Kimberly Burke is a 81 y.o. year old female who is a primary care patient of Lavera Guise, MD and is actively engaged with the care management team. I reached out to [name] by phone today to assist with re-scheduling a follow up visit with the RN Case Manager  Follow up plan: Telephone appointment with care management team member scheduled for:11/24/2021  Julian Hy, [place] Direct Dial: [phone]

## 2021-11-24 ENCOUNTER — Ambulatory Visit: Payer: Self-pay

## 2021-11-24 NOTE — Patient Outreach (Signed)
  Care Coordination   11/24/2021 Name: Kimberly Burke MRN: [id number] DOB: 1940-11-19   Care Coordination Outreach Attempts:  An unsuccessful telephone outreach was attempted for a scheduled appointment today. HIPAA compliant message left with call back phone number.  Follow Up Plan:  Additional outreach attempts will be made to offer the patient care coordination information and services.   Encounter Outcome:  No Answer  Care Coordination Interventions Activated:  No   Care Coordination Interventions:  No, not indicated    [name] RN,BSN,CCM [place] [phone] direct line

## 2021-11-25 ENCOUNTER — Telehealth: Payer: Self-pay

## 2021-11-25 NOTE — Patient Outreach (Addendum)
  Care Coordination   11/25/2021 Name: Kimberly Burke MRN: [id number] DOB: 1940/09/13   Care Coordination Outreach Attempts:  An unsuccessful telephone outreach was attempted today to offer the patient information about available care coordination services as a benefit of their health plan.    Attempted return call to patient. Unable to reach. HIPAA compliant message left with call back phone number.   Follow Up Plan:  Additional outreach attempts will be made to offer the patient care coordination information and services.   Encounter Outcome:  No Answer  Care Coordination Interventions Activated:  No   Care Coordination Interventions:  No, not indicated    [name] RN,BSN,CCM [place] [phone] direct line

## 2021-11-29 ENCOUNTER — Telehealth: Payer: Self-pay | Admitting: *Deleted

## 2021-11-29 NOTE — Progress Notes (Unsigned)
  Care Coordination Note  11/29/2021 Name: Kimberly Burke MRN: [id number] DOB: 10-29-1940  Kimberly Burke is a 81 y.o. year old female who is a primary care patient of Lavera Guise, MD and is actively engaged with the care management team. I reached out to [name] by phone today to assist with re-scheduling a follow up visit with the RN Case Manager  Follow up plan: Unsuccessful telephone outreach attempt made. A HIPAA compliant phone message was left for the patient providing contact information and requesting a return call.   Julian Hy, [place] Direct Dial: [phone]

## 2021-11-30 NOTE — Progress Notes (Signed)
  Care Coordination Note  11/30/2021 Name: Kimberly Burke MRN: [id number] DOB: 27-Jul-1940  Kimberly Burke is a 81 y.o. year old female who is a primary care patient of Lavera Guise, MD and is actively engaged with the care management team. I reached out to [name] by phone today to assist with re-scheduling a follow up visit with the RN Case Manager  Follow up plan: Telephone appointment with care management team member scheduled for: 12/07/2021  Julian Hy, [place] Direct Dial: [phone]

## 2021-12-06 DIAGNOSIS — I2510 Atherosclerotic heart disease of native coronary artery without angina pectoris: Secondary | ICD-10-CM | POA: Diagnosis not present

## 2021-12-06 DIAGNOSIS — Z9861 Coronary angioplasty status: Secondary | ICD-10-CM | POA: Diagnosis not present

## 2021-12-06 DIAGNOSIS — R0602 Shortness of breath: Secondary | ICD-10-CM | POA: Diagnosis not present

## 2021-12-06 DIAGNOSIS — I219 Acute myocardial infarction, unspecified: Secondary | ICD-10-CM | POA: Diagnosis not present

## 2021-12-06 DIAGNOSIS — E782 Mixed hyperlipidemia: Secondary | ICD-10-CM | POA: Diagnosis not present

## 2021-12-06 DIAGNOSIS — I340 Nonrheumatic mitral (valve) insufficiency: Secondary | ICD-10-CM | POA: Diagnosis not present

## 2021-12-06 DIAGNOSIS — E039 Hypothyroidism, unspecified: Secondary | ICD-10-CM | POA: Diagnosis not present

## 2021-12-06 DIAGNOSIS — I351 Nonrheumatic aortic (valve) insufficiency: Secondary | ICD-10-CM | POA: Diagnosis not present

## 2021-12-06 DIAGNOSIS — I10 Essential (primary) hypertension: Secondary | ICD-10-CM | POA: Diagnosis not present

## 2021-12-07 ENCOUNTER — Ambulatory Visit: Payer: Self-pay

## 2021-12-07 NOTE — Patient Outreach (Signed)
  Care Coordination   12/07/2021 Name: Kimberly Burke MRN: [id number] DOB: 11-Sep-1940   Care Coordination Outreach Attempts:  An unsuccessful telephone outreach was attempted for a scheduled appointment today. HIPAA compliant voice message left with call back phone number.   Follow Up Plan:  Additional outreach attempts will be made to offer the patient care coordination information and services.   Encounter Outcome:  No Answer  Care Coordination Interventions Activated:  No   Care Coordination Interventions:  No, not indicated    [name] RN,BSN,CCM [place] [phone] direct line

## 2021-12-09 DIAGNOSIS — R0602 Shortness of breath: Secondary | ICD-10-CM | POA: Diagnosis not present

## 2021-12-13 DIAGNOSIS — I340 Nonrheumatic mitral (valve) insufficiency: Secondary | ICD-10-CM | POA: Diagnosis not present

## 2021-12-13 DIAGNOSIS — E782 Mixed hyperlipidemia: Secondary | ICD-10-CM | POA: Diagnosis not present

## 2021-12-13 DIAGNOSIS — I10 Essential (primary) hypertension: Secondary | ICD-10-CM | POA: Diagnosis not present

## 2021-12-13 DIAGNOSIS — R079 Chest pain, unspecified: Secondary | ICD-10-CM | POA: Diagnosis not present

## 2021-12-13 DIAGNOSIS — I351 Nonrheumatic aortic (valve) insufficiency: Secondary | ICD-10-CM | POA: Diagnosis not present

## 2021-12-13 DIAGNOSIS — I2510 Atherosclerotic heart disease of native coronary artery without angina pectoris: Secondary | ICD-10-CM | POA: Diagnosis not present

## 2021-12-21 ENCOUNTER — Ambulatory Visit: Payer: Self-pay

## 2021-12-21 NOTE — Patient Outreach (Signed)
  Care Coordination   12/21/2021 Name: Kimberly Burke MRN: [id number] DOB: [birth year]   Care Coordination Outreach Attempts:  An unsuccessful telephone outreach was attempted for a scheduled appointment today.  Follow Up Plan:  Additional outreach attempts will be made to offer the patient care coordination information and services.   Encounter Outcome:  No Answer   Care Coordination Interventions:  No, not indicated    [name] [facility] [name] [phone] direct line

## 2021-12-23 DIAGNOSIS — I10 Essential (primary) hypertension: Secondary | ICD-10-CM | POA: Diagnosis not present

## 2021-12-23 DIAGNOSIS — I2510 Atherosclerotic heart disease of native coronary artery without angina pectoris: Secondary | ICD-10-CM | POA: Diagnosis not present

## 2021-12-23 DIAGNOSIS — E039 Hypothyroidism, unspecified: Secondary | ICD-10-CM | POA: Diagnosis not present

## 2021-12-28 ENCOUNTER — Telehealth: Payer: Self-pay | Admitting: *Deleted

## 2021-12-28 NOTE — Progress Notes (Signed)
  Care Coordination Note  12/28/2021 Name: [name] MRN: [id number] DOB: [birth year]  Kimberly Burke is a 81 y.o. year old female who is a primary care patient of Lavera Guise, MD and is actively engaged with the care management team. I reached out to [name] by phone today to assist with re-scheduling a follow up visit with the RN Case Manager  Follow up plan: We have been unable to make contact with the patient for follow up. 3 missed follow ups with RN   Julian Hy, [place] Direct Dial: [phone]

## 2022-01-12 ENCOUNTER — Inpatient Hospital Stay: Payer: Medicare Other | Attending: Obstetrics and Gynecology | Admitting: Obstetrics and Gynecology

## 2022-01-12 ENCOUNTER — Encounter: Payer: Self-pay | Admitting: Obstetrics and Gynecology

## 2022-01-12 ENCOUNTER — Other Ambulatory Visit: Payer: Self-pay

## 2022-01-12 VITALS — BP 125/76 | HR 72 | Temp 98.1°F | Resp 18 | Ht 62.0 in | Wt 185.4 lb

## 2022-01-12 DIAGNOSIS — M549 Dorsalgia, unspecified: Secondary | ICD-10-CM | POA: Diagnosis not present

## 2022-01-12 DIAGNOSIS — Z90722 Acquired absence of ovaries, bilateral: Secondary | ICD-10-CM | POA: Diagnosis not present

## 2022-01-12 DIAGNOSIS — Z90710 Acquired absence of both cervix and uterus: Secondary | ICD-10-CM | POA: Diagnosis not present

## 2022-01-12 DIAGNOSIS — Z08 Encounter for follow-up examination after completed treatment for malignant neoplasm: Secondary | ICD-10-CM | POA: Diagnosis not present

## 2022-01-12 DIAGNOSIS — Z8542 Personal history of malignant neoplasm of other parts of uterus: Secondary | ICD-10-CM | POA: Diagnosis present

## 2022-01-12 NOTE — Progress Notes (Signed)
Gynecologic Oncology Consult Visit   Referring Provider: Dr. 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 G3P3 female with h/o STEMI s/p stent placement who is seen in consultation from Dr. Gardiner Rhyme for high grade endometrial cancer s/p EUA, TLH-BSO with SLN mapping and dissection with Dr. Erasmo Leventhal at [facility] on 06/19/2020. She opted for active surveillance and returns for follow up.   She continues to feel well and denies vaginal discharge or bleeding. No pelvic pain or changes in stool. Follows with Rheumatology for RA.   Last imaging was 11/30/20- CT Abdomen pelvis Wo contrast.     Gynecologic Oncology History:  Kimberly Burke is a pleasant G3P3 female with h/o STEMI s/p stent placement who is seen in consultation from Dr. Gardiner Rhyme for high grade endometrial cancer s/p EUA, TLH-BSO with SLN mapping and dissection for stage Ia disease.  She presented initially with postmenopausal bleeding and was seen by Dr. Gardiner Rhyme for evaluation. As part of her evaluation with her primary care office a CT was obtained of the abdomen and pelvis. CT 05/22/2020 showed several findings:  There was a simple appearing cyst on the left lobe of the liver with simple with a maximum dimension of 4.3 cm.  This was not substantially changed from her prior exam.  There were multiple simple appearing right renal cyst primary in the mid and lower lobe the largest arising from the lower lobe measuring 3.8 cm.  2 other cyst in the right kidney were 2.5 to 3 cm in size.  There is no evidence of adrenal or renal mass or renal obstruction on the study obtained without IV contrast. Prominent appearance of the right ovary which contains a focus of calcification.  The right ovary measures approximately 4 cm. The endometrial stripe appears abnormally thickened for a postmenopausal patient measuring 1.7 cm. There is no fluid or free fluid noted in the pelvis.        05/27/2020 EMBx A. 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  Uterus sounded to 8 cm  06/19/20 Underwent total laparoscopic hysterectomy, bilateral salpingoophorectomy, bilateral sentinel lymph node biopsies, pelvic washings. 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  Mismatch repair: INTACT A. Immunohistochemistry: Normal result. Expression of MLH1, MSH2, MSH6, and PMS2 is retained. B. PCR: Microsatellite stable (MSS).     % of Cells Staining Intensity Score Interpretation  ER IHC 31 1+ POSITIVE  PR IHC <1 2+ NEGATIVE  HER2/neu IHC 40 1+ NEGATIVE    Cytology Scant cytologically bland cells present, suggestive of endometrial cells.  Comment:  There is no evidence of malignancy.  A single group of bland cells resembling endometrial epithelium is present.  The differential diagnosis includes endometriosis versus refluxed benign endometrium.  Clinical correlation with the corresponding surgical pathology specimen ([id number]) is recommended.    Recommendations Tumor Board at [facility] active surveillance; [facility] Tumor Board recommend VBT. Can discuss systemic chemotherapy.  NCCN guidelines reviewed. For stage 1A noninvasive options include VBT or observation if negative washings. Her washings did not definitively demonstrate malignancy. We reviewed recommendations from [facility] and [facility] Tumor Board as well as NCCN guidelines. We reviewed pros and risks of additional therapy. Risk of recurrence ranges  from 10-22%. With radiation the recurrence rate would be reduced to ~5-7%. She would like to proceed with active surveillance at this time. She will contact me if she changes her mind and if so we will  refer to Dr. Baruch Gouty.   Problem List: Patient Active Problem List   Diagnosis Date Noted   Coronary artery disease involving native coronary artery of native heart 06/15/2020   Preoperative evaluation of a medical condition to rule out surgical contraindications (TAR required) 06/15/2020   Chronic kidney disease 06/03/2020   Venous stasis 06/03/2020   Endometrial cancer ([place]) 06/03/2020   NSTEMI (non-ST elevated myocardial infarction) ([place]) 12/03/2018   Hypothyroid 12/03/2018   Essential hypertension 12/03/2018   Rheumatoid arthritis ([place]) 12/03/2018   Degenerative arthritis 08/14/2013    Past Medical History: Past Medical History:  Diagnosis Date   Arthritis    Endometrial cancer ([place])    Hypertension    Hypothyroid    NSTEMI (non-ST elevated myocardial infarction) ([place])    Rheumatoid arthritis ([place])    Thyroid disease     Past Surgical History: Past Surgical History:  Procedure Laterality Date   ABDOMINAL HYSTERECTOMY  06/19/2020   EUA, TLH-BSO with SLN mapping and dissection with Dr. Erasmo Leventhal at [place] 12/04/2018   Procedure: CORONARY [place];  Surgeon: Yolonda Kida, MD;  Location: [place] [facility];  Service: Cardiovascular;  Laterality: N/A;   LEFT HEART CATH AND CORONARY ANGIOGRAPHY N/A 12/04/2018   Procedure: LEFT HEART CATH AND CORONARY ANGIOGRAPHY;  Surgeon: Dionisio David, MD;  Location: [place] [facility];  Service: Cardiovascular;  Laterality: N/A;    Past Gynecologic History:  Menarche: 13 Menopause; 45 Last Pap unknown; no abnormal Pap   OB History: SVD x 3 OB History  Gravida Para Term Preterm AB Living  3 3          SAB IAB Ectopic Multiple Live Births               # Outcome Date GA Lbr Len/2nd Weight Sex Delivery Anes PTL Lv  3 Para           2 Para           1 Para             Family History: Family History  Problem Relation Age of Onset   Cancer Brother        prostate or  colon unsure which    Breast cancer Neg Hx     Social History: Social History   Socioeconomic History   Marital status: Widowed    Spouse name: Not on file   Number of children: Not on file   Years of education: Not on file   Highest education level: Not on file  Occupational History   Not on file  Tobacco Use   Smoking status: Former   Smokeless tobacco: Never  Vaping Use   Vaping Use: Never used  Substance and Sexual Activity   Alcohol use: Never   Drug use: Never   Sexual activity: Not Currently  Other Topics Concern   Not on file  Social History Narrative   Lives with daughter   Social Determinants of Health   Financial Resource Strain: Low Risk  (09/02/2021)   Overall Financial Resource Strain (CARDIA)    Difficulty of Paying Living Expenses: Not hard at all  Food Insecurity: No Food Insecurity (09/09/2021)   Hunger Vital Sign    Worried  About Running Out of Food in the Last Year: Never true    Ran Out of Food in the Last Year: Never true  Transportation Needs: No Transportation Needs (09/09/2021)   PRAPARE - Hydrologist (Medical): No    Lack of Transportation (Non-Medical): No  Physical Activity: Insufficiently Active (09/02/2021)   Exercise Vital Sign    Days of Exercise per Week: 5 days    Minutes of Exercise per Session: 10 min  Stress: No Stress Concern Present (09/02/2021)   [place]    Feeling of Stress : Not at all  Social Connections: Moderately Integrated (09/02/2021)   Social Connection and Isolation Panel [NHANES]    Frequency of Communication with Friends and Family: More than three times a week    Frequency of Social Gatherings with Friends and Family: More than three times a week    Attends Religious Services: More than 4 times per year    Active Member of [employer] or Organizations: Yes    Attends Archivist Meetings: More than 4 times per year     Marital Status: Widowed  Intimate Partner Violence: Not on file    Allergies: Allergies  Allergen Reactions   Codeine Itching   Penicillins Other (See Comments)    unknown    Current Medications: Current Outpatient Medications  Medication Sig Dispense Refill   acetaminophen (TYLENOL) 650 MG CR tablet Take 1,300 mg by mouth daily.      aspirin 81 MG chewable tablet Chew 1 tablet (81 mg total) by mouth daily.     BRILINTA 60 MG TABS tablet Take 60 mg by mouth 2 (two) times daily.     celecoxib (CELEBREX) 200 MG capsule Take 200 mg by mouth daily.     Cholecalciferol 1.25 MG (50000 UT) capsule Take by mouth.     DULoxetine (CYMBALTA) 30 MG capsule Take 30 mg by mouth daily.     fluticasone (FLONASE) 50 MCG/ACT nasal spray Place 1 spray into both nostrils daily.     folic acid (FOLVITE) 1 MG tablet Take by mouth.     gabapentin (NEURONTIN) 100 MG capsule Take 100 mg by mouth 3 (three) times daily.     hydroxychloroquine (PLAQUENIL) 200 MG tablet Take by mouth.     levothyroxine (SYNTHROID, LEVOTHROID) 50 MCG tablet Take 1 tablet by mouth daily.     losartan (COZAAR) 25 MG tablet Take by mouth.     methotrexate (RHEUMATREX) 2.5 MG tablet Take 15 mg by mouth once a week.     metoprolol succinate (TOPROL-XL) 100 MG 24 hr tablet Take by mouth.     rosuvastatin (CRESTOR) 40 MG tablet Take 1 tablet (40 mg total) by mouth daily at 6 PM. 30 tablet 1   traMADol (ULTRAM) 50 MG tablet Take 50 mg by mouth 2 (two) times daily as needed.     No current facility-administered medications for this visit.   Review of Systems General:  no complaints Skin: no complaints Eyes: no complaints HEENT: no complaints Breasts: no complaints Pulmonary: no complaints Cardiac: no complaints Gastrointestinal: rectal bleeding x 1 episode of BRB due to hemorrhoid now resolved Genitourinary/Sexual: no complaints Ob/Gyn: no complaints Musculoskeletal: back pain- new Hematology: no  complaints Neurologic/Psych: no complaints   Objective:  Physical Examination:  BP 125/76   Pulse 72   Temp 98.1 F (36.7 C) (Oral)   Resp 18   Ht _0  (1.575 m)  Wt 185 lb 6.4 oz (84.1 kg)   BMI 33.91 kg/m    ECOG Performance Status: 1 - Symptomatic but completely ambulatory  GENERAL: Patient is a well appearing female in no acute distress HEENT:  Sclera clear. Anicteric NODES:  No axillary, inguinal, or supraclavicular adenopathy LUNGS:  Normal respiratory rate   ABDOMEN:  Soft, nontender, nondistended.  Upper abdominal wall laxity. No masses or ascites.  EXTREMITIES:  No peripheral edema. Atraumatic. No cyanosis SKIN:  Clear with no obvious rashes or skin changes.  NEURO:  Nonfocal. Well oriented.  Appropriate affect.  Pelvic: Exam chaperoned by NP EGBUS: no lesions Cervix: surgically absent Vagina: no lesions, no discharge or bleeding. Vaginal cuff well healed  Uterus: surgically absent BME: no palpable masses Rectal: deferred  Lab Review N/A  Radiologic Imaging: As per HPI  Assessment:  ZARYA LASSEIGNE is a 81 y.o. female diagnosed with high grade endometrial cancer (pMMR, MSS, ER 1+; PR negative; Her2neu negative), stage IA noninvasive endometrial cancer with washings that did not definitively demonstrate malignancy 06/19/20. Discussed VBT vs surveillance post op and patient elected for surveillance. Clinically, asymptomatic. NED on exam. Interval CT scan 11/22 was negative for radiographic evidence of disease.  NED on exam today.  Possible abdominal hernia versus laxity- imaging in November 2022 revealed small fat containing right lateral abdominal wall hernia, asymptomatic  Back pain, suspect due to arthritis   Right ovarian cyst with calcification  Hepatic and renal cysts  Recent bone density scan 7/23 shows osteopenia.  Follows with Rheumatology for RA.   Medical co-morbidities complicating care: HTN, h/o STEMI 2020 s/p stent placement (Dr. Humphrey Rolls is  her cardiologist), RA (Dr. Precious Reel), hypothyroidism, Body mass index is 33.91 kg/m.  Plan:   Problem List Items Addressed This Visit   None Visit Diagnoses     Encounter for follow-up surveillance of endometrial cancer    -  Primary      Previously reviewed surveillance guidelines. Recommend continued surveillance in 4 months.   If her back pain worsens she will contact us and we can get an imaging study to ensure there is no evidence of progressive disease  The patient's diagnosis, an outline of the further diagnostic and laboratory studies which will be required, the recommendation, and alternatives were discussed.  All questions were answered to the patient's satisfaction.  I personally had a face to face interaction and evaluated the patient jointly with the NP, Ms. Beckey Rutter.  I have reviewed her history and available records and have performed the key portions of the physical exam including lymph node survey, abdominal exam, pelvic exam with my findings confirming those documented above by the APP.  I have discussed the case with the APP and the patient.  I agree with the above documentation, assessment and plan which was fully formulated by me.  Counseling was completed by me.   I personally saw the patient and performed a substantive portion of this encounter in conjunction with the listed APP as documented above.  Kimberly Agudelo Gaetana Michaelis, MD

## 2022-01-13 DIAGNOSIS — R0602 Shortness of breath: Secondary | ICD-10-CM | POA: Diagnosis not present

## 2022-01-13 DIAGNOSIS — I219 Acute myocardial infarction, unspecified: Secondary | ICD-10-CM | POA: Diagnosis not present

## 2022-01-13 DIAGNOSIS — E782 Mixed hyperlipidemia: Secondary | ICD-10-CM | POA: Diagnosis not present

## 2022-01-13 DIAGNOSIS — I340 Nonrheumatic mitral (valve) insufficiency: Secondary | ICD-10-CM | POA: Diagnosis not present

## 2022-01-13 DIAGNOSIS — I2510 Atherosclerotic heart disease of native coronary artery without angina pectoris: Secondary | ICD-10-CM | POA: Diagnosis not present

## 2022-01-13 DIAGNOSIS — I10 Essential (primary) hypertension: Secondary | ICD-10-CM | POA: Diagnosis not present

## 2022-01-13 DIAGNOSIS — I351 Nonrheumatic aortic (valve) insufficiency: Secondary | ICD-10-CM | POA: Diagnosis not present

## 2022-01-27 DIAGNOSIS — M7581 Other shoulder lesions, right shoulder: Secondary | ICD-10-CM | POA: Diagnosis not present

## 2022-01-27 DIAGNOSIS — I2510 Atherosclerotic heart disease of native coronary artery without angina pectoris: Secondary | ICD-10-CM | POA: Diagnosis not present

## 2022-01-27 DIAGNOSIS — E782 Mixed hyperlipidemia: Secondary | ICD-10-CM | POA: Diagnosis not present

## 2022-01-27 DIAGNOSIS — I10 Essential (primary) hypertension: Secondary | ICD-10-CM | POA: Diagnosis not present

## 2022-01-27 DIAGNOSIS — E039 Hypothyroidism, unspecified: Secondary | ICD-10-CM | POA: Diagnosis not present

## 2022-01-27 DIAGNOSIS — M25511 Pain in right shoulder: Secondary | ICD-10-CM | POA: Diagnosis not present

## 2022-02-03 DIAGNOSIS — I340 Nonrheumatic mitral (valve) insufficiency: Secondary | ICD-10-CM | POA: Diagnosis not present

## 2022-02-03 DIAGNOSIS — I2510 Atherosclerotic heart disease of native coronary artery without angina pectoris: Secondary | ICD-10-CM | POA: Diagnosis not present

## 2022-02-03 DIAGNOSIS — E039 Hypothyroidism, unspecified: Secondary | ICD-10-CM | POA: Diagnosis not present

## 2022-02-03 DIAGNOSIS — M7581 Other shoulder lesions, right shoulder: Secondary | ICD-10-CM | POA: Diagnosis not present

## 2022-02-03 DIAGNOSIS — E782 Mixed hyperlipidemia: Secondary | ICD-10-CM | POA: Diagnosis not present

## 2022-02-03 DIAGNOSIS — R002 Palpitations: Secondary | ICD-10-CM | POA: Diagnosis not present

## 2022-02-03 DIAGNOSIS — I10 Essential (primary) hypertension: Secondary | ICD-10-CM | POA: Diagnosis not present

## 2022-02-03 DIAGNOSIS — R0602 Shortness of breath: Secondary | ICD-10-CM | POA: Diagnosis not present

## 2022-02-03 DIAGNOSIS — I351 Nonrheumatic aortic (valve) insufficiency: Secondary | ICD-10-CM | POA: Diagnosis not present

## 2022-02-03 DIAGNOSIS — R0789 Other chest pain: Secondary | ICD-10-CM | POA: Diagnosis not present

## 2022-02-08 ENCOUNTER — Other Ambulatory Visit: Payer: Self-pay | Admitting: Internal Medicine

## 2022-02-08 DIAGNOSIS — M25511 Pain in right shoulder: Secondary | ICD-10-CM

## 2022-02-22 ENCOUNTER — Ambulatory Visit
Admission: RE | Admit: 2022-02-22 | Discharge: 2022-02-22 | Disposition: A | Discharge status: Home / Self Care | Payer: Medicare Other | Source: Ambulatory Visit | Attending: Internal Medicine | Admitting: Internal Medicine

## 2022-02-22 DIAGNOSIS — I2510 Atherosclerotic heart disease of native coronary artery without angina pectoris: Secondary | ICD-10-CM

## 2022-02-22 DIAGNOSIS — I10 Essential (primary) hypertension: Secondary | ICD-10-CM | POA: Diagnosis not present

## 2022-02-22 DIAGNOSIS — M25511 Pain in right shoulder: Secondary | ICD-10-CM | POA: Insufficient documentation

## 2022-02-22 DIAGNOSIS — E039 Hypothyroidism, unspecified: Secondary | ICD-10-CM

## 2022-02-24 ENCOUNTER — Ambulatory Visit (INDEPENDENT_AMBULATORY_CARE_PROVIDER_SITE_OTHER): Payer: Medicare Other | Admitting: Internal Medicine

## 2022-02-24 ENCOUNTER — Encounter: Payer: Self-pay | Admitting: Internal Medicine

## 2022-02-24 VITALS — BP 124/72 | HR 79 | Ht 62.5 in | Wt 186.0 lb

## 2022-02-24 DIAGNOSIS — M12811 Other specific arthropathies, not elsewhere classified, right shoulder: Secondary | ICD-10-CM | POA: Insufficient documentation

## 2022-02-24 DIAGNOSIS — M75101 Unspecified rotator cuff tear or rupture of right shoulder, not specified as traumatic: Secondary | ICD-10-CM

## 2022-02-24 DIAGNOSIS — M05711 Rheumatoid arthritis with rheumatoid factor of right shoulder without organ or systems involvement: Secondary | ICD-10-CM

## 2022-02-24 DIAGNOSIS — E039 Hypothyroidism, unspecified: Secondary | ICD-10-CM | POA: Diagnosis not present

## 2022-02-24 HISTORY — DX: Unspecified rotator cuff tear or rupture of right shoulder, not specified as traumatic: M75.101

## 2022-02-24 NOTE — Progress Notes (Signed)
   Subjective:    Patient ID: Kimberly Burke, female    DOB: 17-Jan-1941, 82 y.o.   MRN: [id number]  HPI Patient comes in to discuss her Right shoulder pain. MRI results discussed- shows intrinsic tears in rotator cuff tendons- and complete tear of Biceps tendon with retraction. Patient will be getting a follow up with Orthopedics for further management.    Review of Systems  Cardiovascular:  Positive for palpitations.  Musculoskeletal:  Positive for arthralgias, joint swelling and myalgias.  All other systems reviewed and are negative.      Objective:   Physical Exam Vitals and nursing note reviewed. Exam conducted with a chaperone present.  Constitutional:      Appearance: Normal appearance.  HENT:     Head: Normocephalic.  Cardiovascular:     Rate and Rhythm: Normal rate and regular rhythm.  Pulmonary:     Effort: Pulmonary effort is normal.     Breath sounds: Normal breath sounds.  Abdominal:     General: Abdomen is flat. Bowel sounds are normal.  Musculoskeletal:        General: Tenderness present.     Right shoulder: Tenderness present. Decreased strength.     Left shoulder: Normal.     Cervical back: Normal range of motion.  Skin:    General: Skin is warm and dry.  Neurological:     General: No focal deficit present.     Mental Status: She is alert.           Assessment & Plan:  Frankye was seen today for 3 week follow up.  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

## 2022-02-25 LAB — TSH: TSH: 1.94 u[IU]/mL (ref 0.450–4.500)

## 2022-02-25 NOTE — Progress Notes (Signed)
Let pt know her TSH  ia wnl.

## 2022-03-01 ENCOUNTER — Other Ambulatory Visit: Payer: Self-pay | Admitting: Internal Medicine

## 2022-03-01 DIAGNOSIS — M05731 Rheumatoid arthritis with rheumatoid factor of right wrist without organ or systems involvement: Secondary | ICD-10-CM

## 2022-03-02 DIAGNOSIS — M75101 Unspecified rotator cuff tear or rupture of right shoulder, not specified as traumatic: Secondary | ICD-10-CM | POA: Diagnosis not present

## 2022-03-02 DIAGNOSIS — M0579 Rheumatoid arthritis with rheumatoid factor of multiple sites without organ or systems involvement: Secondary | ICD-10-CM | POA: Diagnosis not present

## 2022-03-02 DIAGNOSIS — Z79899 Other long term (current) drug therapy: Secondary | ICD-10-CM | POA: Diagnosis not present

## 2022-03-10 ENCOUNTER — Other Ambulatory Visit: Payer: Self-pay | Admitting: Cardiovascular Disease

## 2022-03-14 ENCOUNTER — Other Ambulatory Visit: Payer: Self-pay

## 2022-03-14 MED ORDER — BRILINTA 60 MG PO TABS: 60.0000 mg | ORAL_TABLET | Freq: Two times a day (BID) | ORAL | 3 refills | Status: DC

## 2022-03-15 ENCOUNTER — Other Ambulatory Visit: Payer: Self-pay

## 2022-03-15 MED ORDER — PANTOPRAZOLE SODIUM 40 MG PO TBEC: 40.0000 mg | DELAYED_RELEASE_TABLET | Freq: Every day | ORAL | 3 refills | Status: DC

## 2022-03-15 MED ORDER — HYDROCHLOROTHIAZIDE 25 MG PO TABS: 25.0000 mg | ORAL_TABLET | Freq: Every day | ORAL | 3 refills | Status: DC

## 2022-03-16 ENCOUNTER — Other Ambulatory Visit: Payer: Self-pay

## 2022-03-16 MED ORDER — PANTOPRAZOLE SODIUM 40 MG PO TBEC: 40.0000 mg | DELAYED_RELEASE_TABLET | Freq: Every day | ORAL | 3 refills | Status: DC

## 2022-03-16 MED ORDER — BRILINTA 60 MG PO TABS: 60.0000 mg | ORAL_TABLET | Freq: Two times a day (BID) | ORAL | 3 refills | Status: DC

## 2022-03-30 ENCOUNTER — Telehealth: Payer: Self-pay

## 2022-03-30 NOTE — Telephone Encounter (Signed)
HTN Review Call  [place]  19 years, Female  DOB: September 04, 1940  M:   __________________________________________________ Hypertension Review (HC) Chart Review BP #1 reading (last): 124/72 on: 02/24/2022 BP #2 reading: 124/80 on: 02/03/2022 BP #3 reading: 125/80 on: 02/03/2022 Any of the last 3 BP > 140/90 mmHg?: No What recent interventions have been made by any provider to improve the patient's conditions in the last 3 months?: Office Visit: 02/24/22 Perrin Maltese MD. For Hypothyroidism. No medication changes.  Consults: 03/02/22 [facility], [facility] and [place], Starr Sinclair For Rheumatoid arthritis. No medication changes. Has there been any documented recent hospitalizations or ED visits since last visit with Clinical Lead?: No Adherence Review [facility] to medication refill data?: Yes Adherence rates for STAR metric medications: Losartan 25 mg - 03/14/22 90 DS Rosuvastatin 40 mg - 01/18/22 90 DS Adherence rates for medications indicated for disease state being reviewed: Losartan 25 mg - 03/14/22 90 DS Does the patient have >5 day gap between last estimated fill dates for any of the above medications?: No Disease State Questions Able to connect with the Patient?: Yes Is the patient monitoring his/her BP?: Yes How often are you checking your BP?: occasionally Home BP Reading #1 (most recent): 118/88 Home BP Reading #2: 134/82 Home BP Reading #3: 120/60 Is the patient having any low BP Readings <90/60?: No Is the patient having any BP readings above >180/100?: No Is the patient's average BP>140/90?: No What is your blood pressure goal?: 120/80 Educate patient to inform proper points on checking BP at home:: Sit with feet flat on the floor, arm at heart level., Do not drink caffeine or smoke a cigarette at least 30 min. prior to checking. What diet changes have you made to improve your Blood Pressure Control?: eating more home-cooked meals What  exercise are you doing to improve your Blood Pressure Control?: walking Misc. Response/Information:: Patient stated she has a sinus infection and has not tried anything otc. I advised to call the office and make appointment. Clinical Lead Review Review Adherence gaps identified?: No Drug Therapy Problems identified?: No Assessment: Controlled Engagement Notes McLemore, Veronica on 03/29/2022 11:45 AM HC Chart Review: 10 min 03/29/22 HC Assessment call time spent: 10 min 03/30/22 Jerral Ralph on 03/30/2022 08:10 PM 8.06pm-8.09pm 74mns

## 2022-04-06 DIAGNOSIS — M0579 Rheumatoid arthritis with rheumatoid factor of multiple sites without organ or systems involvement: Secondary | ICD-10-CM | POA: Diagnosis not present

## 2022-04-07 IMAGING — MG DIGITAL SCREENING BILAT W/ TOMO W/ CAD
6 of 10 series · 6 of 30 positions shown · non-contrast
Comparison: Previous exam(s).

ACR Breast Density Category a: The breast tissue is almost entirely
fatty.

CLINICAL DATA: Screening.

EXAM:
DIGITAL SCREENING BILATERAL MAMMOGRAM WITH TOMO AND CAD

[L MLO synth-2D]
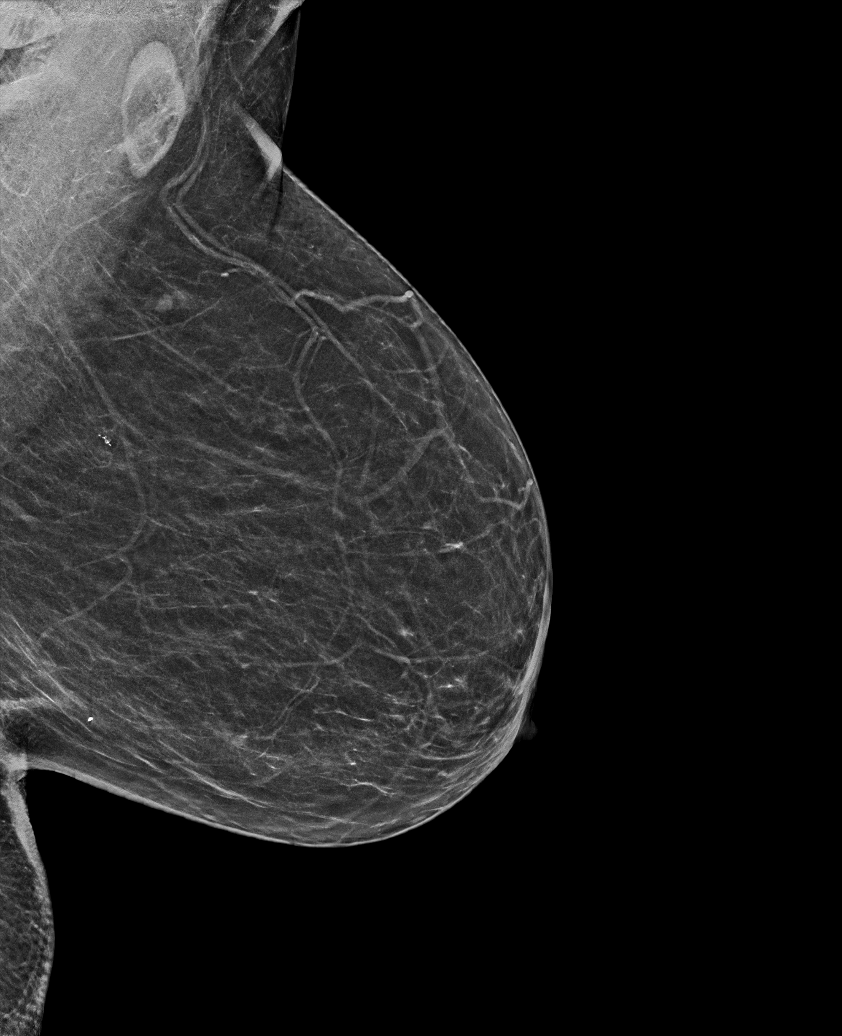

[R CC synth-2D (1 of 2)]
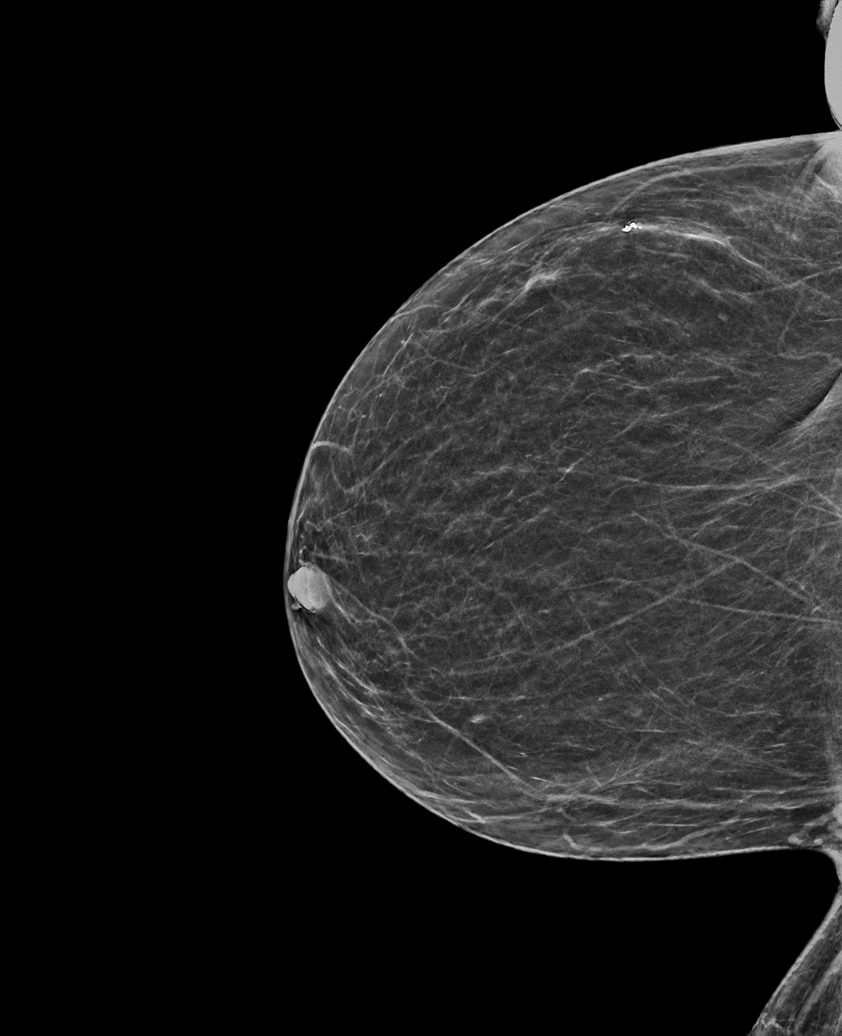

[R CC synth-2D (2 of 2)]
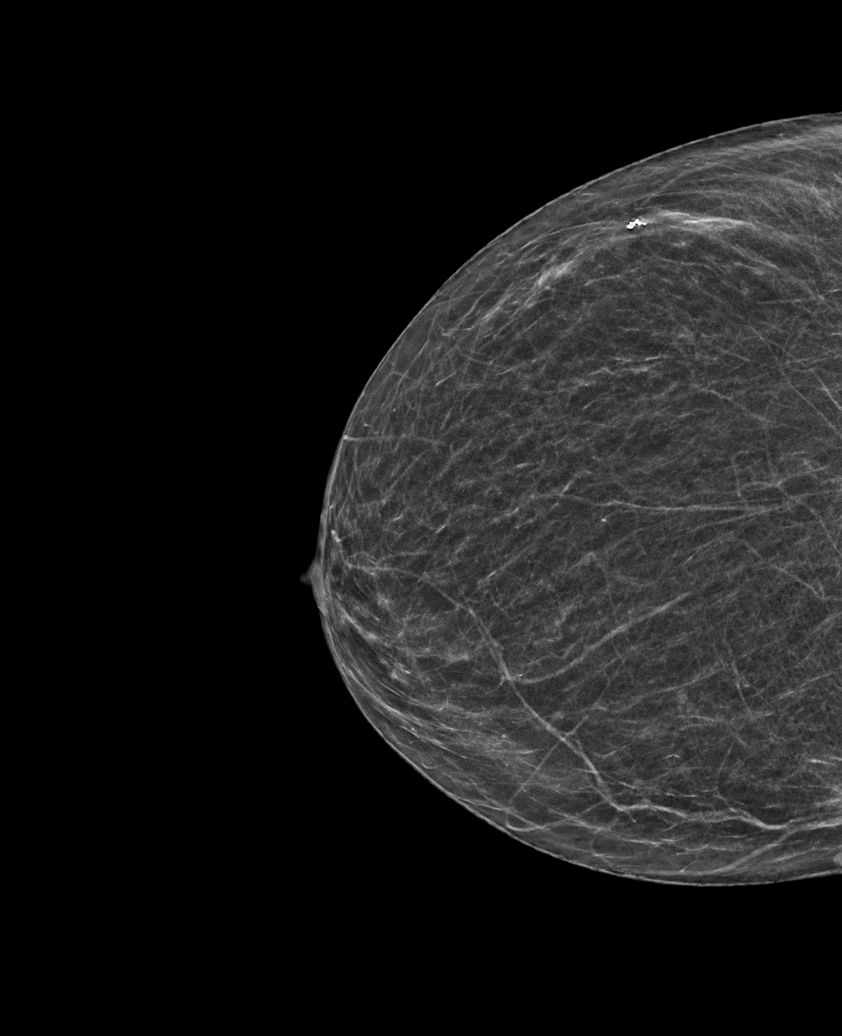

[L CC synth-2D]
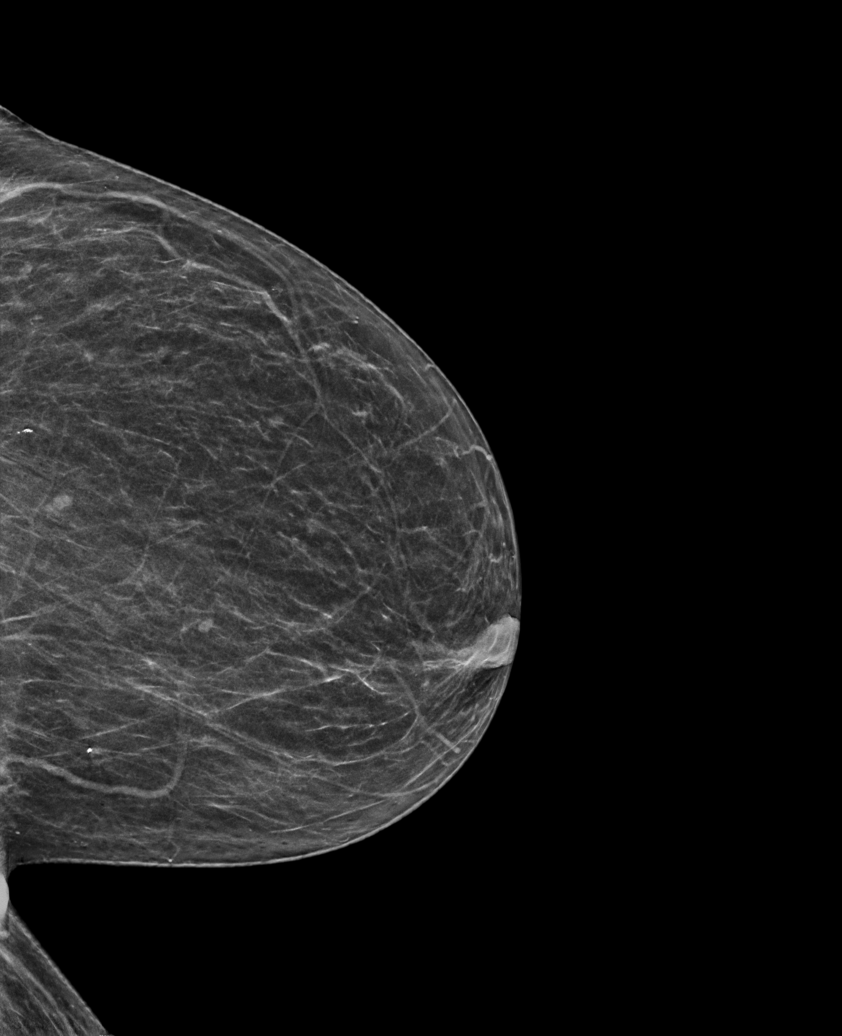

[R MLO synth-2D]
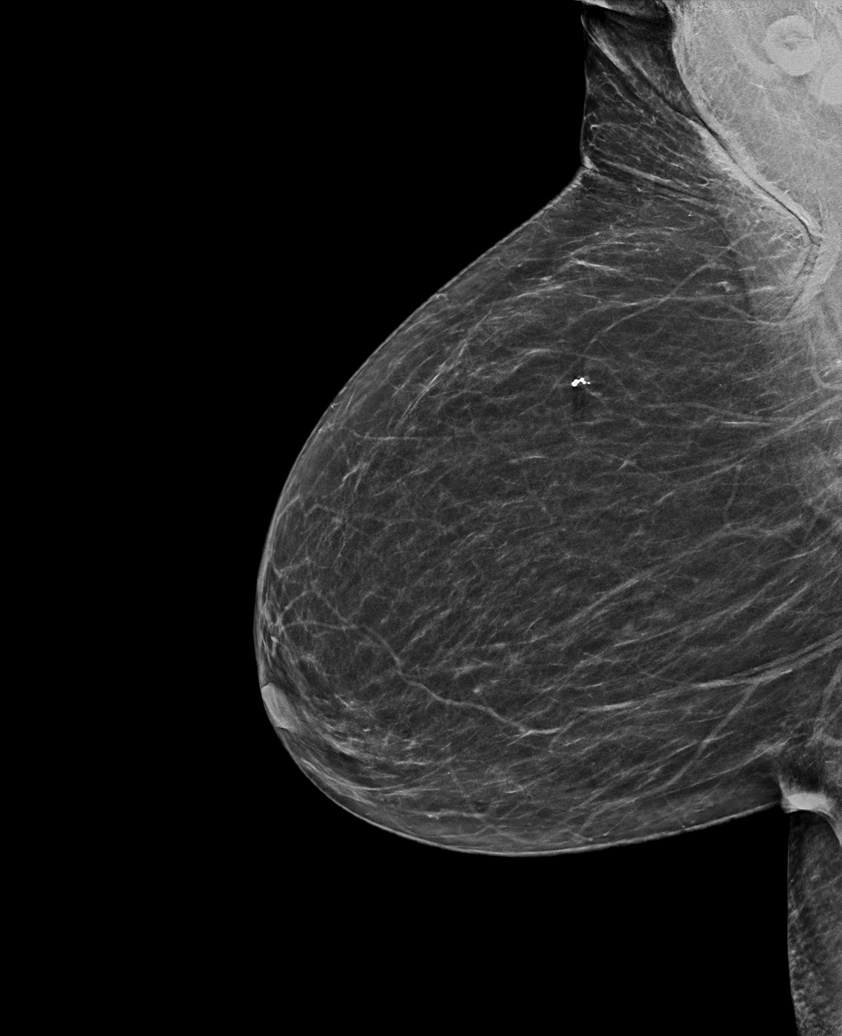

[L MLO tomo · tomo slice 29/58.0]
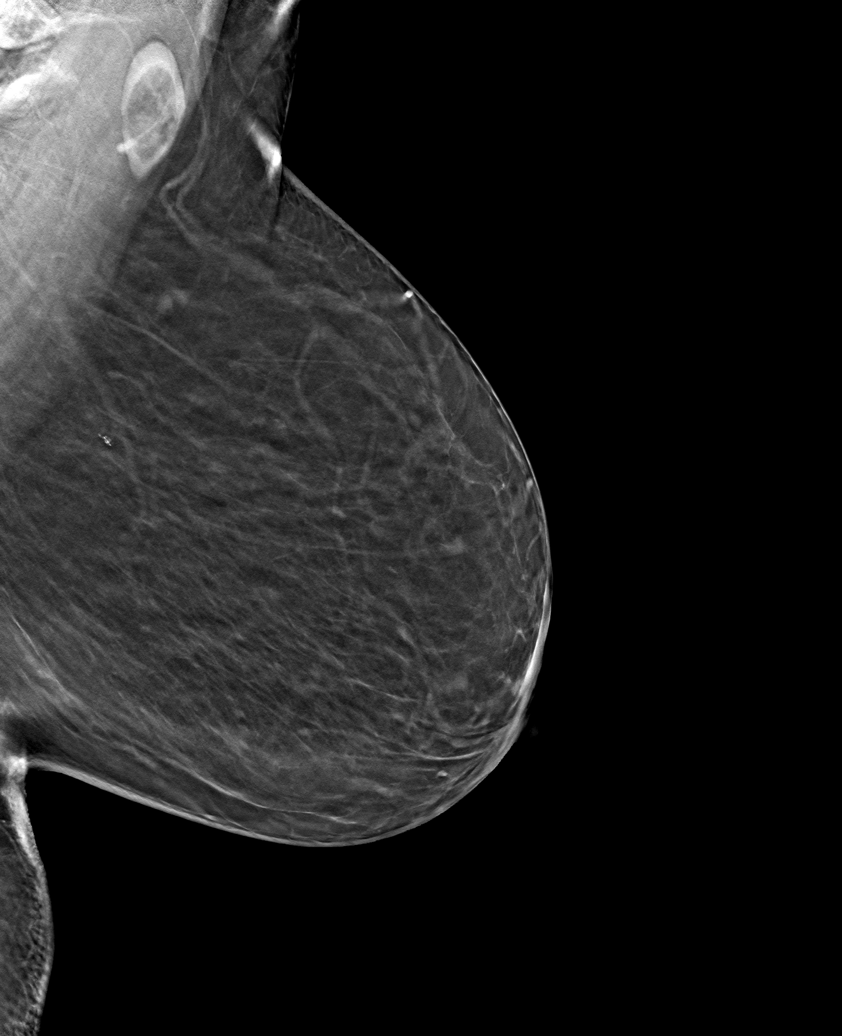

[6 of 30 positions shown; findings below may reference images not displayed]

FINDINGS: There are no findings suspicious for malignancy. Images were
processed with CAD.
IMPRESSION: No mammographic evidence of malignancy. A result letter of this
screening mammogram will be mailed directly to the patient.

RECOMMENDATION:
Screening mammogram in one year. (Code:8Y-Q-VVS)

BI-RADS CATEGORY  1: Negative.

## 2022-04-24 DIAGNOSIS — I2510 Atherosclerotic heart disease of native coronary artery without angina pectoris: Secondary | ICD-10-CM | POA: Diagnosis not present

## 2022-04-24 DIAGNOSIS — E039 Hypothyroidism, unspecified: Secondary | ICD-10-CM | POA: Diagnosis not present

## 2022-04-24 DIAGNOSIS — I10 Essential (primary) hypertension: Secondary | ICD-10-CM | POA: Diagnosis not present

## 2022-04-29 ENCOUNTER — Other Ambulatory Visit: Payer: Self-pay | Admitting: Cardiovascular Disease

## 2022-04-29 DIAGNOSIS — I2510 Atherosclerotic heart disease of native coronary artery without angina pectoris: Secondary | ICD-10-CM

## 2022-05-02 ENCOUNTER — Other Ambulatory Visit: Payer: Self-pay | Admitting: Internal Medicine

## 2022-05-04 ENCOUNTER — Inpatient Hospital Stay: Payer: Medicare Other | Attending: Obstetrics and Gynecology | Admitting: Obstetrics and Gynecology

## 2022-05-04 ENCOUNTER — Encounter: Payer: Self-pay | Admitting: Obstetrics and Gynecology

## 2022-05-04 VITALS — BP 112/75 | HR 68 | Resp 18 | Ht 62.5 in | Wt 179.0 lb

## 2022-05-04 DIAGNOSIS — Z8542 Personal history of malignant neoplasm of other parts of uterus: Secondary | ICD-10-CM

## 2022-05-04 DIAGNOSIS — Z08 Encounter for follow-up examination after completed treatment for malignant neoplasm: Secondary | ICD-10-CM | POA: Diagnosis not present

## 2022-05-04 DIAGNOSIS — Z90722 Acquired absence of ovaries, bilateral: Secondary | ICD-10-CM | POA: Diagnosis not present

## 2022-05-04 DIAGNOSIS — M0579 Rheumatoid arthritis with rheumatoid factor of multiple sites without organ or systems involvement: Secondary | ICD-10-CM | POA: Diagnosis not present

## 2022-05-04 DIAGNOSIS — Z90710 Acquired absence of both cervix and uterus: Secondary | ICD-10-CM | POA: Diagnosis not present

## 2022-05-04 DIAGNOSIS — K625 Hemorrhage of anus and rectum: Secondary | ICD-10-CM | POA: Diagnosis not present

## 2022-05-04 NOTE — Progress Notes (Signed)
Gynecologic Oncology Consult Visit   Referring Provider: Dr. 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 G3P3 female with h/o STEMI s/p stent placement who is seen in consultation from Dr. Bjorn PippinSchumann for high grade endometrial cancer s/p EUA, TLH-BSO with SLN mapping and dissection with Dr. Oneita [facility]avrilesky at [facility] on 06/19/2020. She opted for active surveillance and returns for follow up.   She continues to feel well and denies vaginal discharge or bleeding. No pelvic pain or changes in stool. Follows with Rheumatology for RA.   Last imaging was 11/30/20- CT Abdomen pelvis Wo contrast.   Gynecologic Oncology History:  Kimberly Burke is a pleasant G3P3 female with h/o STEMI s/p stent placement who is seen in consultation from Dr. Bjorn PippinSchumann for high grade endometrial cancer s/p EUA, TLH-BSO with SLN mapping and dissection for stage Ia disease.  She presented initially with postmenopausal bleeding and was seen by Dr. Bjorn PippinSchumann for evaluation. As part of her evaluation with her primary care office a CT was obtained of the abdomen and pelvis. CT 05/22/2020 showed several findings:  There was a simple appearing cyst on the left lobe of the liver with simple with a maximum dimension of 4.3 cm.  This was not substantially changed from her prior exam.  There were multiple simple appearing right renal cyst primary in the mid and lower lobe the largest arising from the lower lobe measuring 3.8 cm.  2 other cyst in the right kidney were 2.5 to 3 cm in size.  There is no evidence of adrenal or renal mass or renal obstruction on the study obtained without IV contrast. Prominent appearance of the right ovary which contains a focus of calcification.  The right ovary measures approximately 4 cm. The endometrial stripe appears abnormally thickened for a postmenopausal patient measuring 1.7 cm. There is no fluid or free fluid noted in the pelvis.       05/27/2020  EMBx A. 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  Uterus sounded to 8 cm  06/19/20 Underwent total laparoscopic hysterectomy, bilateral salpingoophorectomy, bilateral sentinel lymph node biopsies, pelvic washings. 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  Mismatch repair: INTACT A. Immunohistochemistry: Normal result. Expression of MLH1, MSH2, MSH6, and PMS2 is retained. B. PCR: Microsatellite stable (MSS).     % of Cells Staining Intensity Score Interpretation  ER IHC 31 1+ POSITIVE  PR IHC <1 2+ NEGATIVE  HER2/neu IHC 40 1+ NEGATIVE    Cytology Scant cytologically bland cells present, suggestive of endometrial cells.  Comment:  There is no evidence of malignancy.  A single group of bland cells resembling endometrial epithelium is present.  The differential diagnosis includes endometriosis versus refluxed benign endometrium.  Clinical correlation with the corresponding surgical pathology specimen ([id number]) is recommended.    Recommendations [facility] Board at [facility] active surveillance; [facility] Tumor Board recommend VBT. Can discuss systemic chemotherapy.  NCCN guidelines reviewed. For stage 1A noninvasive options include VBT or observation if negative washings. Her washings did not definitively demonstrate malignancy. We reviewed recommendations from [facility] and [facility] Tumor Board as well as NCCN guidelines. We reviewed pros and risks of additional therapy. Risk of recurrence ranges from 10-22%.  With radiation the recurrence rate would be reduced to ~5-7%. She would like to proceed with active surveillance at this time. She will contact me if she changes her mind and if so we will refer to Dr.  Rushie Chestnut.   Problem List: Patient Active Problem List   Diagnosis Date Noted   Rotator cuff tear arthropathy of right shoulder 02/24/2022   Encounter for follow-up surveillance of endometrial cancer 01/12/2022   Coronary artery disease involving native coronary artery of native heart 06/15/2020   Preoperative evaluation of a medical condition to rule out surgical contraindications (TAR required) 06/15/2020   Chronic kidney disease 06/03/2020   Venous stasis 06/03/2020   Endometrial cancer 06/03/2020   NSTEMI (non-ST elevated myocardial infarction) 12/03/2018   Hypothyroid 12/03/2018   Essential hypertension 12/03/2018   Rheumatoid arthritis 12/03/2018   Degenerative arthritis 08/14/2013    Past Medical History: Past Medical History:  Diagnosis Date   Arthritis    Endometrial cancer (HCC)    Hypertension    Hypothyroid    NSTEMI (non-ST elevated myocardial infarction) (HCC)    Rheumatoid arthritis (HCC)    Rotator cuff tear arthropathy of right shoulder 02/24/2022   Thyroid disease     Past Surgical History: Past Surgical History:  Procedure Laterality Date   ABDOMINAL HYSTERECTOMY  06/19/2020   EUA, TLH-BSO with SLN mapping and dissection with Dr. Oneita Jolly [facility]    CORONARY STENT INTERVENTION N/A 12/04/2018   Procedure: CORONARY STENT INTERVENTION;  Surgeon: Alwyn Pea, MD;  Location: [facility] INVASIVE CV LAB;  Service: Cardiovascular;  Laterality: N/A;   LEFT HEART CATH AND CORONARY ANGIOGRAPHY N/A 12/04/2018   Procedure: LEFT HEART CATH AND CORONARY ANGIOGRAPHY;  Surgeon: Laurier Nancy, MD;  Location: [facility] INVASIVE CV LAB;  Service: Cardiovascular;  Laterality: N/A;    Past Gynecologic History:  Menarche: 13 Menopause; 41 Last Pap unknown; no abnormal Pap   OB History: SVD x 3 OB History  Gravida Para Term Preterm AB Living  3 3          SAB IAB Ectopic Multiple Live Births               # Outcome Date GA Lbr Len/2nd Weight Sex Delivery Anes PTL Lv   3 Para           2 Para           1 Para             Family History: Family History  Problem Relation Age of Onset   Cancer Brother        prostate or colon unsure which    Breast cancer Neg Hx     Social History: Social History   Socioeconomic History   Marital status: Widowed    Spouse name: Not on file   Number of children: Not on file   Years of education: Not on file   Highest education level: Not on file  Occupational History   Not on file  Tobacco Use   Smoking status: Former   Smokeless tobacco: Never  Vaping Use   Vaping Use: Never used  Substance and Sexual Activity   Alcohol use: Never   Drug use: Never   Sexual activity: Not Currently  Other Topics Concern   Not on file  Social History Narrative   Lives with daughter   Social Determinants of Health   Financial Resource Strain: Low Risk  (09/02/2021)   Overall Financial Resource Strain (CARDIA)  Difficulty of Paying Living Expenses: Not hard at all  Food Insecurity: No Food Insecurity (09/09/2021)   Hunger Vital Sign    Worried About Running Out of Food in the Last Year: Never true    Ran Out of Food in the Last Year: Never true  Transportation Needs: No Transportation Needs (09/09/2021)   PRAPARE - Administrator, Civil Service (Medical): No    Lack of Transportation (Non-Medical): No  Physical Activity: Insufficiently Active (09/02/2021)   Exercise Vital Sign    Days of Exercise per Week: 5 days    Minutes of Exercise per Session: 10 min  Stress: No Stress Concern Present (09/02/2021)   [employer] of Occupational Health - Occupational Stress Questionnaire    Feeling of Stress : Not at all  Social Connections: Moderately Integrated (09/02/2021)   Social Connection and Isolation Panel [NHANES]    Frequency of Communication with Friends and Family: More than three times a week    Frequency of Social Gatherings with Friends and Family: More than three times a week    Attends  Religious Services: More than 4 times per year    Active Member of [employer] or Organizations: Yes    Attends Banker Meetings: More than 4 times per year    Marital Status: Widowed  Intimate Partner Violence: Not on file    Allergies: Allergies  Allergen Reactions   Codeine Itching   Penicillins Other (See Comments)    unknown    Current Medications: Current Outpatient Medications  Medication Sig Dispense Refill   acetaminophen (TYLENOL) 650 MG CR tablet Take 1,300 mg by mouth daily.  (Patient not taking: Reported on 02/24/2022)     aspirin 81 MG chewable tablet Chew 1 tablet (81 mg total) by mouth daily.     BRILINTA 60 MG TABS tablet Take 1 tablet (60 mg total) by mouth 2 (two) times daily. 60 tablet 3   celecoxib (CELEBREX) 200 MG capsule Take 200 mg by mouth daily. (Patient not taking: Reported on 02/24/2022)     Cholecalciferol 1.25 MG (50000 UT) capsule Take by mouth. (Patient not taking: Reported on 02/24/2022)     DULoxetine (CYMBALTA) 30 MG capsule Take 30 mg by mouth daily.     fluticasone (FLONASE) 50 MCG/ACT nasal spray Place 1 spray into both nostrils daily. (Patient not taking: Reported on 02/24/2022)     folic acid (FOLVITE) 1 MG tablet TAKE 1 TABLET BY MOUTH EVERY DAY 90 tablet 3   gabapentin (NEURONTIN) 100 MG capsule Take 100 mg by mouth 3 (three) times daily.     hydrochlorothiazide (HYDRODIURIL) 25 MG tablet Take 1 tablet (25 mg total) by mouth daily. 90 tablet 3   hydroxychloroquine (PLAQUENIL) 200 MG tablet Take by mouth. (Patient not taking: Reported on 02/24/2022)     levothyroxine (SYNTHROID) 50 MCG tablet TAKE 1 TABLET BY MOUTH EVERY DAY 30 tablet 2   losartan (COZAAR) 25 MG tablet Take by mouth.     methotrexate (RHEUMATREX) 2.5 MG tablet Take 15 mg by mouth once a week. (Patient not taking: Reported on 02/24/2022)     metoprolol succinate (TOPROL-XL) 100 MG 24 hr tablet TAKE 1 TABLET BY MOUTH EVERY DAY 90 tablet 0   pantoprazole (PROTONIX) 40 MG tablet Take  1 tablet (40 mg total) by mouth daily. 90 tablet 3   rosuvastatin (CRESTOR) 40 MG tablet Take 1 tablet (40 mg total) by mouth daily at 6 PM. 30 tablet 1   traMADol (ULTRAM)  50 MG tablet Take 50 mg by mouth 2 (two) times daily as needed. (Patient not taking: Reported on 02/24/2022)     No current facility-administered medications for this visit.   Review of Systems General:  no complaints Skin: no complaints Eyes: no complaints HEENT: no complaints Breasts: no complaints Pulmonary: no complaints Cardiac: no complaints Gastrointestinal: rectal bleeding x 1 episode of BRB due to hemorrhoid now resolved Genitourinary/Sexual: no complaints Ob/Gyn: no complaints Musculoskeletal: back pain- new Hematology: no complaints Neurologic/Psych: no complaints   Objective:  Physical Examination:  BP 112/75 (BP Location: Left Arm, Patient Position: Sitting)   Pulse 68   Resp 18   Ht 5' 2.5" (1.588 m)   Wt 179 lb (81.2 kg)   BMI 32.22 kg/m    ECOG Performance Status: 1 - Symptomatic but completely ambulatory  GENERAL: Patient is a well appearing female in no acute distress HEENT:  Sclera clear. Anicteric NODES:  No axillary, inguinal, or supraclavicular adenopathy LUNGS:  Normal respiratory rate   ABDOMEN:  Soft, nontender, nondistended.  Upper abdominal wall laxity. No masses or ascites.  EXTREMITIES:  No peripheral edema. Atraumatic. No cyanosis SKIN:  Clear with no obvious rashes or skin changes.  NEURO:  Nonfocal. Well oriented.  Appropriate affect.  Pelvic: Exam chaperoned by NP EGBUS: no lesions Cervix: surgically absent Vagina: no lesions, no discharge or bleeding. Vaginal cuff well healed  Uterus: surgically absent BME: no palpable masses Rectal: deferred  Lab Review N/A  Radiologic Imaging: As per HPI  Assessment:  DEMIA RINGE is a 82 y.o. female diagnosed with high grade endometrial cancer (pMMR, MSS, ER 1+; PR negative; Her2neu negative), stage IA noninvasive  endometrial cancer with washings that did not definitively demonstrate malignancy 06/19/20. Discussed VBT vs surveillance post op and patient elected for surveillance. Clinically, asymptomatic. NED on exam. Interval CT scan 11/22 was negative for radiographic evidence of disease.  NED on exam today.  Possible abdominal hernia versus laxity- imaging in November 2022 revealed small fat containing right lateral abdominal wall hernia, asymptomatic  Back pain, suspect due to arthritis   Right ovarian cyst with calcification  Hepatic and renal cysts  Recent bone density scan 7/23 shows osteopenia.  Follows with Rheumatology for RA.   Medical co-morbidities complicating care: HTN, h/o STEMI 2020 s/p stent placement (Dr. Welton Flakes is her cardiologist), RA (Dr. Lavenia Atlas), hypothyroidism.  Plan:   Problem List Items Addressed This Visit       Other   Encounter for follow-up surveillance of endometrial cancer - Primary   Previously reviewed surveillance guidelines. Recommend continued surveillance in 4 months.    Patient may be interested in sexual activity and we reassured her that her anatomy was normal, aside from the absence of the uterus and cervix. We gave her some samples of vaginal moisturizer/lubricant that may be helpful.  The patient's diagnosis, an outline of the further diagnostic and laboratory studies which will be required, the recommendation, and alternatives were discussed.  All questions were answered to the patient's satisfaction.  Consuello Masse, DNP, AGNP-C, AOCNP [facility] [phone] (clinic)  I personally interviewed and examined the patient. Agreed with the above/below plan of care. I have directly contributed to assessment and plan of care of this patient and educated and discussed with patient and family.  Leida Lauth, MD

## 2022-05-11 ENCOUNTER — Ambulatory Visit: Payer: Medicare Other

## 2022-05-16 ENCOUNTER — Ambulatory Visit: Payer: Medicare Other | Admitting: Cardiovascular Disease

## 2022-05-26 ENCOUNTER — Encounter: Payer: Medicare Other | Admitting: Internal Medicine

## 2022-05-26 ENCOUNTER — Ambulatory Visit: Payer: Medicare Other | Admitting: Internal Medicine

## 2022-06-03 ENCOUNTER — Ambulatory Visit (INDEPENDENT_AMBULATORY_CARE_PROVIDER_SITE_OTHER): Payer: Medicare Other | Admitting: Internal Medicine

## 2022-06-03 ENCOUNTER — Encounter: Payer: Self-pay | Admitting: Internal Medicine

## 2022-06-03 VITALS — BP 118/64 | HR 72 | Ht 62.5 in | Wt 184.4 lb

## 2022-06-03 DIAGNOSIS — Z0000 Encounter for general adult medical examination without abnormal findings: Secondary | ICD-10-CM

## 2022-06-03 DIAGNOSIS — Z1231 Encounter for screening mammogram for malignant neoplasm of breast: Secondary | ICD-10-CM

## 2022-06-03 DIAGNOSIS — J301 Allergic rhinitis due to pollen: Secondary | ICD-10-CM | POA: Diagnosis not present

## 2022-06-03 DIAGNOSIS — C541 Malignant neoplasm of endometrium: Secondary | ICD-10-CM

## 2022-06-03 DIAGNOSIS — E038 Other specified hypothyroidism: Secondary | ICD-10-CM

## 2022-06-03 DIAGNOSIS — M0579 Rheumatoid arthritis with rheumatoid factor of multiple sites without organ or systems involvement: Secondary | ICD-10-CM | POA: Diagnosis not present

## 2022-06-03 DIAGNOSIS — I10 Essential (primary) hypertension: Secondary | ICD-10-CM

## 2022-06-03 DIAGNOSIS — M810 Age-related osteoporosis without current pathological fracture: Secondary | ICD-10-CM | POA: Insufficient documentation

## 2022-06-03 DIAGNOSIS — Z13820 Encounter for screening for osteoporosis: Secondary | ICD-10-CM

## 2022-06-03 DIAGNOSIS — E782 Mixed hyperlipidemia: Secondary | ICD-10-CM

## 2022-06-03 MED ORDER — LORATADINE 10 MG PO TABS: 10.0000 mg | ORAL_TABLET | Freq: Every day | ORAL | 3 refills | Status: DC

## 2022-06-03 MED ORDER — FLUTICASONE PROPIONATE 50 MCG/ACT NA SUSP: 1.0000 | Freq: Every day | NASAL | 6 refills | Status: DC

## 2022-06-03 NOTE — Progress Notes (Signed)
Established Patient Office Visit  Subjective:  Patient ID: Kimberly Burke, female    DOB: [birth year]  Age: 82 y.o. MRN: [id number]  Chief Complaint  Patient presents with   Annual Exam    AWV/3 month follow up    Patient comes in for follow-up and her annual wellness visit.  She is generally feeling well.  Her right shoulder pain has resolved somehow even though she has not seen an orthopedic yet for her rotator tendon cuff tear.  She is under care of rheumatologist for her RA and gets regular infusions for treatment.  Today she is pain-free and is in good spirits. She is taking care of her sister in [place] and mentions that she gets stressed at times.  Her PHQ-GAD score is actually 0/0 because she is currently taking Cymbalta 30 mg/day and it is working. Her CFS score is 2. She needs to schedule a mammogram and BMD. She also needs to see her GYN oncology for a follow-up of her endometrial cancer. She occasionally gets positional vertigo of right ear.    No other concerns at this time.   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 Oneita Jolly at [facility]    CORONARY STENT INTERVENTION N/A 12/04/2018   Procedure: CORONARY STENT INTERVENTION;  Surgeon: Alwyn Pea, MD;  Location: [facility] INVASIVE CV LAB;  Service: Cardiovascular;  Laterality: N/A;   LEFT HEART CATH AND CORONARY ANGIOGRAPHY N/A 12/04/2018   Procedure: LEFT HEART CATH AND CORONARY ANGIOGRAPHY;  Surgeon: Laurier Nancy, MD;  Location: [facility] INVASIVE CV LAB;  Service: Cardiovascular;  Laterality: N/A;    Social History    Socioeconomic History   Marital status: Widowed    Spouse name: Not on file   Number of children: Not on file   Years of education: Not on file   Highest education level: Not on file  Occupational History   Not on file  Tobacco Use   Smoking status: Former   Smokeless tobacco: Never  Vaping Use   Vaping Use: Never used  Substance and Sexual Activity   Alcohol use: Never   Drug use: Never   Sexual activity: Not Currently  Other Topics Concern   Not on file  Social History Narrative   Lives with daughter   Social Determinants of Health   Financial Resource Strain: Low Risk  (09/02/2021)   Overall Financial Resource Strain (CARDIA)    Difficulty of Paying Living Expenses: Not hard at all  Food Insecurity: No Food Insecurity (09/09/2021)   Hunger Vital Sign    Worried About Running Out of Food in the Last Year: Never true    Ran Out of Food in the Last Year: Never true  Transportation Needs: No Transportation Needs (09/09/2021)   PRAPARE - Administrator, Civil Service (Medical): No    Lack of Transportation (Non-Medical): No  Physical Activity: Insufficiently Active (09/02/2021)   Exercise Vital Sign    Days of Exercise per Week: 5 days    Minutes of Exercise per Session: 10 min  Stress: No Stress  Concern Present (09/02/2021)   [employer] of Occupational Health - Occupational Stress Questionnaire    Feeling of Stress : Not at all  Social Connections: Moderately Integrated (09/02/2021)   Social Connection and Isolation Panel [NHANES]    Frequency of Communication with Friends and Family: More than three times a week    Frequency of Social Gatherings with Friends and Family: More than three times a week    Attends Religious Services: More than 4 times per year    Active Member of [employer] or Organizations: Yes    Attends Banker Meetings: More than 4 times per year    Marital Status: Widowed  Catering manager Violence: Not on file    Family  History  Problem Relation Age of Onset   Cancer Brother        prostate or colon unsure which    Breast cancer Neg Hx     Allergies  Allergen Reactions   Codeine Itching   Penicillins Other (See Comments)    unknown    Review of Systems  Constitutional: Negative.  Negative for chills, diaphoresis, fever, malaise/fatigue and weight loss.  HENT: Negative.    Eyes: Negative.  Negative for blurred vision, double vision, discharge and redness.  Respiratory:  Negative for cough, shortness of breath and wheezing.   Cardiovascular:  Positive for orthopnea. Negative for chest pain, palpitations, leg swelling and PND.  Gastrointestinal: Negative.  Negative for abdominal pain, blood in stool, constipation, diarrhea, heartburn, melena, nausea and vomiting.  Genitourinary: Negative.  Negative for dysuria, frequency, hematuria and urgency.  Musculoskeletal: Negative.   Neurological:  Positive for dizziness. Negative for tingling, tremors, sensory change, speech change, focal weakness, seizures, weakness and headaches.  Psychiatric/Behavioral:  Negative for depression. The patient is nervous/anxious. The patient does not have insomnia.        Objective:   BP 118/64   Pulse 72   Ht 5' 2.5" (1.588 m)   Wt 184 lb 6.4 oz (83.6 kg)   SpO2 93%   BMI 33.19 kg/m   Vitals:   06/03/22 1352  BP: 118/64  Pulse: 72  Height: 5' 2.5" (1.588 m)  Weight: 184 lb 6.4 oz (83.6 kg)  SpO2: 93%  BMI (Calculated): 33.17    Physical Exam Vitals and nursing note reviewed.  Constitutional:      General: She is not in acute distress.    Appearance: Normal appearance. She is normal weight.  HENT:     Head: Normocephalic.     Right Ear: Tympanic membrane and ear canal normal. There is no impacted cerumen.     Left Ear: Ear canal normal. There is no impacted cerumen.     Nose: Nose normal.     Mouth/Throat:     Mouth: Mucous membranes are moist.     Pharynx: Oropharynx is clear. No oropharyngeal  exudate or posterior oropharyngeal erythema.  Cardiovascular:     Rate and Rhythm: Normal rate and regular rhythm.     Pulses: Normal pulses.     Heart sounds: Normal heart sounds. No murmur heard. Pulmonary:     Effort: Pulmonary effort is normal.     Breath sounds: Normal breath sounds. No wheezing, rhonchi or rales.  Chest:     Chest wall: No tenderness.  Abdominal:     General: There is no distension.     Palpations: There is no mass.     Tenderness: There is no abdominal tenderness. There is no guarding.  Hernia: No hernia is present.  Musculoskeletal:        General: No swelling or tenderness. Normal range of motion.     Cervical back: Normal range of motion and neck supple. No rigidity.     Right lower leg: No edema.     Left lower leg: No edema.  Skin:    General: Skin is warm.     Coloration: Skin is not jaundiced or pale.  Neurological:     General: No focal deficit present.     Mental Status: She is alert and oriented to person, place, and time.  Psychiatric:        Mood and Affect: Mood normal.        Behavior: Behavior normal.      No results found for any visits on 06/03/22.  No results found for this or any previous visit (from the past 2160 hour(s)).    Assessment & Plan:  Patient will get her blood work done today.  Schedule her mammogram and bone density.  Patient will also see her GYN oncology for a follow-up, once her sister starts to recover from her surgery. 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   Total time spent: 30 minutes  Margaretann Loveless, MD  06/03/2022

## 2022-06-04 LAB — CMP14+EGFR
ALT: 23 IU/L (ref 0–32)
AST: 24 IU/L (ref 0–40)
Albumin/Globulin Ratio: 1.4 (ref 1.2–2.2)
Albumin: 4.2 g/dL (ref 3.8–4.8)
Alkaline Phosphatase: 99 IU/L (ref 44–121)
BUN/Creatinine Ratio: 20 (ref 12–28)
BUN: 22 mg/dL (ref 8–27)
Bilirubin Total: 0.6 mg/dL (ref 0.0–1.2)
CO2: 26 mmol/L (ref 20–29)
Calcium: 10.6 mg/dL — ABNORMAL HIGH (ref 8.7–10.3)
Chloride: 103 mmol/L (ref 96–106)
Creatinine, Ser: 1.12 mg/dL — ABNORMAL HIGH (ref 0.57–1.00)
Globulin, Total: 3.1 g/dL (ref 1.5–4.5)
Glucose: 99 mg/dL (ref 70–99)
Potassium: 3.8 mmol/L (ref 3.5–5.2)
Sodium: 143 mmol/L (ref 134–144)
Total Protein: 7.3 g/dL (ref 6.0–8.5)
eGFR: 50 mL/min/{1.73_m2} — ABNORMAL LOW (ref >59)

## 2022-06-04 LAB — CBC WITH DIFFERENTIAL
Basophils Absolute: 0.1 10*3/uL (ref 0.0–0.2)
Basos: 1 %
EOS (ABSOLUTE): 0.3 10*3/uL (ref 0.0–0.4)
Eos: 4 %
Hematocrit: 35.3 % (ref 34.0–46.6)
Hemoglobin: 12 g/dL (ref 11.1–15.9)
Immature Grans (Abs): 0 10*3/uL (ref 0.0–0.1)
Immature Granulocytes: 0 %
Lymphocytes Absolute: 1.7 10*3/uL (ref 0.7–3.1)
Lymphs: 25 %
MCH: 31.1 pg (ref 26.6–33.0)
MCHC: 34 g/dL (ref 31.5–35.7)
MCV: 92 fL (ref 79–97)
Monocytes Absolute: 0.9 10*3/uL (ref 0.1–0.9)
Monocytes: 14 %
Neutrophils Absolute: 3.8 10*3/uL (ref 1.4–7.0)
Neutrophils: 56 %
RBC: 3.86 x10E6/uL (ref 3.77–5.28)
RDW: 14.1 % (ref 11.7–15.4)
WBC: 6.9 10*3/uL (ref 3.4–10.8)

## 2022-06-04 LAB — LIPID PANEL W/O CHOL/HDL RATIO
Cholesterol, Total: 77 mg/dL — ABNORMAL LOW (ref 100–199)
HDL: 34 mg/dL — ABNORMAL LOW (ref >39)
LDL Chol Calc (NIH): 27 mg/dL (ref 0–99)
Triglycerides: 70 mg/dL (ref 0–149)
VLDL Cholesterol Cal: 16 mg/dL (ref 5–40)

## 2022-06-04 LAB — TSH: TSH: 2.05 u[IU]/mL (ref 0.450–4.500)

## 2022-06-09 ENCOUNTER — Other Ambulatory Visit: Payer: Self-pay | Admitting: Internal Medicine

## 2022-06-09 DIAGNOSIS — I10 Essential (primary) hypertension: Secondary | ICD-10-CM

## 2022-06-16 DIAGNOSIS — M0579 Rheumatoid arthritis with rheumatoid factor of multiple sites without organ or systems involvement: Secondary | ICD-10-CM | POA: Diagnosis not present

## 2022-06-16 DIAGNOSIS — Z79899 Other long term (current) drug therapy: Secondary | ICD-10-CM | POA: Diagnosis not present

## 2022-06-21 DIAGNOSIS — Z79899 Other long term (current) drug therapy: Secondary | ICD-10-CM | POA: Diagnosis not present

## 2022-06-22 ENCOUNTER — Other Ambulatory Visit: Payer: Self-pay | Admitting: Internal Medicine

## 2022-06-22 DIAGNOSIS — M159 Polyosteoarthritis, unspecified: Secondary | ICD-10-CM

## 2022-07-04 DIAGNOSIS — M0579 Rheumatoid arthritis with rheumatoid factor of multiple sites without organ or systems involvement: Secondary | ICD-10-CM | POA: Diagnosis not present

## 2022-07-12 ENCOUNTER — Other Ambulatory Visit: Payer: Self-pay | Admitting: Family

## 2022-07-14 ENCOUNTER — Telehealth: Payer: Self-pay

## 2022-07-14 NOTE — Patient Outreach (Signed)
  Care Coordination   07/14/2022 Name: BRAVE MUNI MRN: [id number] DOB: [birth year]   Care Coordination Outreach Attempts:  Unable to re-establish contact with patient.   Follow Up Plan:  No further outreach attempts will be made at this time. We have been unable to contact the patient to offer or enroll patient in care coordination services  Encounter Outcome:  No Answer   Care Coordination Interventions:  No, not indicated    George Ina [facility] Care Coordination [phone] direct line

## 2022-08-01 DIAGNOSIS — M069 Rheumatoid arthritis, unspecified: Secondary | ICD-10-CM | POA: Diagnosis not present

## 2022-08-08 ENCOUNTER — Other Ambulatory Visit: Payer: Self-pay | Admitting: Cardiovascular Disease

## 2022-08-21 ENCOUNTER — Other Ambulatory Visit: Payer: Self-pay | Admitting: Cardiovascular Disease

## 2022-08-21 DIAGNOSIS — I2510 Atherosclerotic heart disease of native coronary artery without angina pectoris: Secondary | ICD-10-CM

## 2022-09-02 ENCOUNTER — Ambulatory Visit (INDEPENDENT_AMBULATORY_CARE_PROVIDER_SITE_OTHER): Payer: Medicare Other | Admitting: Internal Medicine

## 2022-09-02 ENCOUNTER — Encounter: Payer: Self-pay | Admitting: Internal Medicine

## 2022-09-02 VITALS — BP 124/82 | HR 65 | Ht 62.0 in | Wt 190.2 lb

## 2022-09-02 DIAGNOSIS — M05731 Rheumatoid arthritis with rheumatoid factor of right wrist without organ or systems involvement: Secondary | ICD-10-CM | POA: Diagnosis not present

## 2022-09-02 DIAGNOSIS — M05732 Rheumatoid arthritis with rheumatoid factor of left wrist without organ or systems involvement: Secondary | ICD-10-CM

## 2022-09-02 DIAGNOSIS — I10 Essential (primary) hypertension: Secondary | ICD-10-CM

## 2022-09-02 DIAGNOSIS — E038 Other specified hypothyroidism: Secondary | ICD-10-CM | POA: Diagnosis not present

## 2022-09-02 DIAGNOSIS — E782 Mixed hyperlipidemia: Secondary | ICD-10-CM

## 2022-09-02 DIAGNOSIS — I214 Non-ST elevation (NSTEMI) myocardial infarction: Secondary | ICD-10-CM

## 2022-09-02 MED ORDER — FOLIC ACID 1 MG PO TABS: 1.0000 mg | ORAL_TABLET | Freq: Every day | ORAL | 3 refills | Status: DC

## 2022-09-02 NOTE — Progress Notes (Signed)
Established Patient Office Visit  Subjective:  Patient ID: Kimberly Burke, female    DOB: 1940-02-06  Age: 82 y.o. MRN: [id number]  Chief Complaint  Patient presents with   Follow-up    3 month follow up    Patient comes in for her follow-up today.  She has missed her mammogram and DEXA appointments as she was out of town taking care of her sister.  Today she is feeling well and has no new complaints.  Will come back fasting for her blood work.  She will be getting her infusion from rheumatologist next week as well.    No other concerns at this time.   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 Oneita Jolly at [facility]    CORONARY STENT INTERVENTION N/A 12/04/2018   Procedure: CORONARY STENT INTERVENTION;  Surgeon: Alwyn Pea, MD;  Location: [facility] INVASIVE CV LAB;  Service: Cardiovascular;  Laterality: N/A;   LEFT HEART CATH AND CORONARY ANGIOGRAPHY N/A 12/04/2018   Procedure: LEFT HEART CATH AND CORONARY ANGIOGRAPHY;  Surgeon: Laurier Nancy, MD;  Location: [facility] INVASIVE CV LAB;  Service: Cardiovascular;  Laterality: N/A;    Social History   Socioeconomic History   Marital status: Widowed    Spouse name: Not on file   Number of children: Not on file   Years of education: Not on file   Highest education level: Not on file  Occupational History   Not on file  Tobacco Use   Smoking status: Former   Smokeless tobacco: Never  Vaping Use   Vaping status: Never Used  Substance and Sexual Activity   Alcohol use: Never   Drug use: Never   Sexual activity: Not Currently  Other Topics Concern    Not on file  Social History Narrative   Lives with daughter   Social Determinants of Health   Financial Resource Strain: Low Risk  (09/02/2021)   Overall Financial Resource Strain (CARDIA)    Difficulty of Paying Living Expenses: Not hard at all  Food Insecurity: No Food Insecurity (09/09/2021)   Hunger Vital Sign    Worried About Running Out of Food in the Last Year: Never true    Ran Out of Food in the Last Year: Never true  Transportation Needs: No Transportation Needs (09/09/2021)   PRAPARE - Administrator, Civil Service (Medical): No    Lack of Transportation (Non-Medical): No  Physical Activity: Insufficiently Active (09/02/2021)   Exercise Vital Sign    Days of Exercise per Week: 5 days    Minutes of Exercise per Session: 10 min  Stress: No Stress Concern Present (09/02/2021)   [employer] of Occupational Health - Occupational Stress Questionnaire    Feeling of Stress : Not at all  Social Connections: Moderately Integrated (09/02/2021)   Social Connection and Isolation Panel [NHANES]    Frequency of Communication with Friends and Family: More than three times a week    Frequency of Social Gatherings with Friends and Family: More than three times a week    Attends Religious Services: More  than 4 times per year    Active Member of Clubs or Organizations: Yes    Attends Banker Meetings: More than 4 times per year    Marital Status: Widowed  Catering manager Violence: Not on file    Family History  Problem Relation Age of Onset   Cancer Brother        prostate or colon unsure which    Breast cancer Neg Hx     Allergies  Allergen Reactions   Codeine Itching   Penicillins Other (See Comments)    unknown    Review of Systems  Constitutional: Negative.  Negative for chills, fever, malaise/fatigue and weight loss.  HENT: Negative.  Negative for hearing loss, sinus pain and sore throat.   Eyes: Negative.   Respiratory: Negative.  Negative  for cough and shortness of breath.   Cardiovascular: Negative.  Negative for chest pain, palpitations and leg swelling.  Gastrointestinal: Negative.  Negative for abdominal pain, constipation, diarrhea, heartburn, nausea and vomiting.  Genitourinary: Negative.  Negative for dysuria and flank pain.  Musculoskeletal: Negative.  Negative for joint pain and myalgias.  Skin: Negative.   Neurological: Negative.  Negative for dizziness and headaches.  Endo/Heme/Allergies: Negative.   Psychiatric/Behavioral: Negative.  Negative for depression and suicidal ideas. The patient is not nervous/anxious.        Objective:   BP 124/82   Pulse 65   Ht 5\' 2"  (1.575 m)   Wt 190 lb 3.2 oz (86.3 kg)   SpO2 95%   BMI 34.79 kg/m   Vitals:   09/02/22 1255  BP: 124/82  Pulse: 65  Height: 5\' 2"  (1.575 m)  Weight: 190 lb 3.2 oz (86.3 kg)  SpO2: 95%  BMI (Calculated): 34.78    Physical Exam Vitals and nursing note reviewed.  Constitutional:      Appearance: Normal appearance.  HENT:     Head: Normocephalic and atraumatic.     Nose: Nose normal.     Mouth/Throat:     Mouth: Mucous membranes are moist.     Pharynx: Oropharynx is clear.  Eyes:     Conjunctiva/sclera: Conjunctivae normal.     Pupils: Pupils are equal, round, and reactive to light.  Cardiovascular:     Rate and Rhythm: Normal rate and regular rhythm.     Pulses: Normal pulses.     Heart sounds: Normal heart sounds. No murmur heard. Pulmonary:     Effort: Pulmonary effort is normal.     Breath sounds: Normal breath sounds. No wheezing.  Abdominal:     General: Bowel sounds are normal.     Palpations: Abdomen is soft.     Tenderness: There is no abdominal tenderness. There is no right CVA tenderness or left CVA tenderness.  Musculoskeletal:        General: Normal range of motion.     Cervical back: Normal range of motion.     Right lower leg: No edema.     Left lower leg: No edema.  Skin:    General: Skin is warm and dry.   Neurological:     General: No focal deficit present.     Mental Status: She is alert and oriented to person, place, and time.  Psychiatric:        Mood and Affect: Mood normal.        Behavior: Behavior normal.      No results found for any visits on 09/02/22.  No results found for this or any  previous visit (from the past 2160 hour(s)).    Assessment & Plan:  Patient advised to continue taking all her medications regularly.  She also needs to keep her appointments for mammogram and DEXA scan.  Return fasting for blood work. Problem List Items Addressed This Visit     NSTEMI (non-ST elevated myocardial infarction) (HCC)   Hypothyroid   Essential hypertension, benign - Primary   Rheumatoid arthritis (HCC)   Relevant Medications   folic acid (FOLVITE) 1 MG tablet   Mixed hyperlipidemia    Return in about 3 months (around 12/03/2022).   Total time spent: 30 minutes  Margaretann Loveless, MD  09/02/2022   This document may have been prepared by [facility] Voice Recognition software and as such may include unintentional dictation errors.

## 2022-09-06 DIAGNOSIS — M069 Rheumatoid arthritis, unspecified: Secondary | ICD-10-CM | POA: Diagnosis not present

## 2022-09-07 ENCOUNTER — Inpatient Hospital Stay: Payer: Medicare Other | Attending: Obstetrics and Gynecology | Admitting: Obstetrics and Gynecology

## 2022-09-07 VITALS — BP 142/78 | HR 65 | Temp 97.9°F | Resp 19 | Wt 186.5 lb

## 2022-09-07 DIAGNOSIS — Z90722 Acquired absence of ovaries, bilateral: Secondary | ICD-10-CM | POA: Insufficient documentation

## 2022-09-07 DIAGNOSIS — Z90710 Acquired absence of both cervix and uterus: Secondary | ICD-10-CM | POA: Insufficient documentation

## 2022-09-07 DIAGNOSIS — Z8542 Personal history of malignant neoplasm of other parts of uterus: Secondary | ICD-10-CM | POA: Insufficient documentation

## 2022-09-07 DIAGNOSIS — C541 Malignant neoplasm of endometrium: Secondary | ICD-10-CM

## 2022-09-07 NOTE — Progress Notes (Signed)
Gynecologic Oncology Consult Visit   Referring Provider: Dr. Adelene Idler  Chief Concern: high grade endometrial cancer, surveillance  Subjective:  Kimberly Burke is a 82 y.o. G3P3 female with h/o STEMI s/p stent placement who is seen in consultation from Dr. Bjorn Pippin for high grade endometrial cancer s/p EUA, TLH-BSO with SLN mapping and dissection with Dr. Oneita Jolly at [facility] on 06/19/2020. She opted for active surveillance and returns for follow up.   She continues to feel well and denies vaginal discharge or bleeding. No pelvic pain or changes in stool. Follows with Rheumatology for RA.   Last imaging was 11/30/20- CT Abdomen pelvis Wo contrast.   Gynecologic Oncology History:  Kimberly Burke is a pleasant G3P3 female with h/o STEMI s/p stent placement who is seen in consultation from Dr. Bjorn Pippin for high grade endometrial cancer s/p EUA, TLH-BSO with SLN mapping and dissection for stage Ia disease.  She presented initially with postmenopausal bleeding and was seen by Dr. Bjorn Pippin for evaluation. As part of her evaluation with her primary care office a CT was obtained of the abdomen and pelvis. CT 05/22/2020 showed several findings:  There was a simple appearing cyst on the left lobe of the liver with simple with a maximum dimension of 4.3 cm.  This was not substantially changed from her prior exam.  There were multiple simple appearing right renal cyst primary in the mid and lower lobe the largest arising from the lower lobe measuring 3.8 cm.  2 other cyst in the right kidney were 2.5 to 3 cm in size.  There is no evidence of adrenal or renal mass or renal obstruction on the study obtained without IV contrast. Prominent appearance of the right ovary which contains a focus of calcification.  The right ovary measures approximately 4 cm. The endometrial stripe appears abnormally thickened for a postmenopausal patient measuring 1.7 cm. There is no fluid or free fluid noted in the pelvis.        05/27/2020 EMBx A. 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  Uterus sounded to 8 cm  06/19/20 Underwent total laparoscopic hysterectomy, bilateral salpingoophorectomy, bilateral sentinel lymph node biopsies, pelvic washings. 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  Mismatch repair: INTACT A. Immunohistochemistry: Normal result. Expression of MLH1, MSH2, MSH6, and PMS2 is retained. B. PCR: Microsatellite stable (MSS).     % of Cells Staining Intensity Score Interpretation  ER IHC 31 1+ POSITIVE  PR IHC <1 2+ NEGATIVE  HER2/neu IHC 40 1+ NEGATIVE    Cytology Scant cytologically bland cells present, suggestive of endometrial cells.  Comment:  There is no evidence of malignancy.  A single group of bland cells resembling endometrial epithelium is present.  The differential diagnosis includes endometriosis versus refluxed benign endometrium.  Clinical correlation with the corresponding surgical pathology specimen ([id number]) is recommended.    Recommendations Tumor Board at [facility] active surveillance; [facility] Tumor Board recommend VBT. Can discuss systemic chemotherapy.  NCCN guidelines reviewed. For stage 1A noninvasive options include VBT or observation if negative washings. Her washings did not definitively demonstrate malignancy. We reviewed recommendations from [facility] and [facility] Tumor Board as well as NCCN guidelines. We reviewed pros and risks of additional therapy. Risk of recurrence ranges from  10-22%. With radiation the recurrence rate would be reduced to ~5-7%. She would like to proceed with active surveillance at this time. She will contact me if she changes her mind and if so we  will refer to Dr. Rushie Chestnut.   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 06/03/2022   Rotator cuff tear arthropathy of right shoulder 02/24/2022   Encounter for follow-up surveillance of endometrial cancer 01/12/2022   Coronary artery disease involving native coronary artery of native heart 06/15/2020   Medicare annual wellness visit, subsequent 06/15/2020   Chronic kidney disease 06/03/2020   Venous stasis 06/03/2020   Endometrial cancer (HCC) 06/03/2020   NSTEMI (non-ST elevated myocardial infarction) (HCC) 12/03/2018   Hypothyroid 12/03/2018   Essential hypertension, benign 12/03/2018   Rheumatoid arthritis (HCC) 12/03/2018   Degenerative arthritis 08/14/2013    Past Medical History: Past Medical History:  Diagnosis Date   Arthritis    Endometrial cancer (HCC)    Hypertension    Hypothyroid    NSTEMI (non-ST elevated myocardial infarction) (HCC)    Rheumatoid arthritis (HCC)    Rotator cuff tear arthropathy of right shoulder 02/24/2022   Thyroid disease     Past Surgical History: Past Surgical History:  Procedure Laterality Date   ABDOMINAL HYSTERECTOMY  06/19/2020   EUA, TLH-BSO with SLN mapping and dissection with Dr. Oneita Jolly at [facility]    CORONARY STENT INTERVENTION N/A 12/04/2018   Procedure: CORONARY STENT INTERVENTION;  Surgeon: Alwyn Pea, MD;  Location: [facility] INVASIVE CV LAB;  Service: Cardiovascular;  Laterality: N/A;   LEFT HEART CATH AND CORONARY ANGIOGRAPHY N/A 12/04/2018   Procedure: LEFT HEART CATH AND CORONARY ANGIOGRAPHY;  Surgeon: Laurier Nancy, MD;  Location: [facility] INVASIVE CV LAB;  Service: Cardiovascular;  Laterality: N/A;    Past Gynecologic History:  Menarche: 13 Menopause; 41 Last Pap unknown; no abnormal Pap   OB History: SVD x 3 OB History  Gravida Para Term  Preterm AB Living  3 3          SAB IAB Ectopic Multiple Live Births               # Outcome Date GA Lbr Len/2nd Weight Sex Type Anes PTL Lv  3 Para           2 Para           1 Para             Family History: Family History  Problem Relation Age of Onset   Cancer Brother        prostate or colon unsure which    Breast cancer Neg Hx     Social History: Social History   Socioeconomic History   Marital status: Widowed    Spouse name: Not on file   Number of children: Not on file   Years of education: Not on file   Highest education level: Not on file  Occupational History   Not on file  Tobacco Use   Smoking status: Former   Smokeless tobacco: Never  Vaping Use   Vaping status: Never Used  Substance and Sexual Activity   Alcohol use: Never   Drug use: Never   Sexual activity: Not Currently  Other Topics Concern   Not on file  Social History Narrative   Lives with daughter   Social Determinants of Health  Financial Resource Strain: Low Risk  (09/02/2021)   Overall Financial Resource Strain (CARDIA)    Difficulty of Paying Living Expenses: Not hard at all  Food Insecurity: No Food Insecurity (09/09/2021)   Hunger Vital Sign    Worried About Running Out of Food in the Last Year: Never true    Ran Out of Food in the Last Year: Never true  Transportation Needs: No Transportation Needs (09/09/2021)   PRAPARE - Administrator, Civil Service (Medical): No    Lack of Transportation (Non-Medical): No  Physical Activity: Insufficiently Active (09/02/2021)   Exercise Vital Sign    Days of Exercise per Week: 5 days    Minutes of Exercise per Session: 10 min  Stress: No Stress Concern Present (09/02/2021)   [employer] of Occupational Health - Occupational Stress Questionnaire    Feeling of Stress : Not at all  Social Connections: Moderately Integrated (09/02/2021)   Social Connection and Isolation Panel [NHANES]    Frequency of Communication with  Friends and Family: More than three times a week    Frequency of Social Gatherings with Friends and Family: More than three times a week    Attends Religious Services: More than 4 times per year    Active Member of [employer] or Organizations: Yes    Attends Banker Meetings: More than 4 times per year    Marital Status: Widowed  Intimate Partner Violence: Not on file    Allergies: Allergies  Allergen Reactions   Codeine Itching   Penicillins Other (See Comments)    unknown    Current Medications: Current Outpatient Medications  Medication Sig Dispense Refill   acetaminophen (TYLENOL) 650 MG CR tablet Take 1,300 mg by mouth daily.     aspirin 81 MG chewable tablet Chew 1 tablet (81 mg total) by mouth daily.     BRILINTA 60 MG TABS tablet TAKE 1 TABLET(60 MG) BY MOUTH TWICE DAILY 60 tablet 3   celecoxib (CELEBREX) 200 MG capsule Take 200 mg by mouth daily.     certolizumab pegol (CIMZIA) 2 X 200 MG/ML PSKT prefilled syringe Inject into the skin.     Cholecalciferol 1.25 MG (50000 UT) capsule Take by mouth.     DULoxetine (CYMBALTA) 30 MG capsule Take 30 mg by mouth daily.     fluticasone (FLONASE) 50 MCG/ACT nasal spray Place 1 spray into both nostrils daily. 11.1 mL 6   folic acid (FOLVITE) 1 MG tablet Take 1 tablet (1 mg total) by mouth daily. 90 tablet 3   gabapentin (NEURONTIN) 100 MG capsule Take 100 mg by mouth 3 (three) times daily.     hydrochlorothiazide (HYDRODIURIL) 25 MG tablet Take 1 tablet (25 mg total) by mouth daily. 90 tablet 3   hydroxychloroquine (PLAQUENIL) 200 MG tablet Take by mouth.     levothyroxine (SYNTHROID) 50 MCG tablet TAKE 1 TABLET BY MOUTH EVERY DAY 30 tablet 2   losartan (COZAAR) 25 MG tablet TAKE 1 TABLET BY MOUTH DAILY 90 tablet 3   meloxicam (MOBIC) 15 MG tablet TAKE 1 TABLET BY MOUTH EVERY DAY 30 tablet 3   methotrexate (RHEUMATREX) 2.5 MG tablet Take 15 mg by mouth once a week.     metoprolol succinate (TOPROL-XL) 100 MG 24 hr tablet  TAKE 1 TABLET BY MOUTH EVERY DAY 90 tablet 0   pantoprazole (PROTONIX) 40 MG tablet Take 1 tablet (40 mg total) by mouth daily. 90 tablet 3   rosuvastatin (CRESTOR) 40 MG tablet Take  1 tablet (40 mg total) by mouth daily at 6 PM. 30 tablet 1   No current facility-administered medications for this visit.   Review of Systems General:  no complaints Skin: no complaints Eyes: no complaints HEENT: no complaints Breasts: no complaints Pulmonary: no complaints Cardiac: no complaints Gastrointestinal: rectal bleeding x 1 episode of BRB due to hemorrhoid now resolved Genitourinary/Sexual: no complaints Ob/Gyn: no complaints Musculoskeletal: back pain- new Hematology: no complaints Neurologic/Psych: no complaints  Objective:  Physical Examination:  BP (!) 142/78   Pulse 65   Temp 97.9 F (36.6 C)   Resp 19   Wt 186 lb 8 oz (84.6 kg)   SpO2 98%   BMI 34.11 kg/m    ECOG Performance Status: 1 - Symptomatic but completely ambulatory  GENERAL: Patient is a well appearing female in no acute distress HEENT:  Sclera clear. Anicteric NODES:  No axillary, inguinal, or supraclavicular adenopathy LUNGS:  Normal respiratory rate   ABDOMEN:  Soft, nontender, nondistended.  Upper abdominal wall laxity. No masses or ascites.  EXTREMITIES:  No peripheral edema. Atraumatic. No cyanosis SKIN:  Clear with no obvious rashes or skin changes.  NEURO:  Nonfocal. Well oriented.  Appropriate affect.  Pelvic: Exam chaperoned by NP EGBUS: no lesions Cervix: surgically absent Vagina: no lesions, no discharge or bleeding. Vaginal cuff well healed  Uterus: surgically absent BME: no palpable masses Rectal: deferred  Lab Review N/A  Radiologic Imaging: As per HPI  Assessment:  Kimberly Burke is a 82 y.o. 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 Discussed VBT vs surveillance post op and patient elected for surveillance. Clinically, asymptomatic. NED on exam. Interval CT scan 11/22 was negative for radiographic evidence of disease.  NED on exam today.  Possible abdominal hernia versus laxity- imaging in November 2022 revealed small fat containing right lateral abdominal wall hernia, asymptomatic  Back pain, suspect due to arthritis   Right ovarian cyst with calcification  Hepatic and renal cysts  Recent bone density scan 7/23 shows osteopenia.  Follows with Rheumatology for RA.   Medical co-morbidities complicating care: HTN, h/o STEMI 2020 s/p stent placement (Dr. Welton Flakes is her cardiologist), RA (Dr. Lavenia Atlas), hypothyroidism.  Plan:   Problem List Items Addressed This Visit       Genitourinary   Endometrial cancer (HCC) - Primary   Previously reviewed surveillance guidelines. Recommend continued surveillance in 6 months.    Patient may be interested in sexual activity and we reassured her that her anatomy was normal, aside from the absence of the uterus and cervix. We gave her some samples of vaginal moisturizer/lubricant that may be helpful.  The patient's diagnosis, an outline of the further diagnostic and laboratory studies which will be required, the recommendation, and alternatives were discussed.  All questions were answered to the patient's satisfaction. Leida Lauth, MD

## 2022-09-22 ENCOUNTER — Other Ambulatory Visit: Payer: Medicare Other

## 2022-09-22 DIAGNOSIS — I214 Non-ST elevation (NSTEMI) myocardial infarction: Secondary | ICD-10-CM

## 2022-09-22 DIAGNOSIS — M1712 Unilateral primary osteoarthritis, left knee: Secondary | ICD-10-CM | POA: Diagnosis not present

## 2022-09-22 DIAGNOSIS — E038 Other specified hypothyroidism: Secondary | ICD-10-CM

## 2022-09-22 DIAGNOSIS — Z79899 Other long term (current) drug therapy: Secondary | ICD-10-CM | POA: Diagnosis not present

## 2022-09-22 DIAGNOSIS — I10 Essential (primary) hypertension: Secondary | ICD-10-CM

## 2022-09-22 DIAGNOSIS — E782 Mixed hyperlipidemia: Secondary | ICD-10-CM

## 2022-09-22 DIAGNOSIS — M0579 Rheumatoid arthritis with rheumatoid factor of multiple sites without organ or systems involvement: Secondary | ICD-10-CM | POA: Diagnosis not present

## 2022-09-22 DIAGNOSIS — M05731 Rheumatoid arthritis with rheumatoid factor of right wrist without organ or systems involvement: Secondary | ICD-10-CM

## 2022-09-23 LAB — CBC WITH DIFFERENTIAL/PLATELET
Basophils Absolute: 0.1 10*3/uL (ref 0.0–0.2)
Basos: 1 %
EOS (ABSOLUTE): 0.2 10*3/uL (ref 0.0–0.4)
Eos: 4 %
Hematocrit: 38.3 % (ref 34.0–46.6)
Hemoglobin: 12.4 g/dL (ref 11.1–15.9)
Immature Grans (Abs): 0 10*3/uL (ref 0.0–0.1)
Immature Granulocytes: 1 %
Lymphocytes Absolute: 1.7 10*3/uL (ref 0.7–3.1)
Lymphs: 27 %
MCH: 30.8 pg (ref 26.6–33.0)
MCHC: 32.4 g/dL (ref 31.5–35.7)
MCV: 95 fL (ref 79–97)
Monocytes Absolute: 0.6 10*3/uL (ref 0.1–0.9)
Monocytes: 10 %
Neutrophils Absolute: 3.5 10*3/uL (ref 1.4–7.0)
Neutrophils: 57 %
Platelets: 201 10*3/uL (ref 150–450)
RBC: 4.02 x10E6/uL (ref 3.77–5.28)
RDW: 14.5 % (ref 11.7–15.4)
WBC: 6.2 10*3/uL (ref 3.4–10.8)

## 2022-09-23 LAB — CMP14+EGFR
ALT: 30 IU/L (ref 0–32)
AST: 23 IU/L (ref 0–40)
Albumin: 4.1 g/dL (ref 3.7–4.7)
Alkaline Phosphatase: 99 IU/L (ref 44–121)
BUN/Creatinine Ratio: 18 (ref 12–28)
BUN: 15 mg/dL (ref 8–27)
Bilirubin Total: 0.4 mg/dL (ref 0.0–1.2)
CO2: 28 mmol/L (ref 20–29)
Calcium: 10.8 mg/dL — ABNORMAL HIGH (ref 8.7–10.3)
Chloride: 101 mmol/L (ref 96–106)
Creatinine, Ser: 0.82 mg/dL (ref 0.57–1.00)
Globulin, Total: 3.3 g/dL (ref 1.5–4.5)
Glucose: 93 mg/dL (ref 70–99)
Potassium: 4 mmol/L (ref 3.5–5.2)
Sodium: 141 mmol/L (ref 134–144)
Total Protein: 7.4 g/dL (ref 6.0–8.5)
eGFR: 72 mL/min/{1.73_m2} (ref >59)

## 2022-09-23 LAB — TSH: TSH: 2.68 u[IU]/mL (ref 0.450–4.500)

## 2022-09-23 LAB — LIPID PANEL W/O CHOL/HDL RATIO
Cholesterol, Total: 79 mg/dL — ABNORMAL LOW (ref 100–199)
HDL: 33 mg/dL — ABNORMAL LOW (ref >39)
LDL Chol Calc (NIH): 31 mg/dL (ref 0–99)
Triglycerides: 64 mg/dL (ref 0–149)
VLDL Cholesterol Cal: 15 mg/dL (ref 5–40)

## 2022-09-27 NOTE — Progress Notes (Signed)
Patient notified

## 2022-10-04 DIAGNOSIS — M0579 Rheumatoid arthritis with rheumatoid factor of multiple sites without organ or systems involvement: Secondary | ICD-10-CM | POA: Diagnosis not present

## 2022-10-04 DIAGNOSIS — M069 Rheumatoid arthritis, unspecified: Secondary | ICD-10-CM | POA: Diagnosis not present

## 2022-10-06 ENCOUNTER — Other Ambulatory Visit: Payer: Self-pay | Admitting: Cardiovascular Disease

## 2022-10-29 ENCOUNTER — Other Ambulatory Visit: Payer: Self-pay | Admitting: Cardiovascular Disease

## 2022-11-01 DIAGNOSIS — M069 Rheumatoid arthritis, unspecified: Secondary | ICD-10-CM | POA: Diagnosis not present

## 2022-11-11 ENCOUNTER — Other Ambulatory Visit: Payer: Self-pay | Admitting: Cardiovascular Disease

## 2022-11-11 DIAGNOSIS — I2510 Atherosclerotic heart disease of native coronary artery without angina pectoris: Secondary | ICD-10-CM

## 2022-11-14 ENCOUNTER — Ambulatory Visit (INDEPENDENT_AMBULATORY_CARE_PROVIDER_SITE_OTHER): Payer: Medicare Other | Admitting: Cardiology

## 2022-11-14 ENCOUNTER — Encounter: Payer: Self-pay | Admitting: Cardiology

## 2022-11-14 ENCOUNTER — Other Ambulatory Visit: Payer: Self-pay

## 2022-11-14 VITALS — BP 132/78 | HR 67 | Ht 63.0 in | Wt 191.2 lb

## 2022-11-14 DIAGNOSIS — I10 Essential (primary) hypertension: Secondary | ICD-10-CM

## 2022-11-14 DIAGNOSIS — R109 Unspecified abdominal pain: Secondary | ICD-10-CM | POA: Diagnosis not present

## 2022-11-14 DIAGNOSIS — Z23 Encounter for immunization: Secondary | ICD-10-CM | POA: Diagnosis not present

## 2022-11-14 LAB — POCT URINALYSIS DIPSTICK
Bilirubin, UA: NEGATIVE
Blood, UA: NEGATIVE
Glucose, UA: NEGATIVE
Ketones, UA: NEGATIVE
Leukocytes, UA: NEGATIVE
Nitrite, UA: NEGATIVE
Protein, UA: POSITIVE — AB
Spec Grav, UA: 1.015 (ref 1.010–1.025)
Urobilinogen, UA: 0.2 U/dL
pH, UA: 6 (ref 5.0–8.0)

## 2022-11-14 MED ORDER — LOSARTAN POTASSIUM 25 MG PO TABS: 25.0000 mg | ORAL_TABLET | Freq: Every day | ORAL | 3 refills | Status: DC

## 2022-11-14 MED ORDER — LEVOTHYROXINE SODIUM 50 MCG PO TABS: 50.0000 ug | ORAL_TABLET | Freq: Every day | ORAL | 2 refills | Status: DC

## 2022-11-14 NOTE — Patient Instructions (Addendum)
Continue salon pas or Voltaren gel  Take tylenol for headache Urine today

## 2022-11-14 NOTE — Progress Notes (Signed)
Established Patient Office Visit  Subjective:  Patient ID: Kimberly Burke, female    DOB: [birth year]  Age: 82 y.o. MRN: [id number]  Chief Complaint  Patient presents with   Pain    Left arm/shoulder pain    Patient in office for an acute visit with multiple vague complaints. Patient complaining of pain "ache" in her right side, comes and goes, history of kidney stones. Will get a UA today.  Left arm pain deltoid area, comes and goes, has rheumatoid arthritis. Has bene using salonpas with some relief.  Temporal headache started yesterday, states she has been out of her blood pressure medication. After reviewing her medication bottles, patient has been out of her levothyroxine and her losartan. Both refilled today. Recommend tylenol for the headache.     No other concerns at this time.   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 Oneita Jolly at [facility]    CORONARY STENT INTERVENTION N/A 12/04/2018   Procedure: CORONARY STENT INTERVENTION;  Surgeon: Alwyn Pea, MD;  Location: [facility] INVASIVE CV LAB;  Service: Cardiovascular;  Laterality: N/A;   LEFT HEART CATH AND CORONARY ANGIOGRAPHY N/A 12/04/2018   Procedure: LEFT HEART CATH AND CORONARY ANGIOGRAPHY;  Surgeon: Laurier Nancy, MD;  Location: [facility] INVASIVE CV LAB;  Service: Cardiovascular;  Laterality: N/A;    Social History   Socioeconomic History   Marital status: Widowed    Spouse name: Not on file   Number of children: Not on file   Years of education: Not on file   Highest education level: Not on file  Occupational History   Not on file   Tobacco Use   Smoking status: Former   Smokeless tobacco: Never  Vaping Use   Vaping status: Never Used  Substance and Sexual Activity   Alcohol use: Never   Drug use: Never   Sexual activity: Not Currently  Other Topics Concern   Not on file  Social History Narrative   Lives with daughter   Social Determinants of Health   Financial Resource Strain: Low Risk  (09/02/2021)   Overall Financial Resource Strain (CARDIA)    Difficulty of Paying Living Expenses: Not hard at all  Food Insecurity: No Food Insecurity (09/09/2021)   Hunger Vital Sign    Worried About Running Out of Food in the Last Year: Never true    Ran Out of Food in the Last Year: Never true  Transportation Needs: No Transportation Needs (09/09/2021)   PRAPARE - Administrator, Civil Service (Medical): No    Lack of Transportation (Non-Medical): No  Physical Activity: Insufficiently Active (09/02/2021)   Exercise Vital Sign    Days of Exercise per Week: 5 days    Minutes of Exercise per Session: 10 min  Stress: No Stress Concern Present (09/02/2021)   [employer] of Occupational Health - Occupational Stress Questionnaire    Feeling of Stress : Not at all  Social Connections: Moderately Integrated (09/02/2021)   Social Connection and Isolation Panel [NHANES]    Frequency of Communication with Friends and Family: More  than three times a week    Frequency of Social Gatherings with Friends and Family: More than three times a week    Attends Religious Services: More than 4 times per year    Active Member of Clubs or Organizations: Yes    Attends Banker Meetings: More than 4 times per year    Marital Status: Widowed  Catering manager Violence: Not on file    Family History  Problem Relation Age of Onset   Cancer Brother        prostate or colon unsure which    Breast cancer Neg Hx     Allergies  Allergen Reactions   Codeine Itching   Penicillins Other (See Comments)    unknown     Review of Systems  Constitutional: Negative.   HENT: Negative.    Eyes: Negative.   Respiratory: Negative.  Negative for shortness of breath.   Cardiovascular: Negative.  Negative for chest pain.  Gastrointestinal: Negative.  Negative for abdominal pain, constipation and diarrhea.  Genitourinary: Negative.  Negative for dysuria and frequency.  Musculoskeletal:  Negative for joint pain and myalgias.  Skin: Negative.   Neurological: Negative.  Negative for dizziness and headaches.  Endo/Heme/Allergies: Negative.   All other systems reviewed and are negative.      Objective:   BP 132/78   Pulse 67   Ht 5\' 3"  (1.6 m)   Wt 191 lb 3.2 oz (86.7 kg)   SpO2 95%   BMI 33.87 kg/m   Vitals:   11/14/22 1148  BP: 132/78  Pulse: 67  Height: 5\' 3"  (1.6 m)  Weight: 191 lb 3.2 oz (86.7 kg)  SpO2: 95%  BMI (Calculated): 33.88    Physical Exam Vitals and nursing note reviewed.  Constitutional:      Appearance: Normal appearance. She is normal weight.  HENT:     Head: Normocephalic and atraumatic.     Nose: Nose normal.     Mouth/Throat:     Mouth: Mucous membranes are moist.  Eyes:     Extraocular Movements: Extraocular movements intact.     Conjunctiva/sclera: Conjunctivae normal.     Pupils: Pupils are equal, round, and reactive to light.  Cardiovascular:     Rate and Rhythm: Normal rate and regular rhythm.     Pulses: Normal pulses.     Heart sounds: Normal heart sounds.  Pulmonary:     Effort: Pulmonary effort is normal.     Breath sounds: Normal breath sounds.  Abdominal:     General: Abdomen is flat. Bowel sounds are normal.     Palpations: Abdomen is soft.  Musculoskeletal:        General: Normal range of motion.     Cervical back: Normal range of motion.  Skin:    General: Skin is warm and dry.  Neurological:     General: No focal deficit present.     Mental Status: She is alert and oriented to person, place, and time.  Psychiatric:        Mood and  Affect: Mood normal.        Behavior: Behavior normal.        Thought Content: Thought content normal.        Judgment: Judgment normal.      Results for orders placed or performed in visit on 11/14/22  POCT Urinalysis Dipstick (81002)  Result Value Ref Range   Color, UA     Clarity, UA     Glucose, UA  Negative Negative   Bilirubin, UA Negative    Ketones, UA Negative    Spec Grav, UA 1.015 1.010 - 1.025   Blood, UA Negative    pH, UA 6.0 5.0 - 8.0   Protein, UA Positive (A) Negative   Urobilinogen, UA 0.2 0.2 or 1.0 E.U./dL   Nitrite, UA Negative    Leukocytes, UA Negative Negative   Appearance     Odor      Recent Results (from the past 2160 hour(s))  Lipid Panel w/o Chol/HDL Ratio     Status: Abnormal   Collection Time: 09/22/22  8:51 AM  Result Value Ref Range   Cholesterol, Total 79 (L) 100 - 199 mg/dL   Triglycerides 64 0 - 149 mg/dL   HDL 33 (L) >19 mg/dL   VLDL Cholesterol Cal 15 5 - 40 mg/dL   LDL Chol Calc (NIH) 31 0 - 99 mg/dL  [id number]     Status: Abnormal   Collection Time: 09/22/22  8:51 AM  Result Value Ref Range   Glucose 93 70 - 99 mg/dL   BUN 15 8 - 27 mg/dL   Creatinine, Ser 1.30 0.57 - 1.00 mg/dL   eGFR 72 >86 VH/QIO/9.62   BUN/Creatinine Ratio 18 12 - 28   Sodium 141 134 - 144 mmol/L   Potassium 4.0 3.5 - 5.2 mmol/L   Chloride 101 96 - 106 mmol/L   CO2 28 20 - 29 mmol/L   Calcium 10.8 (H) 8.7 - 10.3 mg/dL   Total Protein 7.4 6.0 - 8.5 g/dL   Albumin 4.1 3.7 - 4.7 g/dL   Globulin, Total 3.3 1.5 - 4.5 g/dL   Bilirubin Total 0.4 0.0 - 1.2 mg/dL   Alkaline Phosphatase 99 44 - 121 IU/L   AST 23 0 - 40 IU/L   ALT 30 0 - 32 IU/L  TSH     Status: None   Collection Time: 09/22/22  8:51 AM  Result Value Ref Range   TSH 2.680 0.450 - 4.500 uIU/mL  CBC with Differential/Platelet     Status: None   Collection Time: 09/22/22  8:51 AM  Result Value Ref Range   WBC 6.2 3.4 - 10.8 x10E3/uL   RBC 4.02 3.77 - 5.28 x10E6/uL   Hemoglobin 12.4 11.1 -  15.9 g/dL   Hematocrit 95.2 84.1 - 46.6 %   MCV 95 79 - 97 fL   MCH 30.8 26.6 - 33.0 pg   MCHC 32.4 31.5 - 35.7 g/dL   RDW 32.4 40.1 - 02.7 %   Platelets 201 150 - 450 x10E3/uL   Neutrophils 57 Not Estab. %   Lymphs 27 Not Estab. %   Monocytes 10 Not Estab. %   Eos 4 Not Estab. %   Basos 1 Not Estab. %   Neutrophils Absolute 3.5 1.4 - 7.0 x10E3/uL   Lymphocytes Absolute 1.7 0.7 - 3.1 x10E3/uL   Monocytes Absolute 0.6 0.1 - 0.9 x10E3/uL   EOS (ABSOLUTE) 0.2 0.0 - 0.4 x10E3/uL   Basophils Absolute 0.1 0.0 - 0.2 x10E3/uL   Immature Granulocytes 1 Not Estab. %   Immature Grans (Abs) 0.0 0.0 - 0.1 x10E3/uL  POCT Urinalysis Dipstick (25366)     Status: Abnormal   Collection Time: 11/14/22 12:27 PM  Result Value Ref Range   Color, UA     Clarity, UA     Glucose, UA Negative Negative   Bilirubin, UA Negative    Ketones, UA Negative    Spec Grav, UA 1.015 1.010 -  1.025   Blood, UA Negative    pH, UA 6.0 5.0 - 8.0   Protein, UA Positive (A) Negative   Urobilinogen, UA 0.2 0.2 or 1.0 E.U./dL   Nitrite, UA Negative    Leukocytes, UA Negative Negative   Appearance     Odor        Assessment & Plan:  Continue using salonpas or voltaren gel for intermittent arm discomfort.  Tylenol for headache.  UA processing.   Problem List Items Addressed This Visit       Cardiovascular and Mediastinum   Essential hypertension, benign   Relevant Medications   losartan (COZAAR) 25 MG tablet     Other   Flank pain - Primary   Relevant Orders   POCT Urinalysis Dipstick (21308) (Completed)   Other Visit Diagnoses     Needs flu shot       Relevant Orders   Flu Vaccine Trivalent High Dose (Fluad) (Completed)       Return if symptoms worsen or fail to improve, for keep appointment with NK.   Total time spent: 25 minutes  [employer], NP  11/14/2022   This document may have been prepared by Dragon Voice Recognition software and as such may include unintentional dictation  errors.

## 2022-11-27 ENCOUNTER — Other Ambulatory Visit: Payer: Self-pay | Admitting: Internal Medicine

## 2022-12-01 ENCOUNTER — Other Ambulatory Visit: Payer: Self-pay | Admitting: Internal Medicine

## 2022-12-08 ENCOUNTER — Encounter: Payer: Self-pay | Admitting: Internal Medicine

## 2022-12-08 ENCOUNTER — Ambulatory Visit (INDEPENDENT_AMBULATORY_CARE_PROVIDER_SITE_OTHER): Payer: Medicare Other | Admitting: Internal Medicine

## 2022-12-08 VITALS — BP 124/78 | HR 71 | Ht 63.0 in | Wt 190.2 lb

## 2022-12-08 DIAGNOSIS — E782 Mixed hyperlipidemia: Secondary | ICD-10-CM | POA: Diagnosis not present

## 2022-12-08 DIAGNOSIS — J301 Allergic rhinitis due to pollen: Secondary | ICD-10-CM

## 2022-12-08 DIAGNOSIS — I214 Non-ST elevation (NSTEMI) myocardial infarction: Secondary | ICD-10-CM | POA: Diagnosis not present

## 2022-12-08 DIAGNOSIS — I10 Essential (primary) hypertension: Secondary | ICD-10-CM | POA: Diagnosis not present

## 2022-12-08 DIAGNOSIS — E039 Hypothyroidism, unspecified: Secondary | ICD-10-CM

## 2022-12-08 DIAGNOSIS — M069 Rheumatoid arthritis, unspecified: Secondary | ICD-10-CM | POA: Diagnosis not present

## 2022-12-08 DIAGNOSIS — M05711 Rheumatoid arthritis with rheumatoid factor of right shoulder without organ or systems involvement: Secondary | ICD-10-CM

## 2022-12-08 MED ORDER — BRILINTA 60 MG PO TABS: 60.0000 mg | ORAL_TABLET | Freq: Two times a day (BID) | ORAL | 3 refills | Status: DC

## 2022-12-08 NOTE — Progress Notes (Signed)
Established Patient Office Visit  Subjective:  Patient ID: Kimberly Burke, female    DOB: 1940/07/07  Age: 82 y.o. MRN: [id number]  Chief Complaint  Patient presents with   Follow-up    3 month    Patient is here for her follow-up today.  She is generally feeling well and is taking her medications.  However she missed her DEXA and mammogram again, as she was taking care of her sister who is in poor health.  Will reschedule both.  She is not due for her labs yet.  Will check it at next visit.    No other concerns at this time.   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 Oneita Jolly at [facility]    CORONARY STENT INTERVENTION N/A 12/04/2018   Procedure: CORONARY STENT INTERVENTION;  Surgeon: Alwyn Pea, MD;  Location: [facility] INVASIVE CV LAB;  Service: Cardiovascular;  Laterality: N/A;   LEFT HEART CATH AND CORONARY ANGIOGRAPHY N/A 12/04/2018   Procedure: LEFT HEART CATH AND CORONARY ANGIOGRAPHY;  Surgeon: Laurier Nancy, MD;  Location: [facility] INVASIVE CV LAB;  Service: Cardiovascular;  Laterality: N/A;    Social History   Socioeconomic History   Marital status: Widowed    Spouse name: Not on file   Number of children: Not on file   Years of education: Not on file   Highest education level: Not on file  Occupational History   Not on file  Tobacco Use   Smoking status: Former   Smokeless tobacco: Never  Vaping Use   Vaping status: Never Used  Substance and Sexual Activity   Alcohol use: Never   Drug use: Never   Sexual activity: Not Currently  Other Topics Concern   Not on file  Social  History Narrative   Lives with daughter   Social Determinants of Health   Financial Resource Strain: Low Risk  (09/02/2021)   Overall Financial Resource Strain (CARDIA)    Difficulty of Paying Living Expenses: Not hard at all  Food Insecurity: No Food Insecurity (09/09/2021)   Hunger Vital Sign    Worried About Running Out of Food in the Last Year: Never true    Ran Out of Food in the Last Year: Never true  Transportation Needs: No Transportation Needs (09/09/2021)   PRAPARE - Administrator, Civil Service (Medical): No    Lack of Transportation (Non-Medical): No  Physical Activity: Insufficiently Active (09/02/2021)   Exercise Vital Sign    Days of Exercise per Week: 5 days    Minutes of Exercise per Session: 10 min  Stress: No Stress Concern Present (09/02/2021)   [employer] of Occupational Health - Occupational Stress Questionnaire    Feeling of Stress : Not at all  Social Connections: Moderately Integrated (09/02/2021)   Social Connection and Isolation Panel [NHANES]    Frequency of Communication with Friends and Family: More than three times a week    Frequency of Social Gatherings with Friends and Family: More than three times a week    Attends Religious Services: More than 4  times per year    Active Member of Clubs or Organizations: Yes    Attends Banker Meetings: More than 4 times per year    Marital Status: Widowed  Catering manager Violence: Not on file    Family History  Problem Relation Age of Onset   Cancer Brother        prostate or colon unsure which    Breast cancer Neg Hx     Allergies  Allergen Reactions   Codeine Itching   Penicillins Other (See Comments)    unknown    Outpatient Medications Prior to Visit  Medication Sig   acetaminophen (TYLENOL) 650 MG CR tablet Take 1,300 mg by mouth daily.   aspirin 81 MG chewable tablet Chew 1 tablet (81 mg total) by mouth daily.   celecoxib (CELEBREX) 200 MG capsule Take 200 mg by  mouth daily.   certolizumab pegol (CIMZIA) 2 X 200 MG/ML PSKT prefilled syringe Inject into the skin.   Cholecalciferol 1.25 MG (50000 UT) capsule Take by mouth.   DULoxetine (CYMBALTA) 30 MG capsule TAKE 1 CAPSULE BY MOUTH EVERY DAY FOR MOOD   fluticasone (FLONASE) 50 MCG/ACT nasal spray Place 1 spray into both nostrils daily.   folic acid (FOLVITE) 1 MG tablet Take 1 tablet (1 mg total) by mouth daily.   gabapentin (NEURONTIN) 100 MG capsule Take 100 mg by mouth 3 (three) times daily.   hydrochlorothiazide (HYDRODIURIL) 25 MG tablet Take 1 tablet (25 mg total) by mouth daily.   hydroxychloroquine (PLAQUENIL) 200 MG tablet Take by mouth.   levothyroxine (SYNTHROID) 50 MCG tablet TAKE 1 TABLET BY MOUTH EVERY DAY   losartan (COZAAR) 25 MG tablet Take 1 tablet (25 mg total) by mouth daily.   meloxicam (MOBIC) 15 MG tablet TAKE 1 TABLET BY MOUTH EVERY DAY   methotrexate (RHEUMATREX) 2.5 MG tablet Take 15 mg by mouth once a week.   metoprolol succinate (TOPROL-XL) 100 MG 24 hr tablet TAKE 1 TABLET BY MOUTH EVERY DAY   pantoprazole (PROTONIX) 40 MG tablet Take 1 tablet (40 mg total) by mouth daily.   rosuvastatin (CRESTOR) 40 MG tablet TAKE 1 TABLET BY MOUTH EVERY DAY   [DISCONTINUED] BRILINTA 60 MG TABS tablet TAKE 1 TABLET(60 MG) BY MOUTH TWICE DAILY   No facility-administered medications prior to visit.    Review of Systems  Constitutional: Negative.  Negative for fever and weight loss.  HENT: Negative.  Negative for sore throat.   Eyes: Negative.   Respiratory: Negative.  Negative for cough and shortness of breath.   Cardiovascular: Negative.  Negative for chest pain, palpitations and leg swelling.  Gastrointestinal: Negative.  Negative for abdominal pain, constipation, diarrhea, heartburn, nausea and vomiting.  Genitourinary: Negative.  Negative for dysuria and flank pain.  Musculoskeletal: Negative.  Negative for joint pain and myalgias.  Skin: Negative.   Neurological: Negative.   Negative for dizziness, tingling, tremors, sensory change, speech change and headaches.  Endo/Heme/Allergies: Negative.   Psychiatric/Behavioral: Negative.  Negative for depression and suicidal ideas. The patient is not nervous/anxious.       Objective:   BP 124/78   Pulse 71   Ht 5\' 3"  (1.6 m)   Wt 190 lb 3.2 oz (86.3 kg)   SpO2 92%   BMI 33.69 kg/m   Vitals:   12/08/22 1044  BP: 124/78  Pulse: 71  Height: 5\' 3"  (1.6 m)  Weight: 190 lb 3.2 oz (86.3 kg)  SpO2: 92%  BMI (Calculated): 33.7  Physical Exam Vitals and nursing note reviewed.  Constitutional:      Appearance: Normal appearance.  HENT:     Head: Normocephalic and atraumatic.     Nose: Nose normal.     Mouth/Throat:     Mouth: Mucous membranes are moist.     Pharynx: Oropharynx is clear.  Eyes:     Conjunctiva/sclera: Conjunctivae normal.     Pupils: Pupils are equal, round, and reactive to light.  Cardiovascular:     Rate and Rhythm: Normal rate and regular rhythm.     Pulses: Normal pulses.     Heart sounds: Normal heart sounds. No murmur heard. Pulmonary:     Effort: Pulmonary effort is normal.     Breath sounds: Normal breath sounds. No wheezing.  Abdominal:     General: Bowel sounds are normal.     Palpations: Abdomen is soft.     Tenderness: There is no abdominal tenderness. There is no right CVA tenderness or left CVA tenderness.  Musculoskeletal:        General: Normal range of motion.     Cervical back: Normal range of motion.     Right lower leg: No edema.     Left lower leg: No edema.  Skin:    General: Skin is warm and dry.  Neurological:     General: No focal deficit present.     Mental Status: She is alert and oriented to person, place, and time.  Psychiatric:        Mood and Affect: Mood normal.        Behavior: Behavior normal.      No results found for any visits on 12/08/22.  Recent Results (from the past 2160 hour(s))  Lipid Panel w/o Chol/HDL Ratio     Status:  Abnormal   Collection Time: 09/22/22  8:51 AM  Result Value Ref Range   Cholesterol, Total 79 (L) 100 - 199 mg/dL   Triglycerides 64 0 - 149 mg/dL   HDL 33 (L) >81 mg/dL   VLDL Cholesterol Cal 15 5 - 40 mg/dL   LDL Chol Calc (NIH) 31 0 - 99 mg/dL  [id number]     Status: Abnormal   Collection Time: 09/22/22  8:51 AM  Result Value Ref Range   Glucose 93 70 - 99 mg/dL   BUN 15 8 - 27 mg/dL   Creatinine, Ser 6.21 0.57 - 1.00 mg/dL   eGFR 72 >30 QM/VHQ/4.69   BUN/Creatinine Ratio 18 12 - 28   Sodium 141 134 - 144 mmol/L   Potassium 4.0 3.5 - 5.2 mmol/L   Chloride 101 96 - 106 mmol/L   CO2 28 20 - 29 mmol/L   Calcium 10.8 (H) 8.7 - 10.3 mg/dL   Total Protein 7.4 6.0 - 8.5 g/dL   Albumin 4.1 3.7 - 4.7 g/dL   Globulin, Total 3.3 1.5 - 4.5 g/dL   Bilirubin Total 0.4 0.0 - 1.2 mg/dL   Alkaline Phosphatase 99 44 - 121 IU/L   AST 23 0 - 40 IU/L   ALT 30 0 - 32 IU/L  TSH     Status: None   Collection Time: 09/22/22  8:51 AM  Result Value Ref Range   TSH 2.680 0.450 - 4.500 uIU/mL  CBC with Differential/Platelet     Status: None   Collection Time: 09/22/22  8:51 AM  Result Value Ref Range   WBC 6.2 3.4 - 10.8 x10E3/uL   RBC 4.02 3.77 - 5.28 x10E6/uL   Hemoglobin  12.4 11.1 - 15.9 g/dL   Hematocrit 44.0 34.7 - 46.6 %   MCV 95 79 - 97 fL   MCH 30.8 26.6 - 33.0 pg   MCHC 32.4 31.5 - 35.7 g/dL   RDW 42.5 95.6 - 38.7 %   Platelets 201 150 - 450 x10E3/uL   Neutrophils 57 Not Estab. %   Lymphs 27 Not Estab. %   Monocytes 10 Not Estab. %   Eos 4 Not Estab. %   Basos 1 Not Estab. %   Neutrophils Absolute 3.5 1.4 - 7.0 x10E3/uL   Lymphocytes Absolute 1.7 0.7 - 3.1 x10E3/uL   Monocytes Absolute 0.6 0.1 - 0.9 x10E3/uL   EOS (ABSOLUTE) 0.2 0.0 - 0.4 x10E3/uL   Basophils Absolute 0.1 0.0 - 0.2 x10E3/uL   Immature Granulocytes 1 Not Estab. %   Immature Grans (Abs) 0.0 0.0 - 0.1 x10E3/uL  POCT Urinalysis Dipstick ([id number])     Status: Abnormal   Collection Time: 11/14/22 12:27 PM  Result  Value Ref Range   Color, UA     Clarity, UA     Glucose, UA Negative Negative   Bilirubin, UA Negative    Ketones, UA Negative    Spec Grav, UA 1.015 1.010 - 1.025   Blood, UA Negative    pH, UA 6.0 5.0 - 8.0   Protein, UA Positive (A) Negative   Urobilinogen, UA 0.2 0.2 or 1.0 E.U./dL   Nitrite, UA Negative    Leukocytes, UA Negative Negative   Appearance     Odor        Assessment & Plan:  Continue all medications.  Monitor weight and blood pressure.  Reschedule mammogram and DEXA scan. Problem List Items Addressed This Visit     NSTEMI (non-ST elevated myocardial infarction) (HCC)   Relevant Medications   BRILINTA 60 MG TABS tablet   Hypothyroid   Essential hypertension, benign - Primary   Rheumatoid arthritis (HCC)   Seasonal allergic rhinitis due to pollen   Mixed hyperlipidemia    Return in about 1 month (around 01/07/2023).   Total time spent: 30 minutes  Margaretann Loveless, MD  12/08/2022   This document may have been prepared by [facility] Voice Recognition software and as such may include unintentional dictation errors.

## 2022-12-26 DIAGNOSIS — Z79899 Other long term (current) drug therapy: Secondary | ICD-10-CM | POA: Diagnosis not present

## 2022-12-26 DIAGNOSIS — H35379 Puckering of macula, unspecified eye: Secondary | ICD-10-CM | POA: Diagnosis not present

## 2022-12-29 ENCOUNTER — Other Ambulatory Visit: Payer: Self-pay | Admitting: Cardiology

## 2023-01-04 ENCOUNTER — Ambulatory Visit
Admission: RE | Admit: 2023-01-04 | Discharge: 2023-01-04 | Disposition: A | Discharge status: Home / Self Care | Payer: Medicare Other | Source: Ambulatory Visit | Attending: Internal Medicine | Admitting: Internal Medicine

## 2023-01-04 DIAGNOSIS — Z1231 Encounter for screening mammogram for malignant neoplasm of breast: Secondary | ICD-10-CM | POA: Diagnosis not present

## 2023-01-11 DIAGNOSIS — M069 Rheumatoid arthritis, unspecified: Secondary | ICD-10-CM | POA: Diagnosis not present

## 2023-01-12 ENCOUNTER — Encounter: Payer: Self-pay | Admitting: Internal Medicine

## 2023-01-12 ENCOUNTER — Ambulatory Visit (INDEPENDENT_AMBULATORY_CARE_PROVIDER_SITE_OTHER): Payer: Medicare Other | Admitting: Internal Medicine

## 2023-01-12 VITALS — BP 102/74 | HR 65 | Ht 63.0 in | Wt 191.0 lb

## 2023-01-12 DIAGNOSIS — M05711 Rheumatoid arthritis with rheumatoid factor of right shoulder without organ or systems involvement: Secondary | ICD-10-CM

## 2023-01-12 DIAGNOSIS — Z0000 Encounter for general adult medical examination without abnormal findings: Secondary | ICD-10-CM

## 2023-01-12 DIAGNOSIS — E039 Hypothyroidism, unspecified: Secondary | ICD-10-CM

## 2023-01-12 DIAGNOSIS — I10 Essential (primary) hypertension: Secondary | ICD-10-CM

## 2023-01-12 DIAGNOSIS — E782 Mixed hyperlipidemia: Secondary | ICD-10-CM | POA: Diagnosis not present

## 2023-01-12 DIAGNOSIS — R109 Unspecified abdominal pain: Secondary | ICD-10-CM

## 2023-01-12 LAB — POCT URINALYSIS DIPSTICK
Bilirubin, UA: NEGATIVE
Blood, UA: NEGATIVE
Glucose, UA: NEGATIVE
Ketones, UA: NEGATIVE
Leukocytes, UA: NEGATIVE
Nitrite, UA: NEGATIVE
Protein, UA: NEGATIVE
Spec Grav, UA: 1.02 (ref 1.010–1.025)
Urobilinogen, UA: 0.2 U/dL
pH, UA: 7 (ref 5.0–8.0)

## 2023-01-12 MED ORDER — CELECOXIB 200 MG PO CAPS: 200.0000 mg | ORAL_CAPSULE | Freq: Every day | ORAL | 0 refills | Status: DC

## 2023-01-12 MED ORDER — BACLOFEN 10 MG PO TABS: 10.0000 mg | ORAL_TABLET | Freq: Every day | ORAL | 1 refills | Status: DC

## 2023-01-12 NOTE — Progress Notes (Signed)
Established Patient Office Visit  Subjective:  Patient ID: Kimberly Burke, female    DOB: Oct 22, 1940  Age: 82 y.o. MRN: [id number]  No chief complaint on file.   Patient comes in for her annual wellness visit.  She is generally feeling well but complains of mild right flank pain, especially when she moves her body around.  It is mostly postural without any tenderness.  There is no dysuria and no blood in urine.  Urine dipstick is negative as well.  Patient denies any cough, chest congestion, and there is no fevers or chills.  Will treat with Celebrex and a muscle relaxant baclofen. She is up-to-date on her mammogram.   DEXA is pending. Her PHQ-9/GAD score is 3/0. CFS is 0.     No other concerns at this time.   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 Oneita Jolly at [facility]    CORONARY STENT INTERVENTION N/A 12/04/2018   Procedure: CORONARY STENT INTERVENTION;  Surgeon: Alwyn Pea, MD;  Location: [facility] INV[facility]IVE CV [facility];  Service: Cardiovascular;  Laterality: N/A;   LEFT HEART CATH AND CORONARY ANGIOGRAPHY N/A 12/04/2018   Procedure: LEFT HEART CATH AND CORONARY ANGIOGRAPHY;  Surgeon: Laurier Nancy, MD;  Location: [facility] INVAS[facility] CV [facility];  Service: Cardiovascular;  Laterality: N/A;    Social History   Socioeconomic History   Marital status: Widowed    Spouse name: Not on file   Number of children: Not on file   Years of education: Not on file   Highest education level: Not on file  Occupational History   Not on file  Tobacco Use   Smoking status: Former   Smokeless tobacco: Never  Vaping  Use   Vaping status: Never Used  Substance and Sexual Activity   Alcohol use: Never   Drug use: Never   Sexual activity: Not Currently  Other Topics Concern   Not on file  Social History Narrative   Lives with daughter   Social Drivers of Health   Financial Resource Strain: Low Risk  (09/02/2021)   Overall Financial Resource Strain (CARDIA)    Difficulty of Paying Living Expenses: Not hard at all  Food Insecurity: No Food Insecurity (09/09/2021)   Hunger Vital Sign    Worried About Running Out of Food in the Last Year: Never true    Ran Out of Food in the Last Year: Never true  Transportation Needs: No Transportation Needs (09/09/2021)   PRAPARE - Administrator, Civil Service (Medical): No    Lack of Transportation (Non-Medical): No  Physical Activity: Insufficiently Active (09/02/2021)   Exercise Vital Sign    Days of Exercise per Week: 5 days    Minutes of Exercise per Session: 10 min  Stress: No Stress Concern Present (09/02/2021)   [employer] of Occupational Health - Occupational Stress Questionnaire    Feeling of Stress : Not at all  Social Connections: Moderately Integrated (09/02/2021)   Social Connection and Isolation Panel [NHANES]    Frequency of Communication with Friends and Family: More than three times a  week    Frequency of Social Gatherings with Friends and Family: More than three times a week    Attends Religious Services: More than 4 times per year    Active Member of Clubs or Organizations: Yes    Attends Banker Meetings: More than 4 times per year    Marital Status: Widowed  Catering manager Violence: Not on file    Family History  Problem Relation Age of Onset   Cancer Brother        prostate or colon unsure which    Breast cancer Neg Hx     Allergies  Allergen Reactions   Codeine Itching   Penicillins Other (See Comments)    unknown    Outpatient Medications Prior to Visit  Medication Sig   acetaminophen  (TYLENOL) 650 MG CR tablet Take 1,300 mg by mouth daily.   aspirin 81 MG chewable tablet Chew 1 tablet (81 mg total) by mouth daily.   BRILINTA 60 MG TABS tablet Take 1 tablet (60 mg total) by mouth 2 (two) times daily.   certolizumab pegol (CIMZIA) 2 X 200 MG/ML PSKT prefilled syringe Inject into the skin.   Cholecalciferol 1.25 MG (50000 UT) capsule Take by mouth.   DULoxetine (CYMBALTA) 30 MG capsule TAKE 1 CAPSULE BY MOUTH EVERY DAY FOR MOOD   fluticasone (FLONASE) 50 MCG/ACT nasal spray Place 1 spray into both nostrils daily.   folic acid (FOLVITE) 1 MG tablet Take 1 tablet (1 mg total) by mouth daily.   gabapentin (NEURONTIN) 100 MG capsule Take 100 mg by mouth 3 (three) times daily.   hydrochlorothiazide (HYDRODIURIL) 25 MG tablet Take 1 tablet (25 mg total) by mouth daily.   hydroxychloroquine (PLAQUENIL) 200 MG tablet Take by mouth.   levothyroxine (SYNTHROID) 50 MCG tablet TAKE 1 TABLET BY MOUTH EVERY DAY   losartan (COZAAR) 25 MG tablet Take 1 tablet (25 mg total) by mouth daily.   methotrexate (RHEUMATREX) 2.5 MG tablet Take 15 mg by mouth once a week.   metoprolol succinate (TOPROL-XL) 100 MG 24 hr tablet TAKE 1 TABLET BY MOUTH EVERY DAY   pantoprazole (PROTONIX) 40 MG tablet Take 1 tablet (40 mg total) by mouth daily.   rosuvastatin (CRESTOR) 40 MG tablet TAKE 1 TABLET BY MOUTH EVERY DAY   [DISCONTINUED] celecoxib (CELEBREX) 200 MG capsule Take 200 mg by mouth daily.   [DISCONTINUED] meloxicam (MOBIC) 15 MG tablet TAKE 1 TABLET BY MOUTH EVERY DAY   No facility-administered medications prior to visit.    Review of Systems  Constitutional: Negative.  Negative for chills, diaphoresis, fever, malaise/fatigue and weight loss.  HENT: Negative.  Negative for hearing loss, nosebleeds and sinus pain.   Eyes: Negative.   Respiratory: Negative.  Negative for cough, shortness of breath and stridor.   Cardiovascular: Negative.  Negative for chest pain, palpitations and leg swelling.   Gastrointestinal: Negative.  Negative for abdominal pain, blood in stool, constipation, diarrhea, heartburn, melena, nausea and vomiting.  Genitourinary:  Negative for dysuria, flank pain and frequency.  Musculoskeletal:  Positive for myalgias. Negative for back pain, falls, joint pain and neck pain.  Skin: Negative.   Neurological: Negative.  Negative for dizziness and headaches.  Endo/Heme/Allergies: Negative.   Psychiatric/Behavioral: Negative.  Negative for depression and suicidal ideas. The patient is not nervous/anxious.        Objective:   BP 102/74   Pulse 65   Ht 5\' 3"  (1.6 m)   Wt 191 lb (86.6 kg)  SpO2 94%   BMI 33.83 kg/m   Vitals:   01/12/23 1119  BP: 102/74  Pulse: 65  Height: 5\' 3"  (1.6 m)  Weight: 191 lb (86.6 kg)  SpO2: 94%  BMI (Calculated): 33.84    Physical Exam Vitals and nursing note reviewed.  Constitutional:      Appearance: Normal appearance.  HENT:     Head: Normocephalic and atraumatic.     Nose: Nose normal.     Mouth/Throat:     Mouth: Mucous membranes are moist.     Pharynx: Oropharynx is clear.  Eyes:     Conjunctiva/sclera: Conjunctivae normal.     Pupils: Pupils are equal, round, and reactive to light.  Cardiovascular:     Rate and Rhythm: Normal rate and regular rhythm.     Pulses: Normal pulses.     Heart sounds: Normal heart sounds. No murmur heard. Pulmonary:     Effort: Pulmonary effort is normal.     Breath sounds: Normal breath sounds. No wheezing.  Abdominal:     General: Bowel sounds are normal.     Palpations: Abdomen is soft. There is no mass.     Tenderness: There is no abdominal tenderness. There is no right CVA tenderness, left CVA tenderness, guarding or rebound.  Musculoskeletal:        General: Normal range of motion.     Cervical back: Normal range of motion.     Right lower leg: No edema.     Left lower leg: No edema.  Skin:    General: Skin is warm and dry.  Neurological:     General: No focal  deficit present.     Mental Status: She is alert and oriented to person, place, and time.  Psychiatric:        Mood and Affect: Mood normal.        Behavior: Behavior normal.      Results for orders placed or performed in visit on 01/12/23  POCT Urinalysis Dipstick (81002)  Result Value Ref Range   Color, UA Yellow    Clarity, UA Clear    Glucose, UA Negative Negative   Bilirubin, UA Negative    Ketones, UA Negative    Spec Grav, UA 1.020 1.010 - 1.025   Blood, UA Negative    pH, UA 7.0 5.0 - 8.0   Protein, UA Negative Negative   Urobilinogen, UA 0.2 0.2 or 1.0 E.U./dL   Nitrite, UA Negative    Leukocytes, UA Negative Negative   Appearance Clear    Odor NO     Recent Results (from the past 2160 hours)  POCT Urinalysis Dipstick (16109)     Status: Abnormal   Collection Time: 11/14/22 12:27 PM  Result Value Ref Range   Color, UA     Clarity, UA     Glucose, UA Negative Negative   Bilirubin, UA Negative    Ketones, UA Negative    Spec Grav, UA 1.015 1.010 - 1.025   Blood, UA Negative    pH, UA 6.0 5.0 - 8.0   Protein, UA Positive (A) Negative   Urobilinogen, UA 0.2 0.2 or 1.0 E.U./dL   Nitrite, UA Negative    Leukocytes, UA Negative Negative   Appearance     Odor    POCT Urinalysis Dipstick ([id number])     Status: None   Collection Time: 01/12/23 12:10 PM  Result Value Ref Range   Color, UA Yellow    Clarity, UA Clear  Glucose, UA Negative Negative   Bilirubin, UA Negative    Ketones, UA Negative    Spec Grav, UA 1.020 1.010 - 1.025   Blood, UA Negative    pH, UA 7.0 5.0 - 8.0   Protein, UA Negative Negative   Urobilinogen, UA 0.2 0.2 or 1.0 E.U./dL   Nitrite, UA Negative    Leukocytes, UA Negative Negative   Appearance Clear    Odor NO       Assessment & Plan:  Patient advised to continue taking her medications.  Trial of Celebrex and baclofen once a day for left upper back pain .  Patient advised to take this for 1 week and report if not  better. Problem List Items Addressed This Visit     Hypothyroid   Relevant Orders   TSH   Essential hypertension, benign   Relevant Orders   CMP14+EGFR   Rheumatoid arthritis (HCC)   Relevant Medications   celecoxib (CELEBREX) 200 MG capsule   baclofen (LIORESAL) 10 MG tablet   Medicare annual wellness visit, subsequent - Primary   Mixed hyperlipidemia   Relevant Orders   Lipid Panel w/o Chol/HDL Ratio   Flank pain   Relevant Medications   celecoxib (CELEBREX) 200 MG capsule   baclofen (LIORESAL) 10 MG tablet   Other Relevant Orders   POCT Urinalysis Dipstick (40981) (Completed)    Follow up 3 months.  Total time spent: 30 minutes  Margaretann Loveless, MD  01/12/2023   This document may have been prepared by [facility] Voice Recognition software and as such may include unintentional dictation errors.

## 2023-01-13 LAB — CMP14+EGFR
ALT: 17 [IU]/L (ref 0–32)
AST: 16 [IU]/L (ref 0–40)
Albumin: 4.1 g/dL (ref 3.7–4.7)
Alkaline Phosphatase: 96 [IU]/L (ref 44–121)
BUN/Creatinine Ratio: 19 (ref 12–28)
BUN: 17 mg/dL (ref 8–27)
Bilirubin Total: 0.4 mg/dL (ref 0.0–1.2)
CO2: 28 mmol/L (ref 20–29)
Calcium: 10.9 mg/dL — ABNORMAL HIGH (ref 8.7–10.3)
Chloride: 100 mmol/L (ref 96–106)
Creatinine, Ser: 0.88 mg/dL (ref 0.57–1.00)
Globulin, Total: 3.6 g/dL (ref 1.5–4.5)
Glucose: 85 mg/dL (ref 70–99)
Potassium: 4.1 mmol/L (ref 3.5–5.2)
Sodium: 138 mmol/L (ref 134–144)
Total Protein: 7.7 g/dL (ref 6.0–8.5)
eGFR: 66 mL/min/{1.73_m2} (ref >59)

## 2023-01-13 LAB — LIPID PANEL W/O CHOL/HDL RATIO
Cholesterol, Total: 84 mg/dL — ABNORMAL LOW (ref 100–199)
HDL: 36 mg/dL — ABNORMAL LOW (ref >39)
LDL Chol Calc (NIH): 32 mg/dL (ref 0–99)
Triglycerides: 75 mg/dL (ref 0–149)
VLDL Cholesterol Cal: 16 mg/dL (ref 5–40)

## 2023-01-13 LAB — TSH: TSH: 2.99 u[IU]/mL (ref 0.450–4.500)

## 2023-01-27 ENCOUNTER — Encounter: Payer: Self-pay | Admitting: Internal Medicine

## 2023-02-05 ENCOUNTER — Emergency Department: Payer: Medicare Other

## 2023-02-05 ENCOUNTER — Emergency Department
Admission: EM | Admit: 2023-02-05 | Discharge: 2023-02-05 | Disposition: A | Discharge status: Home / Self Care | Payer: Medicare Other | Attending: Emergency Medicine | Admitting: Emergency Medicine

## 2023-02-05 ENCOUNTER — Other Ambulatory Visit: Payer: Self-pay | Admitting: Cardiovascular Disease

## 2023-02-05 DIAGNOSIS — I214 Non-ST elevation (NSTEMI) myocardial infarction: Secondary | ICD-10-CM

## 2023-02-05 DIAGNOSIS — J9811 Atelectasis: Secondary | ICD-10-CM | POA: Diagnosis not present

## 2023-02-05 DIAGNOSIS — R0789 Other chest pain: Secondary | ICD-10-CM | POA: Diagnosis not present

## 2023-02-05 DIAGNOSIS — I10 Essential (primary) hypertension: Secondary | ICD-10-CM | POA: Insufficient documentation

## 2023-02-05 DIAGNOSIS — R0989 Other specified symptoms and signs involving the circulatory and respiratory systems: Secondary | ICD-10-CM | POA: Diagnosis not present

## 2023-02-05 DIAGNOSIS — U071 COVID-19: Secondary | ICD-10-CM | POA: Diagnosis not present

## 2023-02-05 DIAGNOSIS — R059 Cough, unspecified: Secondary | ICD-10-CM | POA: Diagnosis not present

## 2023-02-05 LAB — BASIC METABOLIC PANEL
Anion gap: 8 (ref 5–15)
BUN: 19 mg/dL (ref 8–23)
CO2: 24 mmol/L (ref 22–32)
Calcium: 10.2 mg/dL (ref 8.9–10.3)
Chloride: 98 mmol/L (ref 98–111)
Creatinine, Ser: 0.79 mg/dL (ref 0.44–1.00)
GFR, Estimated: >60 mL/min (ref >60)
Glucose, Bld: 106 mg/dL — ABNORMAL HIGH (ref 70–99)
Potassium: 3.4 mmol/L — ABNORMAL LOW (ref 3.5–5.1)
Sodium: 130 mmol/L — ABNORMAL LOW (ref 135–145)

## 2023-02-05 LAB — CBC
HCT: 36.3 % (ref 36.0–46.0)
Hemoglobin: 11.7 g/dL — ABNORMAL LOW (ref 12.0–15.0)
MCH: 31.7 pg (ref 26.0–34.0)
MCHC: 32.2 g/dL (ref 30.0–36.0)
MCV: 98.4 fL (ref 80.0–100.0)
Platelets: 213 10*3/uL (ref 150–400)
RBC: 3.69 MIL/uL — ABNORMAL LOW (ref 3.87–5.11)
RDW: 14.6 % (ref 11.5–15.5)
WBC: 6.4 10*3/uL (ref 4.0–10.5)
nRBC: 0 % (ref 0.0–0.2)

## 2023-02-05 LAB — TROPONIN I (HIGH SENSITIVITY): Troponin I (High Sensitivity): 8 ng/L (ref <18)

## 2023-02-05 LAB — RESP PANEL BY RT-PCR (RSV, FLU A&B, COVID)  RVPGX2
Influenza A by PCR: NEGATIVE
Influenza B by PCR: NEGATIVE
Resp Syncytial Virus by PCR: NEGATIVE
SARS Coronavirus 2 by RT PCR: POSITIVE — AB

## 2023-02-05 MED ORDER — ONDANSETRON 4 MG PO TBDP: 4.0000 mg | ORAL_TABLET | Freq: Three times a day (TID) | ORAL | 0 refills | Status: DC | PRN

## 2023-02-05 MED ORDER — MOLNUPIRAVIR 200 MG PO CAPS: 4.0000 | ORAL_CAPSULE | Freq: Two times a day (BID) | ORAL | 0 refills | Status: AC

## 2023-02-05 NOTE — ED Provider Notes (Signed)
 [facility] Provider Note    Event Date/Time   First MD Initiated Contact with Patient 02/05/23 1546     (approximate)   History   Chief Complaint: Cough and Headache   HPI  Kimberly Burke is a 83 y.o. female with a history of hypertension, NSTEMI, rheumatoid arthritis who comes ED complaining of cough congestion and central chest tightness that started last night, associated with headache and fatigue as well.  Does have some sick contacts.          Physical Exam   Triage Vital Signs: ED Triage Vitals  Encounter Vitals Group     BP 02/05/23 1317 116/77     Systolic BP Percentile --      Diastolic BP Percentile --      Pulse Rate 02/05/23 1317 96     Resp 02/05/23 1317 18     Temp 02/05/23 1317 99.6 F (37.6 C)     Temp Source 02/05/23 1317 Oral     SpO2 02/05/23 1317 94 %     Weight 02/05/23 1318 191 lb (86.6 kg)     Height 02/05/23 1318 5' 3 (1.6 m)     Head Circumference --      Peak Flow --      Pain Score 02/05/23 1318 7     Pain Loc --      Pain Education --      Exclude from Growth Chart --     Most recent vital signs: Vitals:   02/05/23 1317  BP: 116/77  Pulse: 96  Resp: 18  Temp: 99.6 F (37.6 C)  SpO2: 94%    General: Awake, no distress.  CV:  Good peripheral perfusion.  Regular rate and rhythm Resp:  Normal effort.  Clear to auscultation bilaterally Abd:  No distention.  Soft nontender Other:  Moist oral mucosa   ED Results / Procedures / Treatments   Labs (all labs ordered are listed, but only abnormal results are displayed) Labs Reviewed  RESP PANEL BY RT-PCR (RSV, FLU A&B, COVID)  RVPGX2 - Abnormal; Notable for the following components:      Result Value   SARS Coronavirus 2 by RT PCR POSITIVE (*)    All other components within normal limits  BASIC METABOLIC PANEL - Abnormal; Notable for the following components:   Sodium 130 (*)    Potassium 3.4 (*)    Glucose, Bld 106 (*)    All other components  within normal limits  CBC - Abnormal; Notable for the following components:   RBC 3.69 (*)    Hemoglobin 11.7 (*)    All other components within normal limits  TROPONIN I (HIGH SENSITIVITY)     EKG Interpreted by me Sinus rhythm rate of 96.  Normal axis.  First-degree AV block.  Poor R wave progression.  Normal ST segments and T waves.   RADIOLOGY Chest x-ray interpreted by me, appears normal.  Radiology report reviewed   PROCEDURES:  Procedures   MEDICATIONS ORDERED IN ED: Medications - No data to display   IMPRESSION / MDM / ASSESSMENT AND PLAN / ED COURSE  I reviewed the triage vital signs and the nursing notes.  DDx: Pneumonia, pleural effusion, COVID, influenza, non-STEMI, electrolyte derangement, AKI  Patient's presentation is most consistent with acute presentation with potential threat to life or bodily function.  Patient presents with influenza-like illness that started yesterday.  She is nontoxic, vital signs and exam are normal.  COVID test is  positive.  She has high risk features including age, overweight, hypertension, rheumatoid arthritis, will prescribe molnupiravir .       FINAL CLINICAL IMPRESSION(S) / ED DIAGNOSES   Final diagnoses:  COVID-19 virus infection     Rx / DC Orders   ED Discharge Orders          Ordered    ondansetron  (ZOFRAN -ODT) 4 MG disintegrating tablet  Every 8 hours PRN        02/05/23 1613    molnupiravir  EUA (LAGEVRIO ) 200 MG CAPS capsule  2 times daily        02/05/23 1613             Note:  This document was prepared using Dragon voice recognition software and may include unintentional dictation errors.   Viviann Pastor, MD 02/05/23 2300

## 2023-02-05 NOTE — ED Triage Notes (Signed)
 Pt c/o cough, congestion, and central chest tightness starting last night and headache and "zinging" in ears starting this morning.  Pain score 7/10.    Pt reports taking Tylenol around noon.    Pt reports sick contact at work.

## 2023-02-05 NOTE — ED Provider Triage Note (Signed)
 Emergency Medicine Provider Triage Evaluation Note  Kimberly Burke , a 83 y.o. female  was evaluated in triage.  Pt complains of cough, chest congestion, and central chest tightness that started last night.  Headache started this morning.  No relief with Tylenol ..   Physical Exam  BP 116/77 (BP Location: Right Arm)   Pulse 96   Temp 99.6 F (37.6 C) (Oral)   Resp 18   Ht 5' 3 (1.6 m)   Wt 86.6 kg   SpO2 94%   BMI 33.83 kg/m  Gen:   Awake, no distress   Resp:  Normal effort  MSK:   Moves extremities without difficulty  Other:    Medical Decision Making  Medically screening exam initiated at 1:20 PM.  Appropriate orders placed.  KAMORAH NEVILS was informed that the remainder of the evaluation will be completed by another provider, this initial triage assessment does not replace that evaluation, and the importance of remaining in the ED until their evaluation is complete.  Respiratory panel and cardiac labs sent.  EKG completed.   Herlinda Kirk NOVAK, FNP 02/05/23 1523

## 2023-02-06 ENCOUNTER — Telehealth: Payer: Self-pay | Admitting: Internal Medicine

## 2023-02-06 ENCOUNTER — Other Ambulatory Visit: Payer: Self-pay | Admitting: Internal Medicine

## 2023-02-06 NOTE — Telephone Encounter (Signed)
 Patient left VM that she went to get her medicine for Covid and it was going to be $300 and she cannot afford that.

## 2023-02-06 NOTE — Telephone Encounter (Signed)
Call entered in error

## 2023-02-09 ENCOUNTER — Other Ambulatory Visit: Payer: Self-pay | Admitting: Internal Medicine

## 2023-02-09 DIAGNOSIS — R109 Unspecified abdominal pain: Secondary | ICD-10-CM

## 2023-02-10 NOTE — Telephone Encounter (Signed)
LM for pt to call back.

## 2023-02-16 DIAGNOSIS — M069 Rheumatoid arthritis, unspecified: Secondary | ICD-10-CM | POA: Diagnosis not present

## 2023-03-08 ENCOUNTER — Inpatient Hospital Stay: Payer: Medicare Other | Attending: Obstetrics and Gynecology | Admitting: Nurse Practitioner

## 2023-03-08 ENCOUNTER — Other Ambulatory Visit: Payer: Self-pay | Admitting: Internal Medicine

## 2023-03-08 VITALS — BP 124/66 | HR 65 | Temp 98.7°F | Resp 19 | Wt 193.9 lb

## 2023-03-08 DIAGNOSIS — C541 Malignant neoplasm of endometrium: Secondary | ICD-10-CM | POA: Diagnosis present

## 2023-03-08 DIAGNOSIS — Z8542 Personal history of malignant neoplasm of other parts of uterus: Secondary | ICD-10-CM | POA: Diagnosis not present

## 2023-03-08 DIAGNOSIS — Z08 Encounter for follow-up examination after completed treatment for malignant neoplasm: Secondary | ICD-10-CM | POA: Diagnosis not present

## 2023-03-08 NOTE — Progress Notes (Signed)
Gynecologic Oncology Consult Visit   Referring Provider: Dr. Adelene Idler  Chief Concern: high grade endometrial cancer, surveillance  Subjective:  Kimberly Burke is a 83 y.o. G3P3 female with h/o STEMI s/p stent placement who is seen in consultation from Dr. Bjorn Pippin for high grade endometrial cancer s/p EUA, TLH-BSO with SLN mapping and dissection with Dr. Oneita Jolly at [facility] on 06/19/2020. She opted for active surveillance and returns for follow up.   She continues to deny vaginal discharge, bleeding or spotting. She denies pelvic pain or stool changes. She continues to have exacerbations of her rheumatoid arthritis and is followed by rheumatology. She had covid-19 infection in January and has mild cough.   Last imaging was 11/30/20- CT Abdomen pelvis Wo contrast.   01/04/23- mammogram- negative   Gynecologic Oncology History:  Kimberly Burke is a pleasant G3P3 female with h/o STEMI s/p stent placement who is seen in consultation from Dr. Bjorn Pippin for high grade endometrial cancer s/p EUA, TLH-BSO with SLN mapping and dissection for stage Ia disease.  She presented initially with postmenopausal bleeding and was seen by Dr. Bjorn Pippin for evaluation. As part of her evaluation with her primary care office a CT was obtained of the abdomen and pelvis. CT 05/22/2020 showed several findings:  There was a simple appearing cyst on the left lobe of the liver with simple with a maximum dimension of 4.3 cm.  This was not substantially changed from her prior exam.  There were multiple simple appearing right renal cyst primary in the mid and lower lobe the largest arising from the lower lobe measuring 3.8 cm.  2 other cyst in the right kidney were 2.5 to 3 cm in size.  There is no evidence of adrenal or renal mass or renal obstruction on the study obtained without IV contrast. Prominent appearance of the right ovary which contains a focus of calcification.  The right ovary measures approximately 4  cm. The endometrial stripe appears abnormally thickened for a postmenopausal patient measuring 1.7 cm. There is no fluid or free fluid noted in the pelvis.       05/27/2020 EMBx A. 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  Uterus sounded to 8 cm  06/19/20 Underwent total laparoscopic hysterectomy, bilateral salpingoophorectomy, bilateral sentinel lymph node biopsies, pelvic washings. 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  Mismatch repair: INTACT A. Immunohistochemistry: Normal result. Expression of MLH1, MSH2, MSH6, and PMS2 is retained. B. PCR: Microsatellite stable (MSS).     % of Cells Staining Intensity Score Interpretation  ER IHC 31 1+ POSITIVE  PR IHC <1 2+ NEGATIVE  HER2/neu IHC 40 1+ NEGATIVE    Cytology Scant cytologically bland cells present, suggestive of endometrial cells.  Comment:  There is no evidence of malignancy.  A single group of bland cells resembling endometrial epithelium is present.  The differential diagnosis includes endometriosis versus refluxed benign endometrium.  Clinical correlation with the corresponding surgical pathology specimen ([id number]) is recommended.    Recommendations Tumor Board at [facility] active surveillance; [facility] Tumor Board recommend VBT. Can discuss systemic chemotherapy.  NCCN guidelines reviewed. For stage 1A noninvasive options include VBT or observation if negative washings. Her washings did not definitively demonstrate malignancy. We reviewed recommendations from [facility]  and [facility] Tumor Board as well as NCCN guidelines. We reviewed pros and risks of additional therapy. Risk of recurrence ranges from 10-22%. With radiation the  recurrence rate would be reduced to ~5-7%. She would like to proceed with active surveillance at this time. She will contact me if she changes her mind and if so we will refer to Dr. Rushie Chestnut.   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   Rotator cuff tear arthropathy of right shoulder 02/24/2022   Encounter for follow-up surveillance of endometrial cancer 01/12/2022   Coronary artery disease involving native coronary artery of native heart 06/15/2020   Medicare annual wellness visit, subsequent 06/15/2020   Chronic kidney disease 06/03/2020   Venous stasis 06/03/2020   Endometrial cancer (HCC) 06/03/2020   NSTEMI (non-ST elevated myocardial infarction) (HCC) 12/03/2018   Hypothyroid 12/03/2018   Essential hypertension, benign 12/03/2018   Rheumatoid arthritis (HCC) 12/03/2018   Degenerative arthritis 08/14/2013    Past Medical History: Past Medical History:  Diagnosis Date   Arthritis    Endometrial cancer (HCC)    Hypertension    Hypothyroid    NSTEMI (non-ST elevated myocardial infarction) (HCC)    Rheumatoid arthritis (HCC)    Rotator cuff tear arthropathy of right shoulder 02/24/2022   Thyroid disease     Past Surgical History: Past Surgical History:  Procedure Laterality Date   ABDOMINAL HYSTERECTOMY  06/19/2020   EUA, TLH-BSO with SLN mapping and dissection with Dr. Oneita Jolly at [facility]    CORONARY STENT INTERVENTION N/A 12/04/2018   Procedure: CORONARY STENT INTERVENTION;  Surgeon: Alwyn Pea, MD;  Location: [facility] INVASIVE CV LAB;  Service: Cardiovascular;  Laterality: N/A;   LEFT HEART CATH AND CORONARY ANGIOGRAPHY N/A 12/04/2018   Procedure: LEFT HEART CATH AND CORONARY ANGIOGRAPHY;  Surgeon: Laurier Nancy, MD;  Location: [facility] INVASIVE CV LAB;  Service:  Cardiovascular;  Laterality: N/A;    Past Gynecologic History:  Menarche: 13 Menopause; 41 Last Pap unknown; no abnormal Pap   OB History: SVD x 3 OB History  Gravida Para Term Preterm AB Living  3 3      SAB IAB Ectopic Multiple Live Births          # Outcome Date GA Lbr Len/2nd Weight Sex Type Anes PTL Lv  3 Para           2 Para           1 Para             Family History: Family History  Problem Relation Age of Onset   Cancer Brother        prostate or colon unsure which    Breast cancer Neg Hx     Social History: Social History   Socioeconomic History   Marital status: Widowed    Spouse name: Not on file   Number of children: Not on file   Years of education: Not on file   Highest education level: Not on file  Occupational History   Not on file  Tobacco Use   Smoking status: Former   Smokeless tobacco: Never  Vaping Use   Vaping status: Never Used  Substance and Sexual Activity   Alcohol use: Never   Drug use: Never   Sexual activity: Not Currently  Other Topics Concern  Not on file  Social History Narrative   Lives with daughter   Social Drivers of Health   Financial Resource Strain: Low Risk  (09/02/2021)   Overall Financial Resource Strain (CARDIA)    Difficulty of Paying Living Expenses: Not hard at all  Food Insecurity: No Food Insecurity (09/09/2021)   Hunger Vital Sign    Worried About Running Out of Food in the Last Year: Never true    Ran Out of Food in the Last Year: Never true  Transportation Needs: No Transportation Needs (09/09/2021)   PRAPARE - Administrator, Civil Service (Medical): No    Lack of Transportation (Non-Medical): No  Physical Activity: Insufficiently Active (09/02/2021)   Exercise Vital Sign    Days of Exercise per Week: 5 days    Minutes of Exercise per Session: 10 min  Stress: No Stress Concern Present (09/02/2021)   [employer] of Occupational Health - Occupational Stress Questionnaire     Feeling of Stress : Not at all  Social Connections: Moderately Integrated (09/02/2021)   Social Connection and Isolation Panel [NHANES]    Frequency of Communication with Friends and Family: More than three times a week    Frequency of Social Gatherings with Friends and Family: More than three times a week    Attends Religious Services: More than 4 times per year    Active Member of [employer] or Organizations: Yes    Attends Banker Meetings: More than 4 times per year    Marital Status: Widowed  Intimate Partner Violence: Not on file    Allergies: Allergies  Allergen Reactions   Codeine Itching   Penicillins Other (See Comments)    unknown    Current Medications: Current Outpatient Medications  Medication Sig Dispense Refill   acetaminophen (TYLENOL) 650 MG CR tablet Take 1,300 mg by mouth daily.     aspirin 81 MG chewable tablet Chew 1 tablet (81 mg total) by mouth daily.     baclofen (LIORESAL) 10 MG tablet Take 1 tablet (10 mg total) by mouth daily. 30 tablet 1   BRILINTA 60 MG TABS tablet TAKE 1 TABLET(60 MG) BY MOUTH TWICE DAILY 60 tablet 3   celecoxib (CELEBREX) 200 MG capsule TAKE 1 CAPSULE(200 MG) BY MOUTH DAILY 30 capsule 0   certolizumab pegol (CIMZIA) 2 X 200 MG/ML PSKT prefilled syringe Inject into the skin.     Cholecalciferol 1.25 MG (50000 UT) capsule Take by mouth.     DULoxetine (CYMBALTA) 30 MG capsule TAKE 1 CAPSULE BY MOUTH EVERY DAY FOR MOOD 90 capsule 3   fluticasone (FLONASE) 50 MCG/ACT nasal spray Place 1 spray into both nostrils daily. 11.1 mL 6   folic acid (FOLVITE) 1 MG tablet Take 1 tablet (1 mg total) by mouth daily. 90 tablet 3   gabapentin (NEURONTIN) 100 MG capsule Take 100 mg by mouth 3 (three) times daily.     hydrochlorothiazide (HYDRODIURIL) 25 MG tablet Take 1 tablet (25 mg total) by mouth daily. 90 tablet 3   hydroxychloroquine (PLAQUENIL) 200 MG tablet Take by mouth.     levothyroxine (SYNTHROID) 50 MCG tablet TAKE 1 TABLET BY MOUTH  EVERY DAY 90 tablet 3   losartan (COZAAR) 25 MG tablet Take 1 tablet (25 mg total) by mouth daily. 90 tablet 3   methotrexate (RHEUMATREX) 2.5 MG tablet Take 15 mg by mouth once a week.     metoprolol succinate (TOPROL-XL) 100 MG 24 hr tablet TAKE 1 TABLET BY MOUTH EVERY  DAY 90 tablet 0   ondansetron (ZOFRAN-ODT) 4 MG disintegrating tablet Take 1 tablet (4 mg total) by mouth every 8 (eight) hours as needed for nausea or vomiting. 20 tablet 0   pantoprazole (PROTONIX) 40 MG tablet Take 1 tablet (40 mg total) by mouth daily. 90 tablet 3   rosuvastatin (CRESTOR) 40 MG tablet TAKE 1 TABLET BY MOUTH EVERY DAY 90 tablet 3   No current facility-administered medications for this visit.   Review of Systems General:  no complaints Skin: no complaints Eyes: no complaints HEENT: no complaints Breasts: no complaints Pulmonary: post-covid cough Cardiac: no complaints Gastrointestinal: no complaints Genitourinary/Sexual: no complaints Ob/Gyn: no complaints Musculoskeletal: arthritis-knees primarily Hematology: no complaints Neurologic/Psych: no complaints   Objective:  Physical Examination:  BP 124/66   Pulse 65   Temp 98.7 F (37.1 C)   Resp 19   Wt 193 lb 14.4 oz (88 kg)   SpO2 97%   BMI 34.35 kg/m    ECOG Performance Status: 1 - Symptomatic but completely ambulatory  GENERAL: Patient is a well appearing female in no acute distress HEENT:  Sclera clear. Anicteric NODES:  Negative axillary, supraclavicular, inguinal lymph node survery LUNGS:  Clear to auscultation bilaterally.   HEART:  Regular rate and rhythm.  ABDOMEN:  Soft, nontender. No hernias, incisions well healed. No masses or ascites. Upper abdominal wall laxity.  EXTREMITIES:  No peripheral edema. Atraumatic. No cyanosis SKIN:  Clear with no obvious rashes or skin changes.  NEURO:  Nonfocal. Well oriented.  Appropriate affect.  Pelvic: Exam chaperoned by CMA EGBUS: no lesions Cervix: surgically absent Vagina: no  lesions, no discharge or bleeding. Vaginal cuff well healed  Uterus: surgically absent BME: no palpable masses Rectal: deferred  Lab Review N/A  Radiologic Imaging: No imaging on site today.  Assessment:  Kimberly Burke is a 83 y.o. female diagnosed with high grade endometrial cancer (pMMR, MSS, ER 1+; PR negative; Her2neu negative), stage IA noninvasive endometrial cancer with washings that did not definitively demonstrate malignancy 06/19/20. Discussed VBT vs surveillance post op and patient elected for surveillance. CT scan 11/22 was negative for radiographic evidence of disease.  Clinically, asymptomatic. NED on exam.  Possible abdominal hernia versus laxity- imaging in November 2022 revealed small fat containing right lateral abdominal wall hernia, asymptomatic  Back pain & knee pain, suspect due to arthritis   Right ovarian cyst with calcification  Hepatic and renal cysts  Bone density scan 7/23 shows osteopenia.  Follows with Rheumatology for RA.   Medical co-morbidities complicating care: HTN, h/o STEMI 2020 s/p stent placement (Dr. Welton Flakes is her cardiologist), RA (Dr. Allena Katz), hypothyroidism.  Plan:   Problem List Items Addressed This Visit       Other   Encounter for follow-up surveillance of endometrial cancer - Primary   We again reviewed NCCN surveillance guidelines including continued surveillance with pelvic exam every 6 months. We reviewed symptoms that might be concerning for recurrent disease and warrant sooner return to clinic.   Not currently sexually active and denies sexual side effects of treatment.   The patient's diagnosis, an outline of the further diagnostic and laboratory studies which will be required, the recommendation, and alternatives were discussed.  All questions were answered to the patient's satisfaction.  Alinda Dooms, NP

## 2023-03-09 MED ORDER — LEVOTHYROXINE SODIUM 50 MCG PO TABS: 50.0000 ug | ORAL_TABLET | Freq: Every day | ORAL | 3 refills | Status: AC

## 2023-03-20 DIAGNOSIS — M069 Rheumatoid arthritis, unspecified: Secondary | ICD-10-CM | POA: Diagnosis not present

## 2023-03-20 DIAGNOSIS — M0579 Rheumatoid arthritis with rheumatoid factor of multiple sites without organ or systems involvement: Secondary | ICD-10-CM | POA: Diagnosis not present

## 2023-03-20 DIAGNOSIS — M5416 Radiculopathy, lumbar region: Secondary | ICD-10-CM | POA: Diagnosis not present

## 2023-03-20 DIAGNOSIS — Z79899 Other long term (current) drug therapy: Secondary | ICD-10-CM | POA: Diagnosis not present

## 2023-04-05 ENCOUNTER — Other Ambulatory Visit: Payer: Self-pay | Admitting: Cardiovascular Disease

## 2023-04-05 DIAGNOSIS — I2510 Atherosclerotic heart disease of native coronary artery without angina pectoris: Secondary | ICD-10-CM

## 2023-04-13 ENCOUNTER — Encounter: Payer: Self-pay | Admitting: Internal Medicine

## 2023-04-13 ENCOUNTER — Ambulatory Visit: Payer: Medicare Other | Admitting: Internal Medicine

## 2023-04-13 VITALS — BP 112/68 | HR 71 | Ht 63.0 in | Wt 196.6 lb

## 2023-04-13 DIAGNOSIS — E559 Vitamin D deficiency, unspecified: Secondary | ICD-10-CM

## 2023-04-13 DIAGNOSIS — E039 Hypothyroidism, unspecified: Secondary | ICD-10-CM | POA: Diagnosis not present

## 2023-04-13 DIAGNOSIS — I10 Essential (primary) hypertension: Secondary | ICD-10-CM

## 2023-04-13 DIAGNOSIS — M05711 Rheumatoid arthritis with rheumatoid factor of right shoulder without organ or systems involvement: Secondary | ICD-10-CM

## 2023-04-13 DIAGNOSIS — J301 Allergic rhinitis due to pollen: Secondary | ICD-10-CM

## 2023-04-13 DIAGNOSIS — R109 Unspecified abdominal pain: Secondary | ICD-10-CM

## 2023-04-13 DIAGNOSIS — E782 Mixed hyperlipidemia: Secondary | ICD-10-CM

## 2023-04-13 MED ORDER — FLUTICASONE PROPIONATE 50 MCG/ACT NA SUSP: 1.0000 | Freq: Every day | NASAL | 6 refills | Status: DC

## 2023-04-13 MED ORDER — CELECOXIB 200 MG PO CAPS: 200.0000 mg | ORAL_CAPSULE | Freq: Every day | ORAL | 3 refills | Status: DC

## 2023-04-13 NOTE — Progress Notes (Signed)
 Established Patient Office Visit  Subjective:  Patient ID: FLARA STORTI, female    DOB: 17-Apr-1940  Age: 83 y.o. MRN: [id number]  Chief Complaint  Patient presents with   Follow-up    3 month follow up    Patient comes in for her follow-up today.  She is upset as she had to pay a higher amount for her prescription medications as some paperwork was not completed.  The office did not receive the paperwork from her insurance company however will get it sorted out.  Otherwise patient's feels well and has no new complaints.  She is taking all her medications as prescribed.  Fasting for blood work today.    No other concerns at this time.   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 Oneita Jolly at [facility]    CORONARY STENT INTERVENTION N/A 12/04/2018   Procedure: CORONARY STENT INTERVENTION;  Surgeon: Alwyn Pea, MD;  Location: [facility] INVASIVE CV LAB;  Service: Cardiovascular;  Laterality: N/A;   LEFT HEART CATH AND CORONARY ANGIOGRAPHY N/A 12/04/2018   Procedure: LEFT HEART CATH AND CORONARY ANGIOGRAPHY;  Surgeon: Laurier Nancy, MD;  Location: [facility] INVASIVE CV LAB;  Service: Cardiovascular;  Laterality: N/A;    Social History   Socioeconomic History   Marital status: Widowed    Spouse name: Not on file   Number of children: Not on file   Years of education: Not on file   Highest education level: Not on file  Occupational History   Not on file  Tobacco Use   Smoking status: Former   Smokeless tobacco: Never  Vaping Use   Vaping status: Never Used  Substance and Sexual Activity   Alcohol  use: Never   Drug use: Never   Sexual activity: Not Currently  Other Topics Concern   Not on file  Social History Narrative   Lives with daughter   Social Drivers of Health   Financial Resource Strain: Low Risk  (09/02/2021)   Overall Financial Resource Strain (CARDIA)    Difficulty of Paying Living Expenses: Not hard at all  Food Insecurity: No Food Insecurity (09/09/2021)   Hunger Vital Sign    Worried About Running Out of Food in the Last Year: Never true    Ran Out of Food in the Last Year: Never true  Transportation Needs: No Transportation Needs (09/09/2021)   PRAPARE - Administrator, Civil Service (Medical): No    Lack of Transportation (Non-Medical): No  Physical Activity: Insufficiently Active (09/02/2021)   Exercise Vital Sign    Days of Exercise per Week: 5 days    Minutes of Exercise per Session: 10 min  Stress: No Stress Concern Present (09/02/2021)   [employer] of Occupational Health - Occupational Stress Questionnaire    Feeling of Stress : Not at all  Social Connections: Moderately Integrated (09/02/2021)   Social Connection and Isolation Panel [NHANES]    Frequency of Communication with Friends and Family: More than three times a week    Frequency of Social Gatherings with Friends and Family: More  than three times a week    Attends Religious Services: More than 4 times per year    Active Member of Clubs or Organizations: Yes    Attends Banker Meetings: More than 4 times per year    Marital Status: Widowed  Catering manager Violence: Not on file    Family History  Problem Relation Age of Onset   Cancer Brother        prostate or colon unsure which    Breast cancer Neg Hx     Allergies  Allergen Reactions   Codeine Itching   Penicillins Other (See Comments)    unknown    Outpatient Medications Prior to Visit  Medication Sig   acetaminophen (TYLENOL) 650 MG CR tablet Take 1,300 mg by mouth daily.   aspirin 81 MG chewable  tablet Chew 1 tablet (81 mg total) by mouth daily.   BRILINTA 60 MG TABS tablet TAKE 1 TABLET(60 MG) BY MOUTH TWICE DAILY   certolizumab pegol (CIMZIA) 2 X 200 MG/ML PSKT prefilled syringe Inject into the skin.   DULoxetine (CYMBALTA) 30 MG capsule TAKE 1 CAPSULE BY MOUTH EVERY DAY FOR MOOD   folic acid (FOLVITE) 1 MG tablet Take 1 tablet (1 mg total) by mouth daily.   gabapentin (NEURONTIN) 100 MG capsule Take 100 mg by mouth 3 (three) times daily.   hydrochlorothiazide (HYDRODIURIL) 25 MG tablet Take 1 tablet (25 mg total) by mouth daily.   hydroxychloroquine (PLAQUENIL) 200 MG tablet Take by mouth.   levothyroxine (SYNTHROID) 50 MCG tablet Take 1 tablet (50 mcg total) by mouth daily.   losartan (COZAAR) 25 MG tablet Take 1 tablet (25 mg total) by mouth daily.   methotrexate (RHEUMATREX) 2.5 MG tablet Take 15 mg by mouth once a week.   metoprolol succinate (TOPROL-XL) 100 MG 24 hr tablet TAKE 1 TABLET BY MOUTH EVERY DAY   ondansetron (ZOFRAN-ODT) 4 MG disintegrating tablet Take 1 tablet (4 mg total) by mouth every 8 (eight) hours as needed for nausea or vomiting.   rosuvastatin (CRESTOR) 40 MG tablet TAKE 1 TABLET BY MOUTH EVERY DAY   [DISCONTINUED] celecoxib (CELEBREX) 200 MG capsule TAKE 1 CAPSULE(200 MG) BY MOUTH DAILY   [DISCONTINUED] fluticasone (FLONASE) 50 MCG/ACT nasal spray Place 1 spray into both nostrils daily.   baclofen (LIORESAL) 10 MG tablet Take 1 tablet (10 mg total) by mouth daily. (Patient not taking: Reported on 04/13/2023)   Cholecalciferol 1.25 MG (50000 UT) capsule Take by mouth. (Patient not taking: Reported on 04/13/2023)   pantoprazole (PROTONIX) 40 MG tablet Take 1 tablet (40 mg total) by mouth daily. (Patient not taking: Reported on 04/13/2023)   No facility-administered medications prior to visit.    Review of Systems  Constitutional: Negative.  Negative for chills and fever.  HENT: Negative.    Eyes: Negative.   Respiratory: Negative.  Negative for cough and  shortness of breath.   Cardiovascular: Negative.  Negative for chest pain, palpitations and leg swelling.  Gastrointestinal: Negative.  Negative for abdominal pain, constipation, diarrhea, heartburn, nausea and vomiting.  Genitourinary: Negative.  Negative for dysuria and flank pain.  Musculoskeletal: Negative.  Negative for joint pain and myalgias.  Skin: Negative.   Neurological: Negative.  Negative for dizziness and headaches.  Endo/Heme/Allergies: Negative.   Psychiatric/Behavioral: Negative.  Negative for depression and suicidal ideas. The patient is not nervous/anxious.        Objective:   BP 112/68   Pulse 71   Ht 5\' 3"  (1.6 m)  Wt 196 lb 9.6 oz (89.2 kg)   SpO2 95%   BMI 34.83 kg/m   Vitals:   04/13/23 1017  BP: 112/68  Pulse: 71  Height: 5\' 3"  (1.6 m)  Weight: 196 lb 9.6 oz (89.2 kg)  SpO2: 95%  BMI (Calculated): 34.83    Physical Exam   No results found for any visits on 04/13/23.  Recent Results (from the past 2160 hours)  Resp panel by RT-PCR (RSV, Flu A&B, Covid) Anterior Nasal Swab     Status: Abnormal   Collection Time: 02/05/23  1:22 PM   Specimen: Anterior Nasal Swab  Result Value Ref Range   SARS Coronavirus 2 by RT PCR POSITIVE (A) NEGATIVE    Comment: (NOTE) SARS-CoV-2 target nucleic acids are DETECTED.  The SARS-CoV-2 RNA is generally detectable in upper respiratory specimens during the acute phase of infection. Positive results are indicative of the presence of the identified virus, but do not rule out bacterial infection or co-infection with other pathogens not detected by the test. Clinical correlation with patient history and other diagnostic information is necessary to determine patient infection status. The expected result is Negative.  Fact Sheet for Patients: BloggerCourse.com  Fact Sheet for Healthcare Providers: SeriousBroker.it  This test is not yet approved or cleared by the  [place] FDA and  has been authorized for detection and/or diagnosis of SARS-CoV-2 by FDA under an Emergency Use Authorization (EUA).  This EUA will remain in effect (meaning this test can be used) for the duration of  the COVID-19 declaration under Section 564(b)(1) of the A ct, 21 U.S.C. section 360bbb-3(b)(1), unless the authorization is terminated or revoked sooner.     Influenza A by PCR NEGATIVE NEGATIVE   Influenza B by PCR NEGATIVE NEGATIVE    Comment: (NOTE) The Xpert Xpress SARS-CoV-2/FLU/RSV plus assay is intended as an aid in the diagnosis of influenza from Nasopharyngeal swab specimens and should not be used as a sole basis for treatment. Nasal washings and aspirates are unacceptable for Xpert Xpress SARS-CoV-2/FLU/RSV testing.  Fact Sheet for Patients: BloggerCourse.com  Fact Sheet for Healthcare Providers: SeriousBroker.it  This test is not yet approved or cleared by the [place] FDA and has been authorized for detection and/or diagnosis of SARS-CoV-2 by FDA under an Emergency Use Authorization (EUA). This EUA will remain in effect (meaning this test can be used) for the duration of the COVID-19 declaration under Section 564(b)(1) of the Act, 21 U.S.C. section 360bbb-3(b)(1), unless the authorization is terminated or revoked.     Resp Syncytial Virus by PCR NEGATIVE NEGATIVE    Comment: (NOTE) Fact Sheet for Patients: BloggerCourse.com  Fact Sheet for Healthcare Providers: SeriousBroker.it  This test is not yet approved or cleared by the [place] FDA and has been authorized for detection and/or diagnosis of SARS-CoV-2 by FDA under an Emergency Use Authorization (EUA). This EUA will remain in effect (meaning this test can be used) for the duration of the COVID-19 declaration under Section 564(b)(1) of the Act, 21 U.S.C. section 360bbb-3(b)(1),  unless the authorization is terminated or revoked.  Performed at [facility], [address] [place], [place] [address]   Basic metabolic panel     Status: Abnormal   Collection Time: 02/05/23  1:39 PM  Result Value Ref Range   Sodium 130 (L) 135 - 145 mmol/L   Potassium 3.4 (L) 3.5 - 5.1 mmol/L   Chloride 98 98 - 111 mmol/L   CO2 24 22 - 32 mmol/L  Glucose, Bld 106 (H) 70 - 99 mg/dL    Comment: Glucose reference range applies only to samples taken after fasting for at least 8 hours.   BUN 19 8 - 23 mg/dL   Creatinine, Ser 1.61 0.44 - 1.00 mg/dL   Calcium 09.6 8.9 - 04.5 mg/dL   GFR, Estimated >40 >98 mL/min    Comment: (NOTE) Calculated using the CKD-EPI Creatinine Equation (2021)    Anion gap 8 5 - 15    Comment: Performed at [facility], [address] [place], [place] [address]  CBC     Status: Abnormal   Collection Time: 02/05/23  1:39 PM  Result Value Ref Range   WBC 6.4 4.0 - 10.5 K/uL   RBC 3.69 (L) 3.87 - 5.11 MIL/uL   Hemoglobin 11.7 (L) 12.0 - 15.0 g/dL   HCT 78.2 95.6 - 21.3 %   MCV 98.4 80.0 - 100.0 fL   MCH 31.7 26.0 - 34.0 pg   MCHC 32.2 30.0 - 36.0 g/dL   RDW 08.6 57.8 - 46.9 %   Platelets 213 150 - 400 K/uL   nRBC 0.0 0.0 - 0.2 %    Comment: Performed at [facility], [address], [place], [place] [address]  Troponin I (High Sensitivity)     Status: None   Collection Time: 02/05/23  1:39 PM  Result Value Ref Range   Troponin I (High Sensitivity) 8 <18 ng/L    Comment: (NOTE) Elevated high sensitivity troponin I (hsTnI) values and significant  changes across serial measurements may suggest ACS but many other  chronic and acute conditions are known to elevate hsTnI results.  Refer to the "Links" section for chest pain algorithms and additional  guidance. Performed at [facility], [address] [place], [place] [address]       Assessment & Plan:  Continue current medications.  Check blood work  today. Problem List Items Addressed This Visit     Hypothyroid   Relevant Orders   TSH+T4F+T3Free   Essential hypertension, benign   Relevant Orders   CMP14+EGFR   Rheumatoid arthritis (HCC)   Relevant Medications   celecoxib (CELEBREX) 200 MG capsule   Other Relevant Orders   CBC with Diff   Seasonal allergic rhinitis due to pollen   Relevant Medications   fluticasone (FLONASE) 50 MCG/ACT nasal spray   Mixed hyperlipidemia   Flank pain   Relevant Medications   celecoxib (CELEBREX) 200 MG capsule   Other Visit Diagnoses       Vitamin D deficiency    -  Primary   Relevant Orders   Vitamin D (25 hydroxy)       Return in about 3 months (around 07/14/2023).   Total time spent: 30 minutes  Margaretann Loveless, MD  04/13/2023   This document may have been prepared by [facility] Voice Recognition software and as such may include unintentional dictation errors.

## 2023-04-14 LAB — CMP14+EGFR
ALT: 22 IU/L (ref 0–32)
AST: 23 IU/L (ref 0–40)
Albumin: 4.1 g/dL (ref 3.7–4.7)
Alkaline Phosphatase: 92 IU/L (ref 44–121)
BUN/Creatinine Ratio: 18 (ref 12–28)
BUN: 16 mg/dL (ref 8–27)
Bilirubin Total: 0.4 mg/dL (ref 0.0–1.2)
CO2: 29 mmol/L (ref 20–29)
Calcium: 10.9 mg/dL — ABNORMAL HIGH (ref 8.7–10.3)
Chloride: 102 mmol/L (ref 96–106)
Creatinine, Ser: 0.9 mg/dL (ref 0.57–1.00)
Globulin, Total: 3.6 g/dL (ref 1.5–4.5)
Glucose: 80 mg/dL (ref 70–99)
Potassium: 4 mmol/L (ref 3.5–5.2)
Sodium: 141 mmol/L (ref 134–144)
Total Protein: 7.7 g/dL (ref 6.0–8.5)
eGFR: 64 mL/min/{1.73_m2} (ref >59)

## 2023-04-14 LAB — CBC WITH DIFFERENTIAL/PLATELET
Basophils Absolute: 0.1 10*3/uL (ref 0.0–0.2)
Basos: 1 %
EOS (ABSOLUTE): 0.3 10*3/uL (ref 0.0–0.4)
Eos: 4 %
Hematocrit: 35.4 % (ref 34.0–46.6)
Hemoglobin: 11.5 g/dL (ref 11.1–15.9)
Immature Grans (Abs): 0 10*3/uL (ref 0.0–0.1)
Immature Granulocytes: 1 %
Lymphocytes Absolute: 1.6 10*3/uL (ref 0.7–3.1)
Lymphs: 25 %
MCH: 32 pg (ref 26.6–33.0)
MCHC: 32.5 g/dL (ref 31.5–35.7)
MCV: 99 fL — ABNORMAL HIGH (ref 79–97)
Monocytes Absolute: 0.7 10*3/uL (ref 0.1–0.9)
Monocytes: 11 %
Neutrophils Absolute: 3.7 10*3/uL (ref 1.4–7.0)
Neutrophils: 58 %
Platelets: 202 10*3/uL (ref 150–450)
RBC: 3.59 x10E6/uL — ABNORMAL LOW (ref 3.77–5.28)
RDW: 14.4 % (ref 11.7–15.4)
WBC: 6.4 10*3/uL (ref 3.4–10.8)

## 2023-04-14 LAB — TSH+T4F+T3FREE
Free T4: 1.25 ng/dL (ref 0.82–1.77)
T3, Free: 3.1 pg/mL (ref 2.0–4.4)
TSH: 2.5 u[IU]/mL (ref 0.450–4.500)

## 2023-04-14 LAB — VITAMIN D 25 HYDROXY (VIT D DEFICIENCY, FRACTURES): Vit D, 25-Hydroxy: 23.7 ng/mL — ABNORMAL LOW (ref 30.0–100.0)

## 2023-04-19 ENCOUNTER — Other Ambulatory Visit: Payer: Self-pay | Admitting: Cardiovascular Disease

## 2023-04-20 ENCOUNTER — Telehealth: Payer: Self-pay

## 2023-04-20 NOTE — Telephone Encounter (Signed)
 Pt LM  stating she was returning a call, did not state for what or to who. I didn't see anything in the chart so, will reach back out to the patient and find out what the call was for

## 2023-04-24 ENCOUNTER — Institutional Professional Consult (permissible substitution): Admitting: Radiation Oncology

## 2023-05-04 ENCOUNTER — Telehealth: Payer: Self-pay | Admitting: *Deleted

## 2023-05-04 ENCOUNTER — Other Ambulatory Visit: Payer: Self-pay | Admitting: Nurse Practitioner

## 2023-05-04 DIAGNOSIS — C541 Malignant neoplasm of endometrium: Secondary | ICD-10-CM

## 2023-05-04 DIAGNOSIS — N950 Postmenopausal bleeding: Secondary | ICD-10-CM

## 2023-05-04 NOTE — Progress Notes (Signed)
 Patient reporting spotting. Recommend imaging and pelvic exam. Cordelia Pen will notify patient and coordinate scheduling.

## 2023-05-04 NOTE — Telephone Encounter (Signed)
 I called the DRi and spoke to a lady and she went through all the questions and asked about insurance [employer]. She says that the only spot mondays  is 3pm. She would need to get 1 bottle contrast at 1 pm and then 2 pm next bottle and she will need to not eat or drink for 4 hours prior to the scan, I had to leave a message and asked her to call me back.

## 2023-05-04 NOTE — Telephone Encounter (Signed)
 I called the pt and let her know that Kimberly Burke  says she needs CT scan and if she can get it next Monday and if that can go through then she can be seen on wed next week . We will let the pt.know if this will work. She understands

## 2023-05-08 ENCOUNTER — Ambulatory Visit
Admission: RE | Admit: 2023-05-08 | Discharge: 2023-05-08 | Disposition: A | Discharge status: Home / Self Care | Source: Ambulatory Visit | Attending: Nurse Practitioner | Admitting: Nurse Practitioner

## 2023-05-08 DIAGNOSIS — C541 Malignant neoplasm of endometrium: Secondary | ICD-10-CM

## 2023-05-08 DIAGNOSIS — N950 Postmenopausal bleeding: Secondary | ICD-10-CM

## 2023-05-08 MED ORDER — IOPAMIDOL (ISOVUE-300) INJECTION 61%
100.0000 mL | Freq: Once | INTRAVENOUS | Status: AC | PRN
  Administered 2023-05-08: 100 mL via INTRAVENOUS

## 2023-05-09 ENCOUNTER — Telehealth: Payer: Self-pay | Admitting: *Deleted

## 2023-05-09 NOTE — Telephone Encounter (Signed)
 I called to see if pt . Can get appt for wed with gyn. She got the scan done and the pt. Can come here tom. At 11 am. Pt is good for her . Lauren is good with that

## 2023-05-10 ENCOUNTER — Other Ambulatory Visit: Payer: Self-pay | Admitting: Nurse Practitioner

## 2023-05-10 ENCOUNTER — Inpatient Hospital Stay: Attending: Obstetrics and Gynecology | Admitting: Obstetrics and Gynecology

## 2023-05-10 VITALS — BP 119/76 | HR 81 | Temp 97.8°F | Resp 17 | Wt 194.7 lb

## 2023-05-10 DIAGNOSIS — Z9079 Acquired absence of other genital organ(s): Secondary | ICD-10-CM | POA: Insufficient documentation

## 2023-05-10 DIAGNOSIS — N950 Postmenopausal bleeding: Secondary | ICD-10-CM

## 2023-05-10 DIAGNOSIS — C541 Malignant neoplasm of endometrium: Secondary | ICD-10-CM | POA: Diagnosis not present

## 2023-05-10 DIAGNOSIS — Z90710 Acquired absence of both cervix and uterus: Secondary | ICD-10-CM | POA: Diagnosis not present

## 2023-05-10 DIAGNOSIS — N898 Other specified noninflammatory disorders of vagina: Secondary | ICD-10-CM

## 2023-05-10 DIAGNOSIS — Z90722 Acquired absence of ovaries, bilateral: Secondary | ICD-10-CM | POA: Diagnosis not present

## 2023-05-10 NOTE — Progress Notes (Signed)
 Gynecologic Oncology Interval Visit   Referring Provider: Dr. Gregor Learned  Chief Concern: high grade endometrial cancer  Subjective:  Kimberly Burke is a 83 y.o. G3P3 female with h/o STEMI s/p stent placement who is seen in consultation from Dr. Alda Amas for high grade endometrial cancer s/p EUA, TLH-BSO with SLN mapping and dissection with Dr. Comer Decamp at [facility] on 06/19/2020. She opted for active surveillance and returns for follow up.   She reported spotting which prompted a CT scan.   05/08/2023 CT C/A/P IMPRESSION: 1. No metastatic disease identified within the chest, abdomen or pelvis. 2. Multiple other nonacute observations, as described above.   Follows with Rheumatology for RA.     Gynecologic Oncology History:  Kimberly Burke is a pleasant G3P3 female with h/o STEMI s/p stent placement who is seen in consultation from Dr. Alda Amas for high grade endometrial cancer s/p EUA, TLH-BSO with SLN mapping and dissection for stage Ia disease.  She presented initially with postmenopausal bleeding and was seen by Dr. Alda Amas for evaluation. As part of her evaluation with her primary care office a CT was obtained of the abdomen and pelvis. CT 05/22/2020 showed several findings:  There was a simple appearing cyst on the left lobe of the liver with simple with a maximum dimension of 4.3 cm.  This was not substantially changed from her prior exam.  There were multiple simple appearing right renal cyst primary in the mid and lower lobe the largest arising from the lower lobe measuring 3.8 cm.  2 other cyst in the right kidney were 2.5 to 3 cm in size.  There is no evidence of adrenal or renal mass or renal obstruction on the study obtained without IV contrast. Prominent appearance of the right ovary which contains a focus of calcification.  The right ovary measures approximately 4 cm. The endometrial stripe appears abnormally thickened for a postmenopausal patient measuring 1.7 cm. There is  no fluid or free fluid noted in the pelvis.       05/27/2020 EMBx A. 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  Uterus sounded to 8 cm  06/19/20 Underwent total laparoscopic hysterectomy, bilateral salpingoophorectomy, bilateral sentinel lymph node biopsies, pelvic washings. 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  Mismatch repair: INTACT A. Immunohistochemistry: Normal result. Expression of MLH1, MSH2, MSH6, and PMS2 is retained. B. PCR: Microsatellite stable (MSS).     % of Cells Staining Intensity Score Interpretation  ER IHC 31 1+ POSITIVE  PR IHC <1 2+ NEGATIVE  HER2/neu IHC 40 1+ NEGATIVE   P53 Positive (mutant pattern)   Cytology Scant cytologically bland cells present, suggestive of endometrial cells.  Comment:  There is no evidence of malignancy.  A single group of bland cells resembling endometrial epithelium is present.  The differential diagnosis includes endometriosis versus refluxed benign endometrium.  Clinical correlation with the corresponding surgical pathology specimen ([id number]) is recommended.    Recommendations Tumor Board at [facility] active surveillance; [facility] Tumor Board recommend VBT. Can discuss systemic chemotherapy.  NCCN guidelines reviewed. For stage 1A noninvasive options include VBT or observation if negative washings. Her washings did not definitively demonstrate malignancy. We reviewed recommendations from [facility] and [facility] Tumor Board as well as NCCN guidelines. We  reviewed pros and risks of additional therapy. Risk of recurrence ranges from 10-22%. With radiation the recurrence rate would be reduced to ~5-7%. She would like to proceed with active  surveillance at this time. She will contact me if she changes her mind and if so we will refer to Dr. Rushie Chestnut.   05/10/2023 She presented with vaginal spotting which prompted a CT scan and exam.   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   Rotator cuff tear arthropathy of right shoulder 02/24/2022   Encounter for follow-up surveillance of endometrial cancer 01/12/2022   Coronary artery disease involving native coronary artery of native heart 06/15/2020   Medicare annual wellness visit, subsequent 06/15/2020   Chronic kidney disease 06/03/2020   Venous stasis 06/03/2020   Endometrial cancer (HCC) 06/03/2020   NSTEMI (non-ST elevated myocardial infarction) (HCC) 12/03/2018   Hypothyroid 12/03/2018   Essential hypertension, benign 12/03/2018   Rheumatoid arthritis (HCC) 12/03/2018   Degenerative arthritis 08/14/2013    Past Medical History: Past Medical History:  Diagnosis Date   Arthritis    Endometrial cancer (HCC)    Hypertension    Hypothyroid    NSTEMI (non-ST elevated myocardial infarction) (HCC)    Rheumatoid arthritis (HCC)    Rotator cuff tear arthropathy of right shoulder 02/24/2022   Thyroid disease     Past Surgical History: Past Surgical History:  Procedure Laterality Date   ABDOMINAL HYSTERECTOMY  06/19/2020   EUA, TLH-BSO with SLN mapping and dissection with Dr. Oneita Jolly at [facility]    CORONARY STENT INTERVENTION N/A 12/04/2018   Procedure: CORONARY STENT INTERVENTION;  Surgeon: Alwyn Pea, MD;  Location: [facility] INVASIVE CV LAB;  Service: Cardiovascular;  Laterality: N/A;   LEFT HEART CATH AND CORONARY ANGIOGRAPHY N/A 12/04/2018   Procedure: LEFT HEART CATH AND CORONARY ANGIOGRAPHY;  Surgeon: Laurier Nancy, MD;  Location: [facility] INVASIVE CV LAB;   Service: Cardiovascular;  Laterality: N/A;    Past Gynecologic History:  Menarche: 13 Menopause; 41 Last Pap unknown; no abnormal Pap   OB History: SVD x 3 OB History  Gravida Para Term Preterm AB Living  3 3      SAB IAB Ectopic Multiple Live Births          # Outcome Date GA Lbr Len/2nd Weight Sex Type Anes PTL Lv  3 Para           2 Para           1 Para             Family History: Family History  Problem Relation Age of Onset   Cancer Brother        prostate or colon unsure which    Breast cancer Neg Hx     Social History: Social History   Socioeconomic History   Marital status: Widowed    Spouse name: Not on file   Number of children: Not on file   Years of education: Not on file   Highest education level: Not on file  Occupational History   Not on file  Tobacco Use   Smoking status: Former   Smokeless tobacco: Never  Vaping Use   Vaping status: Never Used  Substance and Sexual Activity   Alcohol use: Never   Drug use: Never   Sexual activity: Not Currently  Other Topics Concern   Not on file  Social History Narrative   Lives with daughter   Social Drivers of Health   Financial Resource Strain: Low Risk  (09/02/2021)   Overall Financial Resource Strain (CARDIA)    Difficulty of Paying Living Expenses: Not hard at all  Food Insecurity: No Food Insecurity (09/09/2021)   Hunger Vital Sign    Worried About Running Out of Food in the Last Year: Never true    Ran Out of Food in the Last Year: Never true  Transportation Needs: No Transportation Needs (09/09/2021)   PRAPARE - Administrator, Civil Service (Medical): No    Lack of Transportation (Non-Medical): No  Physical Activity: Insufficiently Active (09/02/2021)   Exercise Vital Sign    Days of Exercise per Week: 5 days    Minutes of Exercise per Session: 10 min  Stress: No Stress Concern Present (09/02/2021)   [employer] of Occupational Health - Occupational Stress Questionnaire     Feeling of Stress : Not at all  Social Connections: Moderately Integrated (09/02/2021)   Social Connection and Isolation Panel [NHANES]    Frequency of Communication with Friends and Family: More than three times a week    Frequency of Social Gatherings with Friends and Family: More than three times a week    Attends Religious Services: More than 4 times per year    Active Member of [employer] or Organizations: Yes    Attends Banker Meetings: More than 4 times per year    Marital Status: Widowed  Intimate Partner Violence: Not on file    Allergies: Allergies  Allergen Reactions   Codeine Itching   Penicillins Other (See Comments)    unknown    Current Medications: Current Outpatient Medications  Medication Sig Dispense Refill   acetaminophen (TYLENOL) 650 MG CR tablet Take 1,300 mg by mouth daily.     aspirin 81 MG chewable tablet Chew 1 tablet (81 mg total) by mouth daily.     BRILINTA 60 MG TABS tablet TAKE 1 TABLET(60 MG) BY MOUTH TWICE DAILY 60 tablet 3   celecoxib (CELEBREX) 200 MG capsule Take 1 capsule (200 mg total) by mouth daily. 30 capsule 3   certolizumab pegol (CIMZIA) 2 X 200 MG/ML PSKT prefilled syringe Inject into the skin.     DULoxetine (CYMBALTA) 30 MG capsule TAKE 1 CAPSULE BY MOUTH EVERY DAY FOR MOOD 90 capsule 3   fluticasone (FLONASE) 50 MCG/ACT nasal spray Place 1 spray into both nostrils daily. 11.1 mL 6   folic acid (FOLVITE) 1 MG tablet Take 1 tablet (1 mg total) by mouth daily. 90 tablet 3   gabapentin (NEURONTIN) 100 MG capsule Take 100 mg by mouth 3 (three) times daily.     hydrochlorothiazide (HYDRODIURIL) 25 MG tablet TAKE 1 TABLET(25 MG) BY MOUTH DAILY 90 tablet 3   hydroxychloroquine (PLAQUENIL) 200 MG tablet Take by mouth.     levothyroxine (SYNTHROID) 50 MCG tablet Take 1 tablet (50 mcg total) by mouth daily. 90 tablet 3   losartan (COZAAR) 25 MG tablet Take 1 tablet (25 mg total) by mouth daily. 90 tablet 3   methotrexate  (RHEUMATREX) 2.5 MG tablet Take 15 mg by mouth once a week.     metoprolol succinate (TOPROL-XL) 100 MG 24 hr tablet TAKE 1 TABLET BY MOUTH EVERY DAY 90 tablet 0   ondansetron (ZOFRAN-ODT) 4 MG disintegrating tablet Take 1 tablet (4 mg total) by mouth every 8 (eight) hours as  needed for nausea or vomiting. 20 tablet 0   pantoprazole (PROTONIX) 40 MG tablet Take 1 tablet (40 mg total) by mouth daily. 90 tablet 3   rosuvastatin (CRESTOR) 40 MG tablet TAKE 1 TABLET BY MOUTH EVERY DAY 90 tablet 3   baclofen (LIORESAL) 10 MG tablet Take 1 tablet (10 mg total) by mouth daily. (Patient not taking: Reported on 05/10/2023) 30 tablet 1   Cholecalciferol 1.25 MG (50000 UT) capsule Take by mouth. (Patient not taking: Reported on 05/10/2023)     No current facility-administered medications for this visit.   Review of Systems General:  no complaints Skin: no complaints Eyes: no complaints HEENT: no complaints Breasts: no complaints Pulmonary: intermittent shortness of breath Cardiac: no complaints Gastrointestinal: no nausea or vomiting, diarrhea or constipation Genitourinary/Sexual: no complaints Ob/Gyn: vaginal spotting Musculoskeletal: back pain Hematology: no complaints Neurologic/Psych: numbness toes   Objective:  Physical Examination:  BP 119/76   Pulse 81   Temp 97.8 F (36.6 C)   Resp 17   Wt 194 lb 11.2 oz (88.3 kg)   SpO2 96%   BMI 34.49 kg/m    GENERAL: Patient is a well appearing female in no acute distress ABDOMEN:  Soft, nontender, nondistended. No masses or ascites.  EXTREMITIES:  No peripheral edema.   NEURO:  Nonfocal. Well oriented.  Appropriate affect.  Pelvic: EGBUS: no lesions Vagina: narrow vaginal vault and visualization difficult. Flesh colored lesion at the vaginal apex. A larger speculum was used for visualization but still limited exam. On that exam able to visualize another flesh colored area on the posterior vagina approximately mid way up from the introitus.   Cervix: absent Uterus: absent BME: no palpable pelvic masses; the vaginal cuff is smooth except for left fornix where there is a palpable 8 mm soft nodular area.  Rectovaginal: confirmatory   Procedure Note:  Informed Consent obtained. Time out performed. Upon visualization a portion of tissue was removed from the posterior vagina and vaginal cuff. Hemostasis excellent with AgNO3. Patient reassessed after procedure and in stable condition. No complications.  Lab Review N/A  Radiologic Imaging: As per HPI  Assessment:  Kimberly Burke is a 83 y.o. female diagnosed with high grade endometrial cancer (pMMR, MSS, ER 1+; PR negative; Her2neu negative, p53 positive expression-mutant pattern), stage IA noninvasive endometrial cancer with washings that did not definitively demonstrate malignancy 06/19/20. Discussed VBT vs surveillance post op and patient elected for surveillance. Clinically, asymptomatic. NED on exam. Interval CT scan 11/22 and 4/25 were negative for radiographic evidence of disease.   Exam concerning for recurrence, biopsies obtained  Possible abdominal hernia versus laxity- imaging in November 2022 revealed small fat containing right lateral abdominal wall hernia, asymptomatic  Back pain, suspect due to arthritis   Hepatic and renal cysts  Bone density scan 7/23 shows osteopenia.    Follows with Rheumatology for RA.   Medical co-morbidities complicating care: HTN, h/o STEMI 2020 s/p stent placement (Dr. Welton Flakes is her cardiologist), RA (Dr. Lavenia Atlas), hypothyroidism, Body mass index is 34.49 kg/m.  Plan:   Problem List Items Addressed This Visit       Genitourinary   Endometrial cancer [facility])   Relevant Orders   Surgical pathology   Other Visit Diagnoses       Abnormal vaginal bleeding in postmenopausal patient    -  Primary   Relevant Orders   Surgical pathology       Biopsies sent to [facility]. If positive recommend radiation oncology and  medical oncology consults. Most likely will recommend WPRT with Vaginal cuff brachtherapy. Will need to incorporate the involved vaginal disease in the radiation field. Chemotherapy with paclitaxel and carboplatin. Immunotherapy option limited in the setting of RA.   The patient's diagnosis, an outline of the further diagnostic and laboratory studies which will be required, the recommendation, and alternatives were discussed.  All questions were answered to the patient's satisfaction.  I personally had a face to face interaction and evaluated the patient jointly with the NP, Ms. Kenney Peacemaker.  I have reviewed her history and available records and have performed the key portions of the physical exam including lymph node survey, abdominal exam, pelvic exam with my findings confirming those documented above by the APP.  I have discussed the case with the APP and the patient.  I agree with the above documentation, assessment and plan which was fully formulated by me.  Counseling was completed by me.   I personally saw the patient and performed a substantive portion of this encounter in conjunction with the listed APP as documented above.  Patric Vanpelt Iola [place], MD

## 2023-05-15 ENCOUNTER — Telehealth: Payer: Self-pay | Admitting: *Deleted

## 2023-05-15 NOTE — Telephone Encounter (Signed)
 Patient called to get there pap smear and it has not resulted as of today. I told her that I can send message to Kimberly Burke to see how long it takes to get results

## 2023-05-16 DIAGNOSIS — M069 Rheumatoid arthritis, unspecified: Secondary | ICD-10-CM | POA: Diagnosis not present

## 2023-05-16 LAB — SURGICAL PATHOLOGY

## 2023-05-17 ENCOUNTER — Other Ambulatory Visit: Payer: Self-pay | Admitting: Nurse Practitioner

## 2023-05-17 DIAGNOSIS — C541 Malignant neoplasm of endometrium: Secondary | ICD-10-CM

## 2023-05-17 NOTE — Progress Notes (Signed)
 Called patient to discuss pathology:   FINAL DIAGNOSIS        1. Vagina, biopsy, vagina apex and posterior vagina wall :       - HIGH GRADE SEROUS CARCINOMA, SEE NOTE        Diagnosis Note : Immunohistochemical stains show that the tumor cells are       positive for p16, p53 (clonal overexpression), CK7 and PAX8; while they are       negative for CK20 and ER. This immunoprofile is consistent with the above       interpretation.  Dr. Kavin Parsley reviewed the case and concurred with the       interpretation.   No answer. Left vm to return call.

## 2023-05-17 NOTE — Progress Notes (Signed)
 Spoke to patient by phone. Reviewed pathology consistent with local recurrence of her high grade serous endometrial cancer. She agrees to referral for radiation. Referral sent. We will plan to see her back with gyn onc after she completes and recovers from radiation.

## 2023-05-18 ENCOUNTER — Encounter: Payer: Self-pay | Admitting: Radiation Oncology

## 2023-05-18 ENCOUNTER — Ambulatory Visit
Admission: RE | Admit: 2023-05-18 | Discharge: 2023-05-18 | Disposition: A | Discharge status: Home / Self Care | Source: Ambulatory Visit | Attending: Radiation Oncology | Admitting: Radiation Oncology

## 2023-05-18 VITALS — BP 123/77 | HR 72 | Temp 97.6°F | Resp 16 | Ht 63.0 in | Wt 194.0 lb

## 2023-05-18 DIAGNOSIS — Z791 Long term (current) use of non-steroidal anti-inflammatories (NSAID): Secondary | ICD-10-CM | POA: Insufficient documentation

## 2023-05-18 DIAGNOSIS — M069 Rheumatoid arthritis, unspecified: Secondary | ICD-10-CM | POA: Insufficient documentation

## 2023-05-18 DIAGNOSIS — Z809 Family history of malignant neoplasm, unspecified: Secondary | ICD-10-CM | POA: Diagnosis not present

## 2023-05-18 DIAGNOSIS — Z79899 Other long term (current) drug therapy: Secondary | ICD-10-CM | POA: Insufficient documentation

## 2023-05-18 DIAGNOSIS — Z7982 Long term (current) use of aspirin: Secondary | ICD-10-CM | POA: Diagnosis not present

## 2023-05-18 DIAGNOSIS — I252 Old myocardial infarction: Secondary | ICD-10-CM | POA: Diagnosis not present

## 2023-05-18 DIAGNOSIS — M129 Arthropathy, unspecified: Secondary | ICD-10-CM | POA: Insufficient documentation

## 2023-05-18 DIAGNOSIS — C541 Malignant neoplasm of endometrium: Secondary | ICD-10-CM | POA: Diagnosis present

## 2023-05-18 DIAGNOSIS — E039 Hypothyroidism, unspecified: Secondary | ICD-10-CM | POA: Diagnosis not present

## 2023-05-18 DIAGNOSIS — I10 Essential (primary) hypertension: Secondary | ICD-10-CM | POA: Diagnosis not present

## 2023-05-18 DIAGNOSIS — Z79890 Hormone replacement therapy (postmenopausal): Secondary | ICD-10-CM | POA: Diagnosis not present

## 2023-05-18 DIAGNOSIS — Z7902 Long term (current) use of antithrombotics/antiplatelets: Secondary | ICD-10-CM | POA: Insufficient documentation

## 2023-05-18 DIAGNOSIS — Z90722 Acquired absence of ovaries, bilateral: Secondary | ICD-10-CM | POA: Diagnosis not present

## 2023-05-18 DIAGNOSIS — Z79631 Long term (current) use of antimetabolite agent: Secondary | ICD-10-CM | POA: Insufficient documentation

## 2023-05-18 DIAGNOSIS — Z90710 Acquired absence of both cervix and uterus: Secondary | ICD-10-CM | POA: Insufficient documentation

## 2023-05-18 NOTE — Consult Note (Signed)
 NEW PATIENT EVALUATION  Name: Kimberly Burke  MRN: [id number]  Date:   05/18/2023     DOB: 1941/01/14   This 83 y.o. female patient presents to the clinic for initial evaluation of vaginal recurrence of high-grade endometrial carcinoma.  REFERRING PHYSICIAN: Aisha Hove, MD  CHIEF COMPLAINT:  Chief Complaint  Patient presents with   Consult    DIAGNOSIS: The encounter diagnosis was Endometrial cancer (HCC).   PREVIOUS INVESTIGATIONS:  CT scan reviewed PET CT scan ordered Pathology report reviewed Clinical notes reviewed  HPI: Patient is a 83 year old female status post TLH BSO SLN mapping and dissection at [facility] in May 2022 for high-grade endometrial carcinoma.  Patient is gravida 3 para 3.  She initially presented with postmenopausal bleeding.  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  She underwent laparoscopic assisted hysterectomy bilateral salpingo-oophorectomy bilateral sentinel node biopsies.  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  Mismatch repair was intact.  Recommendation at tumor board at [facility] was for DBT although patient opted for surveillance.  Patient recently presented again with vaginal spotting.  Repeat CT scan showed no evidence of metastatic disease within the chest abdomen or pelvis no evidence of large adnexal mass was noted.  She was noted to have a vaginal mass which was biopsied and positive for high-grade serous carcinoma.  Tumor was positive for p16, p53, CK7 and PAX8.  She is seen today for radiation oncology consultation.  She is doing well specifically denies any vaginal bleeding any increased lower urinary tract symptoms abdominal complaints or rectal complaints.  PLANNED TREATMENT REGIMEN: Whole pelvic radiation plus BBT.  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    PAST SURGICAL HISTORY:  Past Surgical History:  Procedure Laterality Date   ABDOMINAL HYSTERECTOMY  06/19/2020   EUA, TLH-BSO with SLN mapping and dissection with Dr. Comer Decamp at [facility]    CORONARY STENT INTERVENTION N/A 12/04/2018   Procedure: CORONARY STENT INTERVENTION;  Surgeon: Antonette Batters, MD;  Location: [facility] INVASIVE CV LAB;  Service: Cardiovascular;  Laterality: N/A;   LEFT HEART CATH AND CORONARY ANGIOGRAPHY N/A 12/04/2018   Procedure: LEFT HEART CATH AND CORONARY ANGIOGRAPHY;  Surgeon: Cherrie Cornwall, MD;  Location: [facility] INVASIVE CV LAB;  Service: Cardiovascular;  Laterality: N/A;    FAMILY HISTORY: family history includes Cancer in her brother.  SOCIAL HISTORY:  reports that she has quit smoking. She has never used smokeless tobacco. She reports that she does not drink alcohol and does not use drugs.  ALLERGIES: Codeine and Penicillins  MEDICATIONS:  Current Outpatient Medications  Medication Sig Dispense Refill   acetaminophen  (TYLENOL ) 650 MG CR tablet Take 1,300 mg by mouth daily.     aspirin  81 MG chewable tablet Chew 1 tablet (81 mg total) by mouth daily.     Cholecalciferol 1.25 MG (50000 UT) capsule Take by mouth.     DULoxetine  (CYMBALTA ) 30 MG capsule TAKE 1 CAPSULE BY MOUTH EVERY DAY FOR MOOD 90 capsule 3   fluticasone  (FLONASE ) 50 MCG/ACT nasal spray Place 1 spray into both nostrils daily. 11.1 mL 6   folic acid  (FOLVITE ) 1 MG tablet Take 1 tablet (1 mg total)  by mouth daily. 90 tablet 3   gabapentin (NEURONTIN) 100 MG capsule Take 100 mg by mouth 3 (three) times daily.     hydrochlorothiazide  (HYDRODIURIL ) 25 MG tablet TAKE 1 TABLET(25 MG) BY MOUTH DAILY 90 tablet 3   hydroxychloroquine (PLAQUENIL) 200 MG  tablet Take by mouth.     levothyroxine  (SYNTHROID ) 50 MCG tablet Take 1 tablet (50 mcg total) by mouth daily. 90 tablet 3   losartan  (COZAAR ) 25 MG tablet Take 1 tablet (25 mg total) by mouth daily. 90 tablet 3   methotrexate (RHEUMATREX) 2.5 MG tablet Take 15 mg by mouth once a week.     metoprolol  succinate (TOPROL -XL) 100 MG 24 hr tablet TAKE 1 TABLET BY MOUTH EVERY DAY 90 tablet 0   ondansetron  (ZOFRAN -ODT) 4 MG disintegrating tablet Take 1 tablet (4 mg total) by mouth every 8 (eight) hours as needed for nausea or vomiting. 20 tablet 0   pantoprazole  (PROTONIX ) 40 MG tablet Take 1 tablet (40 mg total) by mouth daily. 90 tablet 3   rosuvastatin  (CRESTOR ) 40 MG tablet TAKE 1 TABLET BY MOUTH EVERY DAY 90 tablet 3   baclofen  (LIORESAL ) 10 MG tablet Take 1 tablet (10 mg total) by mouth daily. (Patient not taking: Reported on 05/10/2023) 30 tablet 1   BRILINTA  60 MG TABS tablet TAKE 1 TABLET(60 MG) BY MOUTH TWICE DAILY (Patient not taking: Reported on 05/18/2023) 60 tablet 3   celecoxib  (CELEBREX ) 200 MG capsule Take 1 capsule (200 mg total) by mouth daily. (Patient not taking: Reported on 05/18/2023) 30 capsule 3   certolizumab pegol  (CIMZIA ) 2 X 200 MG/ML PSKT prefilled syringe Inject into the skin.     No current facility-administered medications for this encounter.    ECOG PERFORMANCE STATUS:  0 - Asymptomatic  REVIEW OF SYSTEMS: Patient denies any weight loss, fatigue, weakness, fever, chills or night sweats. Patient denies any loss of vision, blurred vision. Patient denies any ringing  of the ears or hearing loss. No irregular heartbeat. Patient denies heart murmur or history of fainting. Patient denies any chest pain or pain radiating to her upper extremities. Patient denies any shortness of breath, difficulty breathing at night, cough or hemoptysis. Patient denies any swelling in the lower legs. Patient denies any nausea vomiting, vomiting of blood, or coffee ground material in the vomitus.  Patient denies any stomach pain. Patient states has had normal bowel movements no significant constipation or diarrhea. Patient denies any dysuria, hematuria or significant nocturia. Patient denies any problems walking, swelling in the joints or loss of balance. Patient denies any skin changes, loss of hair or loss of weight. Patient denies any excessive worrying or anxiety or significant depression. Patient denies any problems with insomnia. Patient denies excessive thirst, polyuria, polydipsia. Patient denies any swollen glands, patient denies easy bruising or easy bleeding. Patient denies any recent infections, allergies or URI. Patient "s visual fields have not changed significantly in recent time.   PHYSICAL EXAM: BP 123/77   Pulse 72   Temp 97.6 F (36.4 C) (Tympanic)   Resp 16   Ht 5\' 3"  (1.6 m)   Wt 194 lb (88 kg)   BMI 34.37 kg/m  Well-developed well-nourished patient in NAD. HEENT reveals PERLA, EOMI, discs not visualized.  Oral cavity is clear. No oral mucosal lesions are identified. Neck is clear without evidence of cervical or supraclavicular adenopathy. Lungs are clear to A&P. Cardiac examination is essentially unremarkable with regular rate and rhythm without murmur rub or thrill. Abdomen is  benign with no organomegaly or masses noted. Motor sensory and DTR levels are equal and symmetric in the upper and lower extremities. Cranial nerves II through XII are grossly intact. Proprioception is intact. No peripheral adenopathy or edema is identified. No motor or sensory levels are noted. Crude visual fields are within normal range.  LABORATORY DATA: Pathology reports reviewed    RADIOLOGY RESULTS: CT scan reviewed PET scan ordered   IMPRESSION: Recurrent high-grade serous endometrial carcinoma in 83 year old female status post TAH/BSO and sentinel lymph node biopsy back in 71 in 83 year old female  PLAN: At this time I am obtaining a PET scan.  Will also refer patient to medical  oncology for evaluation and opinion.  Would treat with a combination of IMRT whole pelvic radiation therapy plus vaginal brachytherapy.  Risks and benefits of treatment including increased lower Neri tract symptoms possible diarrhea fatigue alteration blood counts skin reaction all were reviewed in detail with the patient.  I will see her back for follow-up after her PET scan is performed.  Patient comprehends recommendations well.  I would like to take this opportunity to thank you for allowing me to participate in the care of your patient.Glenis Langdon, MD

## 2023-05-23 ENCOUNTER — Encounter
Admission: RE | Admit: 2023-05-23 | Discharge: 2023-05-23 | Disposition: A | Discharge status: Home / Self Care | Source: Ambulatory Visit | Attending: Radiation Oncology | Admitting: Radiation Oncology

## 2023-05-23 DIAGNOSIS — C541 Malignant neoplasm of endometrium: Secondary | ICD-10-CM | POA: Insufficient documentation

## 2023-05-23 LAB — GLUCOSE, CAPILLARY: Glucose-Capillary: 103 mg/dL — ABNORMAL HIGH (ref 70–99)

## 2023-05-23 MED ORDER — FLUDEOXYGLUCOSE F - 18 (FDG) INJECTION: 10.7000 | Freq: Once | INTRAVENOUS | Status: DC | PRN

## 2023-05-29 ENCOUNTER — Ambulatory Visit
Admission: RE | Admit: 2023-05-29 | Discharge: 2023-05-29 | Disposition: A | Discharge status: Home / Self Care | Source: Ambulatory Visit | Attending: Radiation Oncology | Admitting: Radiation Oncology

## 2023-05-29 ENCOUNTER — Encounter: Payer: Self-pay | Admitting: Radiation Oncology

## 2023-05-29 VITALS — BP 131/70 | HR 72 | Temp 98.5°F | Resp 20 | Wt 192.0 lb

## 2023-05-29 DIAGNOSIS — C541 Malignant neoplasm of endometrium: Secondary | ICD-10-CM | POA: Insufficient documentation

## 2023-05-29 NOTE — Progress Notes (Signed)
 Radiation Oncology Follow up Note  Name: Kimberly Burke   Date:   05/29/2023 MRN:  [id number] DOB: 24-Aug-1940    This 83 y.o. female presents to the clinic today for review of PET scan and patient with recurrent high-grade endometrial carcinoma status post TLH BSO in 2022 and now with vaginal recurrence.  REFERRING PROVIDER: Aisha Hove, MD  HPI: Patient is an 83 year old female originally consulted for 25 for recurrent high-grade endometrial carcinoma in the vagina and a patient's status post TLH BSO and SLN mapping in May 2022 at [facility].  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  Mismatch repair was intact..  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  I recommended in consultation with GYN oncology to proceed with external beam pelvic radiation therapy plus vaginal brachytherapy.  She had a PET scan which I ordered and have reviewed showing no areas of abnormal radiotracer uptake to suggest recurrent or metastatic disease.  There was some uptake along the anorectal region although there was a significant mount of stool in her rectum.  She is seen today and is doing well she has no complaints.  COMPLICATIONS OF TREATMENT: none  FOLLOW UP COMPLIANCE: keeps appointments   PHYSICAL EXAM:  BP 131/70   Pulse 72   Temp 98.5 F (36.9 C) (Tympanic)   Resp 20   Wt 192 lb (87.1 kg)   BMI 34.01 kg/m  Well-developed well-nourished patient in NAD. HEENT reveals PERLA, EOMI, discs not visualized.  Oral cavity is clear. No oral mucosal lesions are identified. Neck is clear without evidence of cervical or supraclavicular adenopathy. Lungs are clear to A&P. Cardiac examination is essentially unremarkable with regular rate and rhythm without murmur rub or thrill. Abdomen is benign with no organomegaly or masses noted. Motor sensory and DTR  levels are equal and symmetric in the upper and lower extremities. Cranial nerves II through XII are grossly intact. Proprioception is intact. No peripheral adenopathy or edema is identified. No motor or sensory levels are noted. Crude visual fields are within normal range.  RADIOLOGY RESULTS: PSMA PET scan reviewed compatible with above-stated findings  PLAN: At this time we will proceed with IMRT pelvic radiation therapy plus vaginal brachytherapy.  Will plan on delivering 45 Gray in 25 fractions using IMRT treatment planning and delivery to her pelvis.  Would also boost with vaginal brachytherapy 18 Gray in 3 fractions.  Risks and benefits of treatment including increased lower urinary tract symptoms possible diarrhea fatigue alteration blood counts skin reaction all were reviewed in detail with the patient.  I have personally set up and ordered CT simulation for later this week.  Patient comprehends my recommendations well.  I would like to take this opportunity to thank you for allowing me to participate in the care of your patient.Glenis Langdon, MD

## 2023-05-31 ENCOUNTER — Ambulatory Visit
Admission: RE | Admit: 2023-05-31 | Discharge: 2023-05-31 | Disposition: A | Discharge status: Home / Self Care | Source: Ambulatory Visit | Attending: Radiation Oncology | Admitting: Radiation Oncology

## 2023-05-31 DIAGNOSIS — C541 Malignant neoplasm of endometrium: Secondary | ICD-10-CM | POA: Diagnosis not present

## 2023-06-02 ENCOUNTER — Other Ambulatory Visit: Payer: Self-pay | Admitting: *Deleted

## 2023-06-02 DIAGNOSIS — C541 Malignant neoplasm of endometrium: Secondary | ICD-10-CM

## 2023-06-03 ENCOUNTER — Other Ambulatory Visit: Payer: Self-pay | Admitting: Cardiovascular Disease

## 2023-06-05 DIAGNOSIS — C541 Malignant neoplasm of endometrium: Secondary | ICD-10-CM | POA: Diagnosis not present

## 2023-06-08 ENCOUNTER — Ambulatory Visit
Admission: RE | Admit: 2023-06-08 | Discharge: 2023-06-08 | Disposition: A | Discharge status: Home / Self Care | Source: Ambulatory Visit | Attending: Radiation Oncology | Admitting: Radiation Oncology

## 2023-06-12 ENCOUNTER — Other Ambulatory Visit: Payer: Self-pay

## 2023-06-12 ENCOUNTER — Ambulatory Visit
Admission: RE | Admit: 2023-06-12 | Discharge: 2023-06-12 | Disposition: A | Discharge status: Home / Self Care | Source: Ambulatory Visit | Attending: Radiation Oncology | Admitting: Radiation Oncology

## 2023-06-12 DIAGNOSIS — C541 Malignant neoplasm of endometrium: Secondary | ICD-10-CM | POA: Diagnosis not present

## 2023-06-12 DIAGNOSIS — Z510 Encounter for antineoplastic radiation therapy: Secondary | ICD-10-CM | POA: Diagnosis not present

## 2023-06-12 LAB — RAD ONC ARIA SESSION SUMMARY
Course Elapsed Days: 0
Plan Fractions Treated to Date: 1
Plan Prescribed Dose Per Fraction: 1.8 Gy
Plan Total Fractions Prescribed: 25
Plan Total Prescribed Dose: 45 Gy
Reference Point Dosage Given to Date: 1.8 Gy
Reference Point Session Dosage Given: 1.8 Gy
Session Number: 1

## 2023-06-13 ENCOUNTER — Ambulatory Visit
Admission: RE | Admit: 2023-06-13 | Discharge: 2023-06-13 | Disposition: A | Discharge status: Home / Self Care | Source: Ambulatory Visit | Attending: Radiation Oncology | Admitting: Radiation Oncology

## 2023-06-13 ENCOUNTER — Other Ambulatory Visit: Payer: Self-pay | Admitting: Cardiovascular Disease

## 2023-06-13 ENCOUNTER — Other Ambulatory Visit: Payer: Self-pay

## 2023-06-13 DIAGNOSIS — Z510 Encounter for antineoplastic radiation therapy: Secondary | ICD-10-CM | POA: Diagnosis not present

## 2023-06-13 DIAGNOSIS — I2510 Atherosclerotic heart disease of native coronary artery without angina pectoris: Secondary | ICD-10-CM

## 2023-06-13 DIAGNOSIS — C541 Malignant neoplasm of endometrium: Secondary | ICD-10-CM | POA: Diagnosis not present

## 2023-06-13 LAB — RAD ONC ARIA SESSION SUMMARY
Course Elapsed Days: 1
Plan Fractions Treated to Date: 2
Plan Prescribed Dose Per Fraction: 1.8 Gy
Plan Total Fractions Prescribed: 25
Plan Total Prescribed Dose: 45 Gy
Reference Point Dosage Given to Date: 3.6 Gy
Reference Point Session Dosage Given: 1.8 Gy
Session Number: 2

## 2023-06-14 ENCOUNTER — Other Ambulatory Visit: Payer: Self-pay

## 2023-06-14 ENCOUNTER — Ambulatory Visit
Admission: RE | Admit: 2023-06-14 | Discharge: 2023-06-14 | Disposition: A | Discharge status: Home / Self Care | Source: Ambulatory Visit | Attending: Radiation Oncology | Admitting: Radiation Oncology

## 2023-06-14 DIAGNOSIS — C541 Malignant neoplasm of endometrium: Secondary | ICD-10-CM | POA: Diagnosis not present

## 2023-06-14 DIAGNOSIS — Z510 Encounter for antineoplastic radiation therapy: Secondary | ICD-10-CM | POA: Diagnosis not present

## 2023-06-14 LAB — RAD ONC ARIA SESSION SUMMARY
Course Elapsed Days: 2
Plan Fractions Treated to Date: 3
Plan Prescribed Dose Per Fraction: 1.8 Gy
Plan Total Fractions Prescribed: 25
Plan Total Prescribed Dose: 45 Gy
Reference Point Dosage Given to Date: 5.4 Gy
Reference Point Session Dosage Given: 1.8 Gy
Session Number: 3

## 2023-06-15 ENCOUNTER — Other Ambulatory Visit: Payer: Self-pay

## 2023-06-15 ENCOUNTER — Inpatient Hospital Stay

## 2023-06-15 ENCOUNTER — Ambulatory Visit
Admission: RE | Admit: 2023-06-15 | Discharge: 2023-06-15 | Disposition: A | Discharge status: Home / Self Care | Source: Ambulatory Visit | Attending: Radiation Oncology | Admitting: Radiation Oncology

## 2023-06-15 DIAGNOSIS — Z510 Encounter for antineoplastic radiation therapy: Secondary | ICD-10-CM | POA: Diagnosis not present

## 2023-06-15 DIAGNOSIS — C541 Malignant neoplasm of endometrium: Secondary | ICD-10-CM | POA: Insufficient documentation

## 2023-06-15 LAB — CBC (CANCER CENTER ONLY)
HCT: 36.6 % (ref 36.0–46.0)
Hemoglobin: 11.9 g/dL — ABNORMAL LOW (ref 12.0–15.0)
MCH: 31.5 pg (ref 26.0–34.0)
MCHC: 32.5 g/dL (ref 30.0–36.0)
MCV: 96.8 fL (ref 80.0–100.0)
Platelet Count: 223 10*3/uL (ref 150–400)
RBC: 3.78 MIL/uL — ABNORMAL LOW (ref 3.87–5.11)
RDW: 14.8 % (ref 11.5–15.5)
WBC Count: 5.4 10*3/uL (ref 4.0–10.5)
nRBC: 0 % (ref 0.0–0.2)

## 2023-06-15 LAB — RAD ONC ARIA SESSION SUMMARY
Course Elapsed Days: 3
Plan Fractions Treated to Date: 4
Plan Prescribed Dose Per Fraction: 1.8 Gy
Plan Total Fractions Prescribed: 25
Plan Total Prescribed Dose: 45 Gy
Reference Point Dosage Given to Date: 7.2 Gy
Reference Point Session Dosage Given: 1.8 Gy
Session Number: 4

## 2023-06-16 ENCOUNTER — Ambulatory Visit
Admission: RE | Admit: 2023-06-16 | Discharge: 2023-06-16 | Disposition: A | Discharge status: Home / Self Care | Source: Ambulatory Visit | Attending: Radiation Oncology | Admitting: Radiation Oncology

## 2023-06-16 ENCOUNTER — Other Ambulatory Visit: Payer: Self-pay

## 2023-06-16 DIAGNOSIS — Z510 Encounter for antineoplastic radiation therapy: Secondary | ICD-10-CM | POA: Diagnosis not present

## 2023-06-16 DIAGNOSIS — C541 Malignant neoplasm of endometrium: Secondary | ICD-10-CM | POA: Diagnosis not present

## 2023-06-16 LAB — RAD ONC ARIA SESSION SUMMARY
Course Elapsed Days: 4
Plan Fractions Treated to Date: 5
Plan Prescribed Dose Per Fraction: 1.8 Gy
Plan Total Fractions Prescribed: 25
Plan Total Prescribed Dose: 45 Gy
Reference Point Dosage Given to Date: 9 Gy
Reference Point Session Dosage Given: 1.8 Gy
Session Number: 5

## 2023-06-20 ENCOUNTER — Ambulatory Visit
Admission: RE | Admit: 2023-06-20 | Discharge: 2023-06-20 | Disposition: A | Discharge status: Home / Self Care | Source: Ambulatory Visit | Attending: Radiation Oncology | Admitting: Radiation Oncology

## 2023-06-20 ENCOUNTER — Other Ambulatory Visit: Payer: Self-pay

## 2023-06-20 DIAGNOSIS — Z510 Encounter for antineoplastic radiation therapy: Secondary | ICD-10-CM | POA: Diagnosis not present

## 2023-06-20 DIAGNOSIS — C541 Malignant neoplasm of endometrium: Secondary | ICD-10-CM | POA: Diagnosis not present

## 2023-06-20 LAB — RAD ONC ARIA SESSION SUMMARY
Course Elapsed Days: 8
Plan Fractions Treated to Date: 6
Plan Prescribed Dose Per Fraction: 1.8 Gy
Plan Total Fractions Prescribed: 25
Plan Total Prescribed Dose: 45 Gy
Reference Point Dosage Given to Date: 10.8 Gy
Reference Point Session Dosage Given: 1.8 Gy
Session Number: 6

## 2023-06-21 ENCOUNTER — Other Ambulatory Visit: Payer: Self-pay

## 2023-06-21 ENCOUNTER — Ambulatory Visit
Admission: RE | Admit: 2023-06-21 | Discharge: 2023-06-21 | Disposition: A | Discharge status: Home / Self Care | Source: Ambulatory Visit | Attending: Radiation Oncology | Admitting: Radiation Oncology

## 2023-06-21 DIAGNOSIS — C541 Malignant neoplasm of endometrium: Secondary | ICD-10-CM | POA: Diagnosis not present

## 2023-06-21 DIAGNOSIS — Z510 Encounter for antineoplastic radiation therapy: Secondary | ICD-10-CM | POA: Diagnosis not present

## 2023-06-21 DIAGNOSIS — M069 Rheumatoid arthritis, unspecified: Secondary | ICD-10-CM | POA: Diagnosis not present

## 2023-06-21 DIAGNOSIS — Z79899 Other long term (current) drug therapy: Secondary | ICD-10-CM | POA: Diagnosis not present

## 2023-06-21 DIAGNOSIS — M0579 Rheumatoid arthritis with rheumatoid factor of multiple sites without organ or systems involvement: Secondary | ICD-10-CM | POA: Diagnosis not present

## 2023-06-21 LAB — RAD ONC ARIA SESSION SUMMARY
Course Elapsed Days: 9
Plan Fractions Treated to Date: 7
Plan Prescribed Dose Per Fraction: 1.8 Gy
Plan Total Fractions Prescribed: 25
Plan Total Prescribed Dose: 45 Gy
Reference Point Dosage Given to Date: 12.6 Gy
Reference Point Session Dosage Given: 1.8 Gy
Session Number: 7

## 2023-06-22 ENCOUNTER — Other Ambulatory Visit: Payer: Self-pay

## 2023-06-22 ENCOUNTER — Ambulatory Visit
Admission: RE | Admit: 2023-06-22 | Discharge: 2023-06-22 | Disposition: A | Discharge status: Home / Self Care | Source: Ambulatory Visit | Attending: Radiation Oncology | Admitting: Radiation Oncology

## 2023-06-22 DIAGNOSIS — C541 Malignant neoplasm of endometrium: Secondary | ICD-10-CM | POA: Diagnosis not present

## 2023-06-22 DIAGNOSIS — Z510 Encounter for antineoplastic radiation therapy: Secondary | ICD-10-CM | POA: Diagnosis not present

## 2023-06-22 LAB — RAD ONC ARIA SESSION SUMMARY
Course Elapsed Days: 10
Plan Fractions Treated to Date: 8
Plan Prescribed Dose Per Fraction: 1.8 Gy
Plan Total Fractions Prescribed: 25
Plan Total Prescribed Dose: 45 Gy
Reference Point Dosage Given to Date: 14.4 Gy
Reference Point Session Dosage Given: 1.8 Gy
Session Number: 8

## 2023-06-23 ENCOUNTER — Other Ambulatory Visit: Payer: Self-pay

## 2023-06-23 ENCOUNTER — Ambulatory Visit
Admission: RE | Admit: 2023-06-23 | Discharge: 2023-06-23 | Disposition: A | Discharge status: Home / Self Care | Source: Ambulatory Visit | Attending: Radiation Oncology | Admitting: Radiation Oncology

## 2023-06-23 DIAGNOSIS — Z510 Encounter for antineoplastic radiation therapy: Secondary | ICD-10-CM | POA: Diagnosis not present

## 2023-06-23 DIAGNOSIS — C541 Malignant neoplasm of endometrium: Secondary | ICD-10-CM | POA: Diagnosis not present

## 2023-06-23 LAB — RAD ONC ARIA SESSION SUMMARY
Course Elapsed Days: 11
Plan Fractions Treated to Date: 9
Plan Prescribed Dose Per Fraction: 1.8 Gy
Plan Total Fractions Prescribed: 25
Plan Total Prescribed Dose: 45 Gy
Reference Point Dosage Given to Date: 16.2 Gy
Reference Point Session Dosage Given: 1.8 Gy
Session Number: 9

## 2023-06-26 ENCOUNTER — Ambulatory Visit
Admission: RE | Admit: 2023-06-26 | Discharge: 2023-06-26 | Disposition: A | Discharge status: Home / Self Care | Source: Ambulatory Visit | Attending: Radiation Oncology | Admitting: Radiation Oncology

## 2023-06-26 ENCOUNTER — Other Ambulatory Visit: Payer: Self-pay

## 2023-06-26 DIAGNOSIS — C541 Malignant neoplasm of endometrium: Secondary | ICD-10-CM | POA: Insufficient documentation

## 2023-06-26 DIAGNOSIS — Z510 Encounter for antineoplastic radiation therapy: Secondary | ICD-10-CM | POA: Diagnosis not present

## 2023-06-26 LAB — RAD ONC ARIA SESSION SUMMARY
Course Elapsed Days: 14
Plan Fractions Treated to Date: 10
Plan Prescribed Dose Per Fraction: 1.8 Gy
Plan Total Fractions Prescribed: 25
Plan Total Prescribed Dose: 45 Gy
Reference Point Dosage Given to Date: 18 Gy
Reference Point Session Dosage Given: 1.8 Gy
Session Number: 10

## 2023-06-27 ENCOUNTER — Other Ambulatory Visit: Payer: Self-pay

## 2023-06-27 ENCOUNTER — Ambulatory Visit
Admission: RE | Admit: 2023-06-27 | Discharge: 2023-06-27 | Disposition: A | Discharge status: Home / Self Care | Source: Ambulatory Visit | Attending: Radiation Oncology | Admitting: Radiation Oncology

## 2023-06-27 DIAGNOSIS — Z510 Encounter for antineoplastic radiation therapy: Secondary | ICD-10-CM | POA: Diagnosis not present

## 2023-06-27 DIAGNOSIS — C541 Malignant neoplasm of endometrium: Secondary | ICD-10-CM | POA: Diagnosis not present

## 2023-06-27 LAB — RAD ONC ARIA SESSION SUMMARY
Course Elapsed Days: 15
Plan Fractions Treated to Date: 11
Plan Prescribed Dose Per Fraction: 1.8 Gy
Plan Total Fractions Prescribed: 25
Plan Total Prescribed Dose: 45 Gy
Reference Point Dosage Given to Date: 19.8 Gy
Reference Point Session Dosage Given: 1.8 Gy
Session Number: 11

## 2023-06-28 ENCOUNTER — Ambulatory Visit
Admission: RE | Admit: 2023-06-28 | Discharge: 2023-06-28 | Disposition: A | Discharge status: Home / Self Care | Source: Ambulatory Visit | Attending: Radiation Oncology | Admitting: Radiation Oncology

## 2023-06-28 ENCOUNTER — Other Ambulatory Visit: Payer: Self-pay

## 2023-06-28 DIAGNOSIS — C541 Malignant neoplasm of endometrium: Secondary | ICD-10-CM | POA: Diagnosis not present

## 2023-06-28 DIAGNOSIS — Z510 Encounter for antineoplastic radiation therapy: Secondary | ICD-10-CM | POA: Diagnosis not present

## 2023-06-28 LAB — RAD ONC ARIA SESSION SUMMARY
Course Elapsed Days: 16
Plan Fractions Treated to Date: 12
Plan Prescribed Dose Per Fraction: 1.8 Gy
Plan Total Fractions Prescribed: 25
Plan Total Prescribed Dose: 45 Gy
Reference Point Dosage Given to Date: 21.6 Gy
Reference Point Session Dosage Given: 1.8 Gy
Session Number: 12

## 2023-06-29 ENCOUNTER — Other Ambulatory Visit: Payer: Self-pay

## 2023-06-29 ENCOUNTER — Inpatient Hospital Stay: Attending: Obstetrics and Gynecology

## 2023-06-29 ENCOUNTER — Ambulatory Visit
Admission: RE | Admit: 2023-06-29 | Discharge: 2023-06-29 | Disposition: A | Discharge status: Home / Self Care | Source: Ambulatory Visit | Attending: Radiation Oncology | Admitting: Radiation Oncology

## 2023-06-29 DIAGNOSIS — Z510 Encounter for antineoplastic radiation therapy: Secondary | ICD-10-CM | POA: Diagnosis not present

## 2023-06-29 DIAGNOSIS — C541 Malignant neoplasm of endometrium: Secondary | ICD-10-CM | POA: Diagnosis not present

## 2023-06-29 LAB — RAD ONC ARIA SESSION SUMMARY
Course Elapsed Days: 17
Plan Fractions Treated to Date: 13
Plan Prescribed Dose Per Fraction: 1.8 Gy
Plan Total Fractions Prescribed: 25
Plan Total Prescribed Dose: 45 Gy
Reference Point Dosage Given to Date: 23.4 Gy
Reference Point Session Dosage Given: 1.8 Gy
Session Number: 13

## 2023-06-30 ENCOUNTER — Other Ambulatory Visit: Payer: Self-pay

## 2023-06-30 ENCOUNTER — Ambulatory Visit
Admission: RE | Admit: 2023-06-30 | Discharge: 2023-06-30 | Disposition: A | Discharge status: Home / Self Care | Source: Ambulatory Visit | Attending: Radiation Oncology | Admitting: Radiation Oncology

## 2023-06-30 DIAGNOSIS — Z510 Encounter for antineoplastic radiation therapy: Secondary | ICD-10-CM | POA: Diagnosis not present

## 2023-06-30 DIAGNOSIS — C541 Malignant neoplasm of endometrium: Secondary | ICD-10-CM | POA: Diagnosis not present

## 2023-06-30 LAB — RAD ONC ARIA SESSION SUMMARY
Course Elapsed Days: 18
Plan Fractions Treated to Date: 14
Plan Prescribed Dose Per Fraction: 1.8 Gy
Plan Total Fractions Prescribed: 25
Plan Total Prescribed Dose: 45 Gy
Reference Point Dosage Given to Date: 25.2 Gy
Reference Point Session Dosage Given: 1.8 Gy
Session Number: 14

## 2023-07-03 ENCOUNTER — Other Ambulatory Visit: Payer: Self-pay | Admitting: Cardiovascular Disease

## 2023-07-03 ENCOUNTER — Ambulatory Visit
Admission: RE | Admit: 2023-07-03 | Discharge: 2023-07-03 | Disposition: A | Discharge status: Home / Self Care | Source: Ambulatory Visit | Attending: Radiation Oncology | Admitting: Radiation Oncology

## 2023-07-03 ENCOUNTER — Other Ambulatory Visit: Payer: Self-pay

## 2023-07-03 DIAGNOSIS — I214 Non-ST elevation (NSTEMI) myocardial infarction: Secondary | ICD-10-CM

## 2023-07-03 DIAGNOSIS — C541 Malignant neoplasm of endometrium: Secondary | ICD-10-CM | POA: Diagnosis not present

## 2023-07-03 DIAGNOSIS — Z510 Encounter for antineoplastic radiation therapy: Secondary | ICD-10-CM | POA: Diagnosis not present

## 2023-07-03 LAB — RAD ONC ARIA SESSION SUMMARY
Course Elapsed Days: 21
Plan Fractions Treated to Date: 15
Plan Prescribed Dose Per Fraction: 1.8 Gy
Plan Total Fractions Prescribed: 25
Plan Total Prescribed Dose: 45 Gy
Reference Point Dosage Given to Date: 27 Gy
Reference Point Session Dosage Given: 1.8 Gy
Session Number: 15

## 2023-07-04 ENCOUNTER — Ambulatory Visit
Admission: RE | Admit: 2023-07-04 | Discharge: 2023-07-04 | Disposition: A | Discharge status: Home / Self Care | Source: Ambulatory Visit | Attending: Radiation Oncology | Admitting: Radiation Oncology

## 2023-07-04 ENCOUNTER — Other Ambulatory Visit: Payer: Self-pay

## 2023-07-04 DIAGNOSIS — Z510 Encounter for antineoplastic radiation therapy: Secondary | ICD-10-CM | POA: Diagnosis not present

## 2023-07-04 DIAGNOSIS — C541 Malignant neoplasm of endometrium: Secondary | ICD-10-CM | POA: Diagnosis not present

## 2023-07-04 LAB — RAD ONC ARIA SESSION SUMMARY
Course Elapsed Days: 22
Plan Fractions Treated to Date: 16
Plan Prescribed Dose Per Fraction: 1.8 Gy
Plan Total Fractions Prescribed: 25
Plan Total Prescribed Dose: 45 Gy
Reference Point Dosage Given to Date: 28.8 Gy
Reference Point Session Dosage Given: 1.8 Gy
Session Number: 16

## 2023-07-05 ENCOUNTER — Other Ambulatory Visit: Payer: Self-pay

## 2023-07-05 ENCOUNTER — Ambulatory Visit
Admission: RE | Admit: 2023-07-05 | Discharge: 2023-07-05 | Disposition: A | Discharge status: Home / Self Care | Source: Ambulatory Visit | Attending: Radiation Oncology | Admitting: Radiation Oncology

## 2023-07-05 DIAGNOSIS — Z510 Encounter for antineoplastic radiation therapy: Secondary | ICD-10-CM | POA: Diagnosis not present

## 2023-07-05 DIAGNOSIS — C541 Malignant neoplasm of endometrium: Secondary | ICD-10-CM | POA: Diagnosis not present

## 2023-07-05 LAB — RAD ONC ARIA SESSION SUMMARY
Course Elapsed Days: 23
Plan Fractions Treated to Date: 17
Plan Prescribed Dose Per Fraction: 1.8 Gy
Plan Total Fractions Prescribed: 25
Plan Total Prescribed Dose: 45 Gy
Reference Point Dosage Given to Date: 30.6 Gy
Reference Point Session Dosage Given: 1.8 Gy
Session Number: 17

## 2023-07-06 ENCOUNTER — Other Ambulatory Visit: Payer: Self-pay

## 2023-07-06 ENCOUNTER — Ambulatory Visit
Admission: RE | Admit: 2023-07-06 | Discharge: 2023-07-06 | Disposition: A | Discharge status: Home / Self Care | Source: Ambulatory Visit | Attending: Radiation Oncology | Admitting: Radiation Oncology

## 2023-07-06 DIAGNOSIS — C541 Malignant neoplasm of endometrium: Secondary | ICD-10-CM | POA: Diagnosis not present

## 2023-07-06 DIAGNOSIS — Z510 Encounter for antineoplastic radiation therapy: Secondary | ICD-10-CM | POA: Diagnosis not present

## 2023-07-06 LAB — RAD ONC ARIA SESSION SUMMARY
Course Elapsed Days: 24
Plan Fractions Treated to Date: 18
Plan Prescribed Dose Per Fraction: 1.8 Gy
Plan Total Fractions Prescribed: 25
Plan Total Prescribed Dose: 45 Gy
Reference Point Dosage Given to Date: 32.4 Gy
Reference Point Session Dosage Given: 1.8 Gy
Session Number: 18

## 2023-07-07 ENCOUNTER — Other Ambulatory Visit: Payer: Self-pay

## 2023-07-07 ENCOUNTER — Ambulatory Visit
Admission: RE | Admit: 2023-07-07 | Discharge: 2023-07-07 | Disposition: A | Discharge status: Home / Self Care | Source: Ambulatory Visit | Attending: Radiation Oncology | Admitting: Radiation Oncology

## 2023-07-07 DIAGNOSIS — C541 Malignant neoplasm of endometrium: Secondary | ICD-10-CM | POA: Diagnosis not present

## 2023-07-07 DIAGNOSIS — Z510 Encounter for antineoplastic radiation therapy: Secondary | ICD-10-CM | POA: Diagnosis not present

## 2023-07-07 LAB — RAD ONC ARIA SESSION SUMMARY
Course Elapsed Days: 25
Plan Fractions Treated to Date: 19
Plan Prescribed Dose Per Fraction: 1.8 Gy
Plan Total Fractions Prescribed: 25
Plan Total Prescribed Dose: 45 Gy
Reference Point Dosage Given to Date: 34.2 Gy
Reference Point Session Dosage Given: 1.8 Gy
Session Number: 19

## 2023-07-10 ENCOUNTER — Ambulatory Visit
Admission: RE | Admit: 2023-07-10 | Discharge: 2023-07-10 | Disposition: A | Discharge status: Home / Self Care | Source: Ambulatory Visit | Attending: Radiation Oncology | Admitting: Radiation Oncology

## 2023-07-10 ENCOUNTER — Other Ambulatory Visit: Payer: Self-pay

## 2023-07-10 DIAGNOSIS — Z510 Encounter for antineoplastic radiation therapy: Secondary | ICD-10-CM | POA: Diagnosis not present

## 2023-07-10 DIAGNOSIS — C541 Malignant neoplasm of endometrium: Secondary | ICD-10-CM | POA: Diagnosis not present

## 2023-07-10 LAB — RAD ONC ARIA SESSION SUMMARY
Course Elapsed Days: 28
Plan Fractions Treated to Date: 20
Plan Prescribed Dose Per Fraction: 1.8 Gy
Plan Total Fractions Prescribed: 25
Plan Total Prescribed Dose: 45 Gy
Reference Point Dosage Given to Date: 36 Gy
Reference Point Session Dosage Given: 1.8 Gy
Session Number: 20

## 2023-07-11 ENCOUNTER — Other Ambulatory Visit: Payer: Self-pay

## 2023-07-11 ENCOUNTER — Ambulatory Visit
Admission: RE | Admit: 2023-07-11 | Discharge: 2023-07-11 | Disposition: A | Discharge status: Home / Self Care | Source: Ambulatory Visit | Attending: Radiation Oncology | Admitting: Radiation Oncology

## 2023-07-11 DIAGNOSIS — Z510 Encounter for antineoplastic radiation therapy: Secondary | ICD-10-CM | POA: Diagnosis not present

## 2023-07-11 DIAGNOSIS — C541 Malignant neoplasm of endometrium: Secondary | ICD-10-CM | POA: Diagnosis not present

## 2023-07-11 LAB — RAD ONC ARIA SESSION SUMMARY
Course Elapsed Days: 29
Plan Fractions Treated to Date: 21
Plan Prescribed Dose Per Fraction: 1.8 Gy
Plan Total Fractions Prescribed: 25
Plan Total Prescribed Dose: 45 Gy
Reference Point Dosage Given to Date: 37.8 Gy
Reference Point Session Dosage Given: 1.8 Gy
Session Number: 21

## 2023-07-12 ENCOUNTER — Ambulatory Visit
Admission: RE | Admit: 2023-07-12 | Discharge: 2023-07-12 | Disposition: A | Discharge status: Home / Self Care | Source: Ambulatory Visit | Attending: Radiation Oncology | Admitting: Radiation Oncology

## 2023-07-12 ENCOUNTER — Other Ambulatory Visit: Payer: Self-pay

## 2023-07-12 DIAGNOSIS — Z510 Encounter for antineoplastic radiation therapy: Secondary | ICD-10-CM | POA: Diagnosis not present

## 2023-07-12 DIAGNOSIS — C541 Malignant neoplasm of endometrium: Secondary | ICD-10-CM | POA: Diagnosis not present

## 2023-07-12 LAB — RAD ONC ARIA SESSION SUMMARY
Course Elapsed Days: 30
Plan Fractions Treated to Date: 22
Plan Prescribed Dose Per Fraction: 1.8 Gy
Plan Total Fractions Prescribed: 25
Plan Total Prescribed Dose: 45 Gy
Reference Point Dosage Given to Date: 39.6 Gy
Reference Point Session Dosage Given: 1.8 Gy
Session Number: 22

## 2023-07-13 ENCOUNTER — Other Ambulatory Visit: Payer: Self-pay

## 2023-07-13 ENCOUNTER — Inpatient Hospital Stay

## 2023-07-13 ENCOUNTER — Ambulatory Visit
Admission: RE | Admit: 2023-07-13 | Discharge: 2023-07-13 | Disposition: A | Discharge status: Home / Self Care | Source: Ambulatory Visit | Attending: Radiation Oncology | Admitting: Radiation Oncology

## 2023-07-13 DIAGNOSIS — Z510 Encounter for antineoplastic radiation therapy: Secondary | ICD-10-CM | POA: Diagnosis not present

## 2023-07-13 DIAGNOSIS — C541 Malignant neoplasm of endometrium: Secondary | ICD-10-CM | POA: Diagnosis not present

## 2023-07-13 LAB — RAD ONC ARIA SESSION SUMMARY
Course Elapsed Days: 31
Plan Fractions Treated to Date: 23
Plan Prescribed Dose Per Fraction: 1.8 Gy
Plan Total Fractions Prescribed: 25
Plan Total Prescribed Dose: 45 Gy
Reference Point Dosage Given to Date: 41.4 Gy
Reference Point Session Dosage Given: 1.8 Gy
Session Number: 23

## 2023-07-14 ENCOUNTER — Other Ambulatory Visit: Payer: Self-pay

## 2023-07-14 ENCOUNTER — Ambulatory Visit
Admission: RE | Admit: 2023-07-14 | Discharge: 2023-07-14 | Disposition: A | Discharge status: Home / Self Care | Source: Ambulatory Visit | Attending: Radiation Oncology | Admitting: Radiation Oncology

## 2023-07-14 DIAGNOSIS — C541 Malignant neoplasm of endometrium: Secondary | ICD-10-CM | POA: Diagnosis not present

## 2023-07-14 DIAGNOSIS — Z510 Encounter for antineoplastic radiation therapy: Secondary | ICD-10-CM | POA: Diagnosis not present

## 2023-07-14 LAB — RAD ONC ARIA SESSION SUMMARY
Course Elapsed Days: 32
Plan Fractions Treated to Date: 24
Plan Prescribed Dose Per Fraction: 1.8 Gy
Plan Total Fractions Prescribed: 25
Plan Total Prescribed Dose: 45 Gy
Reference Point Dosage Given to Date: 43.2 Gy
Reference Point Session Dosage Given: 1.8 Gy
Session Number: 24

## 2023-07-17 ENCOUNTER — Ambulatory Visit
Admission: RE | Admit: 2023-07-17 | Discharge: 2023-07-17 | Disposition: A | Discharge status: Home / Self Care | Source: Ambulatory Visit | Attending: Radiation Oncology | Admitting: Radiation Oncology

## 2023-07-17 ENCOUNTER — Encounter: Payer: Self-pay | Admitting: Internal Medicine

## 2023-07-17 ENCOUNTER — Other Ambulatory Visit: Payer: Self-pay

## 2023-07-17 ENCOUNTER — Ambulatory Visit (INDEPENDENT_AMBULATORY_CARE_PROVIDER_SITE_OTHER): Admitting: Internal Medicine

## 2023-07-17 ENCOUNTER — Ambulatory Visit: Admitting: Internal Medicine

## 2023-07-17 VITALS — BP 120/60 | HR 77 | Ht 63.0 in | Wt 193.2 lb

## 2023-07-17 DIAGNOSIS — E559 Vitamin D deficiency, unspecified: Secondary | ICD-10-CM | POA: Diagnosis not present

## 2023-07-17 DIAGNOSIS — I10 Essential (primary) hypertension: Secondary | ICD-10-CM | POA: Diagnosis not present

## 2023-07-17 DIAGNOSIS — E782 Mixed hyperlipidemia: Secondary | ICD-10-CM

## 2023-07-17 DIAGNOSIS — M05711 Rheumatoid arthritis with rheumatoid factor of right shoulder without organ or systems involvement: Secondary | ICD-10-CM

## 2023-07-17 DIAGNOSIS — Z510 Encounter for antineoplastic radiation therapy: Secondary | ICD-10-CM | POA: Diagnosis not present

## 2023-07-17 DIAGNOSIS — E039 Hypothyroidism, unspecified: Secondary | ICD-10-CM

## 2023-07-17 DIAGNOSIS — C541 Malignant neoplasm of endometrium: Secondary | ICD-10-CM | POA: Diagnosis not present

## 2023-07-17 LAB — RAD ONC ARIA SESSION SUMMARY
Course Elapsed Days: 35
Plan Fractions Treated to Date: 25
Plan Prescribed Dose Per Fraction: 1.8 Gy
Plan Total Fractions Prescribed: 25
Plan Total Prescribed Dose: 45 Gy
Reference Point Dosage Given to Date: 45 Gy
Reference Point Session Dosage Given: 1.8 Gy
Session Number: 25

## 2023-07-17 NOTE — Progress Notes (Signed)
 Established Patient Office Visit  Subjective:  Patient ID: Kimberly Burke, female    DOB: 1940-12-28  Age: 83 y.o. MRN: [id number]  Chief Complaint  Patient presents with   Follow-up    3 month follow up    Patient comes in for her follow-up today.  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  Today was her last treatment.  She tolerated them well, except for occasional loose stools for which she has been advised to take OTC Imodium.  Generally she is feeling well and has no other complaints.  Will return fasting for blood work.    No other concerns at this time.   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 Caye at [facility]    CORONARY STENT INTERVENTION N/A 12/04/2018   Procedure: CORONARY STENT INTERVENTION;  Surgeon: Florencio Cara BIRCH, MD;  Location: [facility] INVASIVE CV LAB;  Service: Cardiovascular;  Laterality: N/A;   LEFT HEART CATH AND CORONARY ANGIOGRAPHY N/A 12/04/2018   Procedure: LEFT HEART CATH AND CORONARY ANGIOGRAPHY;  Surgeon: Fernand Denyse LABOR, MD;  Location: [facility] INVASIVE CV [facility];  Service: Cardiovascular;  Laterality: N/A;    Social History   Socioeconomic History   Marital status: Widowed    Spouse name: Not on file   Number of children: Not on file   Years of education: Not on file   Highest education level: Not on file  Occupational History   Not on file  Tobacco Use   Smoking status: Former   Smokeless tobacco: Never  Vaping Use   Vaping status: Never Used  Substance and Sexual Activity    Alcohol use: Never   Drug use: Never   Sexual activity: Not Currently  Other Topics Concern   Not on file  Social History Narrative   Lives with daughter   Social Drivers of Health   Financial Resource Strain: Low Risk  (09/02/2021)   Overall Financial Resource Strain (CARDIA)    Difficulty of Paying Living Expenses: Not hard at all  Food Insecurity: No Food Insecurity (09/09/2021)   Hunger Vital Sign    Worried About Running Out of Food in the Last Year: Never true    Ran Out of Food in the Last Year: Never true  Transportation Needs: No Transportation Needs (09/09/2021)   PRAPARE - Administrator, Civil Service (Medical): No    Lack of Transportation (Non-Medical): No  Physical Activity: Insufficiently Active (09/02/2021)   Exercise Vital Sign    Days of Exercise per Week: 5 days    Minutes of Exercise per Session: 10 min  Stress: No Stress Concern Present (09/02/2021)   [employer] of Occupational Health - Occupational Stress Questionnaire    Feeling of Stress : Not at all  Social Connections: Moderately Integrated (09/02/2021)   Social Connection and Isolation Panel    Frequency of Communication with Friends and Family: More than three times a week    Frequency of Social Gatherings with Friends and Family: More than three  times a week    Attends Religious Services: More than 4 times per year    Active Member of Clubs or Organizations: Yes    Attends Banker Meetings: More than 4 times per year    Marital Status: Widowed  Catering manager Violence: Not on file    Family History  Problem Relation Age of Onset   Cancer Brother        prostate or colon unsure which    Breast cancer Neg Hx     Allergies  Allergen Reactions   Codeine Itching   Penicillins Other (See Comments)    unknown    Outpatient Medications Prior to Visit  Medication Sig   acetaminophen  (TYLENOL ) 650 MG CR tablet Take 1,300 mg by mouth daily.   aspirin  81 MG chewable  tablet Chew 1 tablet (81 mg total) by mouth daily.   BRILINTA  60 MG TABS tablet TAKE 1 TABLET(60 MG) BY MOUTH TWICE DAILY   celecoxib  (CELEBREX ) 200 MG capsule Take 1 capsule (200 mg total) by mouth daily.   certolizumab pegol  (CIMZIA ) 2 X 200 MG/ML PSKT prefilled syringe Inject into the skin.   Cholecalciferol 1.25 MG (50000 UT) capsule Take by mouth.   DULoxetine  (CYMBALTA ) 30 MG capsule TAKE 1 CAPSULE BY MOUTH EVERY DAY FOR MOOD   fluticasone  (FLONASE ) 50 MCG/ACT nasal spray Place 1 spray into both nostrils daily.   folic acid  (FOLVITE ) 1 MG tablet Take 1 tablet (1 mg total) by mouth daily.   gabapentin (NEURONTIN) 100 MG capsule Take 100 mg by mouth 3 (three) times daily.   hydrochlorothiazide  (HYDRODIURIL ) 25 MG tablet TAKE 1 TABLET(25 MG) BY MOUTH DAILY   hydroxychloroquine (PLAQUENIL) 200 MG tablet Take by mouth.   levothyroxine  (SYNTHROID ) 50 MCG tablet Take 1 tablet (50 mcg total) by mouth daily.   losartan  (COZAAR ) 25 MG tablet Take 1 tablet (25 mg total) by mouth daily.   methotrexate (RHEUMATREX) 2.5 MG tablet Take 15 mg by mouth once a week.   metoprolol  succinate (TOPROL -XL) 100 MG 24 hr tablet TAKE 1 TABLET BY MOUTH EVERY DAY   metoprolol  succinate (TOPROL -XL) 50 MG 24 hr tablet TAKE 1 TABLET BY MOUTH EVERY DAY ALONG WITH 100 MG AS TOTAL 150 MG AS DIRECTED   baclofen  (LIORESAL ) 10 MG tablet Take 1 tablet (10 mg total) by mouth daily. (Patient not taking: Reported on 07/17/2023)   ondansetron  (ZOFRAN -ODT) 4 MG disintegrating tablet Take 1 tablet (4 mg total) by mouth every 8 (eight) hours as needed for nausea or vomiting. (Patient not taking: Reported on 07/17/2023)   pantoprazole  (PROTONIX ) 40 MG tablet Take 1 tablet (40 mg total) by mouth daily. (Patient not taking: Reported on 07/17/2023)   rosuvastatin  (CRESTOR ) 40 MG tablet TAKE 1 TABLET BY MOUTH EVERY DAY   No facility-administered medications prior to visit.    Review of Systems  Constitutional: Negative.  Negative for  chills, fever, malaise/fatigue and weight loss.  HENT: Negative.  Negative for sore throat.   Eyes: Negative.   Respiratory: Negative.  Negative for cough and shortness of breath.   Cardiovascular: Negative.  Negative for chest pain, palpitations and leg swelling.  Gastrointestinal: Negative.  Negative for abdominal pain, blood in stool, constipation, diarrhea, heartburn, melena, nausea and vomiting.  Genitourinary: Negative.  Negative for dysuria and flank pain.  Musculoskeletal: Negative.  Negative for joint pain and myalgias.  Skin: Negative.   Neurological: Negative.  Negative for dizziness, tingling, tremors, sensory change and headaches.  Endo/Heme/Allergies: Negative.  Psychiatric/Behavioral: Negative.  Negative for depression and suicidal ideas. The patient is not nervous/anxious.        Objective:   BP 120/60   Pulse 77   Ht 5' 3 (1.6 m)   Wt 193 lb 3.2 oz (87.6 kg)   SpO2 92%   BMI 34.22 kg/m   Vitals:   07/17/23 1415  BP: 120/60  Pulse: 77  Height: 5' 3 (1.6 m)  Weight: 193 lb 3.2 oz (87.6 kg)  SpO2: 92%  BMI (Calculated): 34.23    Physical Exam Vitals and nursing note reviewed.  Constitutional:      Appearance: Normal appearance.  HENT:     Head: Normocephalic and atraumatic.     Nose: Nose normal.     Mouth/Throat:     Mouth: Mucous membranes are moist.     Pharynx: Oropharynx is clear.   Eyes:     Conjunctiva/sclera: Conjunctivae normal.     Pupils: Pupils are equal, round, and reactive to light.    Cardiovascular:     Rate and Rhythm: Normal rate and regular rhythm.     Pulses: Normal pulses.     Heart sounds: Normal heart sounds. No murmur heard. Pulmonary:     Effort: Pulmonary effort is normal.     Breath sounds: Normal breath sounds. No wheezing.  Abdominal:     General: Bowel sounds are normal.     Palpations: Abdomen is soft.     Tenderness: There is no abdominal tenderness. There is no right CVA tenderness or left CVA tenderness.    Musculoskeletal:        General: Normal range of motion.     Cervical back: Normal range of motion.     Right lower leg: No edema.     Left lower leg: No edema.   Skin:    General: Skin is warm and dry.   Neurological:     General: No focal deficit present.     Mental Status: She is alert and oriented to person, place, and time.   Psychiatric:        Mood and Affect: Mood normal.        Behavior: Behavior normal.      Results for orders placed or performed in visit on 07/17/23  Rad [facility]c [facility] Session Summary  Result Value Ref Range   Course ID [email or web address]    Course Intent Curative    Course Start Date 05/31/2023    Session Number 25    Course First Treatment Date 06/12/2023 10:55 AM    Course Last Treatment Date 07/17/2023  9:54 AM    Course Elapsed Days 35    Reference Point ID Pelvis DP    Reference Point Dosage Given to Date 45 Gy   Reference Point Session Dosage Given 1.8 Gy   Plan ID Pelvis    Plan Name Pelvis    Plan Fractions Treated to Date 25    Plan Total Fractions Prescribed 25    Plan Prescribed Dose Per Fraction 1.8 Gy   Plan Total Prescribed Dose 45.000000 Gy   Plan Primary Reference Point Pelvis DP     Recent Results (from the past 2160 hours)  Surgical pathology     Status: None   Collection Time: 05/10/23 12:00 AM  Result Value Ref Range   SURGICAL PATHOLOGY      SURGICAL PATHOLOGY [facility] [address], [address] [place], [place] [address] Telephone [phone] or [phone] Fax [phone]  REPORT OF SURGICAL PATHOLOGY   Accession #: [id number] Patient Name: ATLEY, SCARBORO Visit # :   MRN: [id number] Physician: Dasie Tinnie JUDITHANN Carolyne [birth year] (Age: 14) Gender: F Collected Date: 05/10/2023 Received Date: 05/11/2023  FINAL DIAGNOSIS       1. Vagina, biopsy, vagina apex and posterior vagina wall :       - HIGH GRADE SEROUS CARCINOMA, SEE NOTE       Diagnosis Note : Immunohistochemical stains show that  the tumor cells are      positive for p16, p53 (clonal overexpression), CK7 and PAX8; while they are      negative for CK20 and ER. This immunoprofile is consistent with the above      interpretation.  Dr. Reed reviewed the case and concurred with the      interpretation. Dr. Dasie was notified on 05/16/2023.      DATE SIGNED OUT: 05/16/2023 ELECTRONIC SIGNATURE : Rebbecca Md,  Nilesh, Pathologist, Electronic Signature  MICROSCOPIC DESCRIPTION  CASE COMMENTS STAINS USED IN DIAGNOSIS: H&E Universal Negative Control-DAB Stains used in diagnosis 1 CK-7, 1 CK 20, 1 PAX 8, 1 ER - NOACIS, 1 P53, 1 P16 Some of these immunohistochemical stains may have been developed and the performance characteristics determined by [facility].  Some may not have been cleared or approved by the U.S. Food and Drug Administration.  The FDA has determined that such clearance or approval is not necessary.  This test is used for clinical purposes.  It should not be regarded as investigational or for research.  This laboratory is certified under the Clinical Laboratory Improvement Amendments of 1988 (CLIA-88) as qualified to perform high complexity clinical laboratory testing. IHC stains are performed on formalin fixed,paraffin embedded tissue using a 3,3diaminobenzidine (DAB) chromogen and Leica Bond Autostainer System. The staining intensity of the nucl eus is score manually and is reported as the percentage of tumor cell nuclei demonstrating specific nuclear staining. The specimens are fixed in 10% Neutral Formalin for at least 6 hours and up to 72hrs. These tests are validated on decalcified tissue. Results should be interpreted with caution given the possibility of false negative results on decalcified specimens. Antibody Clones are as follows ER-clone 61F, PR-clone 16, Ki67- clone MM1.Some of these immunohistochemical stains may have been developed and the performance characteristics  determined by [facility].    CLINICAL HISTORY  SPECIMEN(S) OBTAINED 1. Vagina, biopsy, Vagina Apex And Posterior Vagina Wall  SPECIMEN COMMENTS: SPECIMEN CLINICAL INFORMATION: 1. Abnormal vaginal bleeding in postmenopausal patient, primary, history of high grade endometrial cancer    Gross Description 1. Vaginal apex, received in formalin is a 1.2 x 1.0 x 0.2 cm aggregate of 4 tan-gray, unoriented tissue fragments w ith a scant amount of hemorrhagic discoloration. The specimen is submitted in toto in 1 block (1A).      [facility] 05/11/2023        Report signed out from the following location(s) [place] [address] [place], [place] [address] CLIA #: [id number]  [facility] [address] [place], [place] [address] CLIA #: [id number]   Glucose, capillary     Status: Abnormal   Collection Time: 05/23/23  8:53 AM  Result Value Ref Range   Glucose-Capillary 103 (H) 70 - 99 mg/dL    Comment: Glucose reference range applies only to samples taken after fasting for at least 8 hours.  Rad [employer] Session Summary     Status: None   Collection  Time: 06/12/23 11:00 AM  Result Value Ref Range   Course ID [email or web address]    Course Intent Curative    Course Start Date 05/31/2023    Session Number 1    Course First Treatment Date 06/12/2023 10:55 AM    Course Last Treatment Date 06/12/2023 10:58 AM    Course Elapsed Days 0    Reference Point ID Pelvis DP    Reference Point Dosage Given to Date 1.8 Gy   Reference Point Session Dosage Given 1.8 Gy   Plan ID Pelvis    Plan Name Pelvis    Plan Fractions Treated to Date 1    Plan Total Fractions Prescribed 25    Plan Prescribed Dose Per Fraction 1.8 Gy   Plan Total Prescribed Dose 45.000000 Gy   Plan Primary Reference Point Pelvis DP   Rad Onc [facility]ria Session Summary     Status: None   Collection Time: 06/13/23 11:06 AM  Result Value Ref Range   Course ID [email or web address]    Course Intent  Curative    Course Start Date 05/31/2023    Session Number 2    Course First Treatment Date 06/12/2023 10:55 AM    Course Last Treatment Date 06/13/2023 11:04 AM    Course Elapsed Days 1    Reference Point ID Pelvis DP    Reference Point Dosage Given to Date 3.6 Gy   Reference Point Session Dosage Given 1.8 Gy   Plan ID Pelvis    Plan Name Pelvis    Plan Fractions Treated to Date 2    Plan Total Fractions Prescribed 25    Plan Prescribed Dose Per Fraction 1.8 Gy   Plan Total Prescribed Dose 45.000000 Gy   Plan Primary Reference Point Pelvis DP   Rad Onc [facility]ria Session Summary     Status: None   Collection Time: 06/14/23 11:11 AM  Result Value Ref Range   Course ID [email or web address]    Course Intent Curative    Course Start Date 05/31/2023    Session Number 3    Course First Treatment Date 06/12/2023 10:55 AM    Course Last Treatment Date 06/14/2023 11:09 AM    Course Elapsed Days 2    Reference Point ID Pelvis DP    Reference Point Dosage Given to Date 5.4 Gy   Reference Point Session Dosage Given 1.8 Gy   Plan ID Pelvis    Plan Name Pelvis    Plan Fractions Treated to Date 3    Plan Total Fractions Prescribed 25    Plan Prescribed Dose Per Fraction 1.8 Gy   Plan Total Prescribed Dose 45.000000 Gy   Plan Primary Reference Point Pelvis DP   CBC (Cancer Center Only)     Status: Abnormal   Collection Time: 06/15/23 10:33 AM  Result Value Ref Range   WBC Count 5.4 4.0 - 10.5 K/uL   RBC 3.78 (L) 3.87 - 5.11 MIL/uL   Hemoglobin 11.9 (L) 12.0 - 15.0 g/dL   HCT 63.3 63.9 - 53.9 %   MCV 96.8 80.0 - 100.0 fL   MCH 31.5 26.0 - 34.0 pg   MCHC 32.5 30.0 - 36.0 g/dL   RDW 85.1 88.4 - 84.4 %   Platelet Count 223 150 - 400 K/uL   nRBC 0.0 0.0 - 0.2 %    Comment: Performed at [facility], [address] [place], [place] [address]  Rad [facility] Session Summary     Status: None   Collection Time: 06/15/23  10:52 AM  Result Value Ref Range   Course ID [email or web address]    Course Intent Curative     Course Start Date 05/31/2023    Session Number 4    Course First Treatment Date 06/12/2023 10:55 AM    Course Last Treatment Date 06/15/2023 10:50 AM    Course Elapsed Days 3    Reference Point ID Pelvis DP    Reference Point Dosage Given to Date 7.2 Gy   Reference Point Session Dosage Given 1.8 Gy   Plan ID Pelvis    Plan Name Pelvis    Plan Fractions Treated to Date 4    Plan Total Fractions Prescribed 25    Plan Prescribed Dose Per Fraction 1.8 Gy   Plan Total Prescribed Dose 45.000000 Gy   Plan Primary Reference Point Pelvis DP   Rad Onc [facility] Session Summary     Status: None   Collection Time: 06/16/23 11:08 AM  Result Value Ref Range   Course ID [email or web address]    Course Intent Curative    Course Start Date 05/31/2023    Session Number 5    Course First Treatment Date 06/12/2023 10:55 AM    Course Last Treatment Date 06/16/2023 11:06 AM    Course Elapsed Days 4    Reference Point ID Pelvis DP    Reference Point Dosage Given to Date 9 Gy   Reference Point Session Dosage Given 1.8 Gy   Plan ID Pelvis    Plan Name Pelvis    Plan Fractions Treated to Date 5    Plan Total Fractions Prescribed 25    Plan Prescribed Dose Per Fraction 1.8 Gy   Plan Total Prescribed Dose 45.000000 Gy   Plan Primary Reference Point Pelvis DP   Rad Onc [facility] Session Summary     Status: None   Collection Time: 06/20/23 11:16 AM  Result Value Ref Range   Course ID [email or web address]    Course Intent Curative    Course Start Date 05/31/2023    Session Number 6    Course First Treatment Date 06/12/2023 10:55 AM    Course Last Treatment Date 06/20/2023 11:14 AM    Course Elapsed Days 8    Reference Point ID Pelvis DP    Reference Point Dosage Given to Date 10.8 Gy   Reference Point Session Dosage Given 1.8 Gy   Plan ID Pelvis    Plan Name Pelvis    Plan Fractions Treated to Date 6    Plan Total Fractions Prescribed 25    Plan Prescribed Dose Per Fraction 1.8 Gy   Plan Total Prescribed Dose 45.000000 Gy   Plan Primary  Reference Point Pelvis DP   Rad Onc [facility]ria Session Summary     Status: None   Collection Time: 06/21/23 10:55 AM  Result Value Ref Range   Course ID [email or web address]    Course Intent Curative    Course Start Date 05/31/2023    Session Number 7    Course First Treatment Date 06/12/2023 10:55 AM    Course Last Treatment Date 06/21/2023 10:53 AM    Course Elapsed Days 9    Reference Point ID Pelvis DP    Reference Point Dosage Given to Date 12.6 Gy   Reference Point Session Dosage Given 1.8 Gy   Plan ID Pelvis    Plan Name Pelvis    Plan Fractions Treated to Date 7    Plan Total Fractions Prescribed 25    Plan Prescribed Dose Per Fraction 1.8 Gy  Plan Total Prescribed Dose 45.000000 Gy   Plan Primary Reference Point Pelvis DP   Rad Onc [facility] Session Summary     Status: None   Collection Time: 06/22/23 10:59 AM  Result Value Ref Range   Course ID [email or web address]    Course Intent Curative    Course Start Date 05/31/2023    Session Number 8    Course First Treatment Date 06/12/2023 10:55 AM    Course Last Treatment Date 06/22/2023 10:57 AM    Course Elapsed Days 10    Reference Point ID Pelvis DP    Reference Point Dosage Given to Date 14.4 Gy   Reference Point Session Dosage Given 1.8 Gy   Plan ID Pelvis    Plan Name Pelvis    Plan Fractions Treated to Date 8    Plan Total Fractions Prescribed 25    Plan Prescribed Dose Per Fraction 1.8 Gy   Plan Total Prescribed Dose 45.000000 Gy   Plan Primary Reference Point Pelvis DP   Rad Onc [facility] Session Summary     Status: None   Collection Time: 06/23/23 11:00 AM  Result Value Ref Range   Course ID [email or web address]    Course Intent Curative    Course Start Date 05/31/2023    Session Number 9    Course First Treatment Date 06/12/2023 10:55 AM    Course Last Treatment Date 06/23/2023 10:58 AM    Course Elapsed Days 11    Reference Point ID Pelvis DP    Reference Point Dosage Given to Date 16.2 Gy   Reference Point Session Dosage Given 1.8 Gy   Plan ID Pelvis     Plan Name Pelvis    Plan Fractions Treated to Date 9    Plan Total Fractions Prescribed 25    Plan Prescribed Dose Per Fraction 1.8 Gy   Plan Total Prescribed Dose 45.000000 Gy   Plan Primary Reference Point Pelvis DP   Rad Onc [facility] Session Summary     Status: None   Collection Time: 06/26/23 11:14 AM  Result Value Ref Range   Course ID [email or web address]    Course Intent Curative    Course Start Date 05/31/2023    Session Number 10    Course First Treatment Date 06/12/2023 10:55 AM    Course Last Treatment Date 06/26/2023 11:11 AM    Course Elapsed Days 14    Reference Point ID Pelvis DP    Reference Point Dosage Given to Date 18 Gy   Reference Point Session Dosage Given 1.8 Gy   Plan ID Pelvis    Plan Name Pelvis    Plan Fractions Treated to Date 10    Plan Total Fractions Prescribed 25    Plan Prescribed Dose Per Fraction 1.8 Gy   Plan Total Prescribed Dose 45.000000 Gy   Plan Primary Reference Point Pelvis DP   Rad Onc [facility] Session Summary     Status: None   Collection Time: 06/27/23 10:52 AM  Result Value Ref Range   Course ID [email or web address]    Course Intent Curative    Course Start Date 05/31/2023    Session Number 11    Course First Treatment Date 06/12/2023 10:55 AM    Course Last Treatment Date 06/27/2023 10:51 AM    Course Elapsed Days 15    Reference Point ID Pelvis DP    Reference Point Dosage Given to Date 19.8 Gy   Reference Point Session Dosage Given 1.8 Gy   Plan ID Pelvis  Plan Name Pelvis    Plan Fractions Treated to Date 45    Plan Total Fractions Prescribed 25    Plan Prescribed Dose Per Fraction 1.8 Gy   Plan Total Prescribed Dose 45.000000 Gy   Plan Primary Reference Point Pelvis DP   Rad Onc [facility]ria Session Summary     Status: None   Collection Time: 06/28/23 11:04 AM  Result Value Ref Range   Course ID [email or web address]    Course Intent Curative    Course Start Date 05/31/2023    Session Number 12    Course First Treatment Date 06/12/2023 10:55 AM    Course Last Treatment  Date 06/28/2023 11:02 AM    Course Elapsed Days 16    Reference Point ID Pelvis DP    Reference Point Dosage Given to Date 21.6 Gy   Reference Point Session Dosage Given 1.8 Gy   Plan ID Pelvis    Plan Name Pelvis    Plan Fractions Treated to Date 12    Plan Total Fractions Prescribed 25    Plan Prescribed Dose Per Fraction 1.8 Gy   Plan Total Prescribed Dose 45.000000 Gy   Plan Primary Reference Point Pelvis DP   Rad Onc [facility] Session Summary     Status: None   Collection Time: 06/29/23 10:58 AM  Result Value Ref Range   Course ID [email or web address]    Course Intent Curative    Course Start Date 05/31/2023    Session Number 13    Course First Treatment Date 06/12/2023 10:55 AM    Course Last Treatment Date 06/29/2023 10:56 AM    Course Elapsed Days 17    Reference Point ID Pelvis DP    Reference Point Dosage Given to Date 23.4 Gy   Reference Point Session Dosage Given 1.8 Gy   Plan ID Pelvis    Plan Name Pelvis    Plan Fractions Treated to Date 76    Plan Total Fractions Prescribed 25    Plan Prescribed Dose Per Fraction 1.8 Gy   Plan Total Prescribed Dose 45.000000 Gy   Plan Primary Reference Point Pelvis DP   Rad Onc [facility] Session Summary     Status: None   Collection Time: 06/30/23 10:56 AM  Result Value Ref Range   Course ID [email or web address]    Course Intent Curative    Course Start Date 05/31/2023    Session Number 14    Course First Treatment Date 06/12/2023 10:55 AM    Course Last Treatment Date 06/30/2023 10:54 AM    Course Elapsed Days 18    Reference Point ID Pelvis DP    Reference Point Dosage Given to Date 25.2 Gy   Reference Point Session Dosage Given 1.8 Gy   Plan ID Pelvis    Plan Name Pelvis    Plan Fractions Treated to Date 14    Plan Total Fractions Prescribed 25    Plan Prescribed Dose Per Fraction 1.8 Gy   Plan Total Prescribed Dose 45.000000 Gy   Plan Primary Reference Point Pelvis DP   Rad Onc [facility] Session Summary     Status: None   Collection Time: 07/03/23 11:09 AM   Result Value Ref Range   Course ID [email or web address]    Course Intent Curative    Course Start Date 05/31/2023    Session Number 15    Course First Treatment Date 06/12/2023 10:55 AM    Course Last Treatment Date 07/03/2023 11:07 AM    Course Elapsed Days 21  Reference Point ID Pelvis DP    Reference Point Dosage Given to Date 27 Gy   Reference Point Session Dosage Given 1.8 Gy   Plan ID Pelvis    Plan Name Pelvis    Plan Fractions Treated to Date 15    Plan Total Fractions Prescribed 25    Plan Prescribed Dose Per Fraction 1.8 Gy   Plan Total Prescribed Dose 45.000000 Gy   Plan Primary Reference Point Pelvis DP   Rad Onc [facility]ria Session Summary     Status: None   Collection Time: 07/04/23 11:13 AM  Result Value Ref Range   Course ID [email or web address]    Course Intent Curative    Course Start Date 05/31/2023    Session Number 16    Course First Treatment Date 06/12/2023 10:55 AM    Course Last Treatment Date 07/04/2023 11:11 AM    Course Elapsed Days 22    Reference Point ID Pelvis DP    Reference Point Dosage Given to Date 28.8 Gy   Reference Point Session Dosage Given 1.8 Gy   Plan ID Pelvis    Plan Name Pelvis    Plan Fractions Treated to Date 16    Plan Total Fractions Prescribed 25    Plan Prescribed Dose Per Fraction 1.8 Gy   Plan Total Prescribed Dose 45.000000 Gy   Plan Primary Reference Point Pelvis DP   Rad Onc Aria Session Summary     Status: None   Collection Time: 07/05/23 11:01 AM  Result Value Ref Range   Course ID [email or web address]    Course Intent Curative    Course Start Date 05/31/2023    Session Number 17    Course First Treatment Date 06/12/2023 10:55 AM    Course Last Treatment Date 07/05/2023 10:59 AM    Course Elapsed Days 23    Reference Point ID Pelvis DP    Reference Point Dosage Given to Date 30.6 Gy   Reference Point Session Dosage Given 1.8 Gy   Plan ID Pelvis    Plan Name Pelvis    Plan Fractions Treated to Date 17    Plan Total Fractions Prescribed 25    Plan  Prescribed Dose Per Fraction 1.8 Gy   Plan Total Prescribed Dose 45.000000 Gy   Plan Primary Reference Point Pelvis DP   Rad Onc Aria Session Summary     Status: None   Collection Time: 07/06/23 11:09 AM  Result Value Ref Range   Course ID [email or web address]    Course Intent Curative    Course Start Date 05/31/2023    Session Number 18    Course First Treatment Date 06/12/2023 10:55 AM    Course Last Treatment Date 07/06/2023 11:06 AM    Course Elapsed Days 24    Reference Point ID Pelvis DP    Reference Point Dosage Given to Date 32.4 Gy   Reference Point Session Dosage Given 1.8 Gy   Plan ID Pelvis    Plan Name Pelvis    Plan Fractions Treated to Date 70    Plan Total Fractions Prescribed 25    Plan Prescribed Dose Per Fraction 1.8 Gy   Plan Total Prescribed Dose 45.000000 Gy   Plan Primary Reference Point Pelvis DP   Rad Onc Aria Session Summary     Status: None   Collection Time: 07/07/23 11:01 AM  Result Value Ref Range   Course ID [email or web address]    Course Intent Curative    Course Start Date 05/31/2023  Session Number 19    Course First Treatment Date 06/12/2023 10:55 AM    Course Last Treatment Date 07/07/2023 10:59 AM    Course Elapsed Days 25    Reference Point ID Pelvis DP    Reference Point Dosage Given to Date 34.2 Gy   Reference Point Session Dosage Given 1.8 Gy   Plan ID Pelvis    Plan Name Pelvis    Plan Fractions Treated to Date 107    Plan Total Fractions Prescribed 25    Plan Prescribed Dose Per Fraction 1.8 Gy   Plan Total Prescribed Dose 45.000000 Gy   Plan Primary Reference Point Pelvis DP   Rad Onc Aria Session Summary     Status: None   Collection Time: 07/10/23 11:02 AM  Result Value Ref Range   Course ID [email or web address]    Course Intent Curative    Course Start Date 05/31/2023    Session Number 20    Course First Treatment Date 06/12/2023 10:55 AM    Course Last Treatment Date 07/10/2023 11:00 AM    Course Elapsed Days 28    Reference Point ID Pelvis DP    Reference  Point Dosage Given to Date 36 Gy   Reference Point Session Dosage Given 1.8 Gy   Plan ID Pelvis    Plan Name Pelvis    Plan Fractions Treated to Date 20    Plan Total Fractions Prescribed 25    Plan Prescribed Dose Per Fraction 1.8 Gy   Plan Total Prescribed Dose 45.000000 Gy   Plan Primary Reference Point Pelvis DP   Rad Onc Aria Session Summary     Status: None   Collection Time: 07/11/23 11:11 AM  Result Value Ref Range   Course ID [email or web address]    Course Intent Curative    Course Start Date 05/31/2023    Session Number 21    Course First Treatment Date 06/12/2023 10:55 AM    Course Last Treatment Date 07/11/2023 11:09 AM    Course Elapsed Days 29    Reference Point ID Pelvis DP    Reference Point Dosage Given to Date 37.8 Gy   Reference Point Session Dosage Given 1.8 Gy   Plan ID Pelvis    Plan Name Pelvis    Plan Fractions Treated to Date 28    Plan Total Fractions Prescribed 25    Plan Prescribed Dose Per Fraction 1.8 Gy   Plan Total Prescribed Dose 45.000000 Gy   Plan Primary Reference Point Pelvis DP   Rad Onc Aria Session Summary     Status: None   Collection Time: 07/12/23 10:57 AM  Result Value Ref Range   Course ID [email or web address]    Course Intent Curative    Course Start Date 05/31/2023    Session Number 22    Course First Treatment Date 06/12/2023 10:55 AM    Course Last Treatment Date 07/12/2023 10:55 AM    Course Elapsed Days 30    Reference Point ID Pelvis DP    Reference Point Dosage Given to Date 39.6 Gy   Reference Point Session Dosage Given 1.8 Gy   Plan ID Pelvis    Plan Name Pelvis    Plan Fractions Treated to Date 22    Plan Total Fractions Prescribed 25    Plan Prescribed Dose Per Fraction 1.8 Gy   Plan Total Prescribed Dose 45.000000 Gy   Plan Primary Reference Point Pelvis DP   Rad Onc Aria Session Summary     Status: None  Collection Time: 07/13/23 11:10 AM  Result Value Ref Range   Course ID [email or web address]    Course Intent Curative    Course Start Date  05/31/2023    Session Number 23    Course First Treatment Date 06/12/2023 10:55 AM    Course Last Treatment Date 07/13/2023 11:09 AM    Course Elapsed Days 31    Reference Point ID Pelvis DP    Reference Point Dosage Given to Date 41.4 Gy   Reference Point Session Dosage Given 1.8 Gy   Plan ID Pelvis    Plan Name Pelvis    Plan Fractions Treated to Date 23    Plan Total Fractions Prescribed 25    Plan Prescribed Dose Per Fraction 1.8 Gy   Plan Total Prescribed Dose 45.000000 Gy   Plan Primary Reference Point Pelvis DP   Rad Onc Aria Session Summary     Status: None   Collection Time: 07/14/23 11:05 AM  Result Value Ref Range   Course ID [email or web address]    Course Intent Curative    Course Start Date 05/31/2023    Session Number 24    Course First Treatment Date 06/12/2023 10:55 AM    Course Last Treatment Date 07/14/2023 11:03 AM    Course Elapsed Days 32    Reference Point ID Pelvis DP    Reference Point Dosage Given to Date 43.2 Gy   Reference Point Session Dosage Given 1.8 Gy   Plan ID Pelvis    Plan Name Pelvis    Plan Fractions Treated to Date 24    Plan Total Fractions Prescribed 25    Plan Prescribed Dose Per Fraction 1.8 Gy   Plan Total Prescribed Dose 45.000000 Gy   Plan Primary Reference Point Pelvis DP   Rad Onc Aria Session Summary     Status: None   Collection Time: 07/17/23  9:56 AM  Result Value Ref Range   Course ID [email or web address]    Course Intent Curative    Course Start Date 05/31/2023    Session Number 25    Course First Treatment Date 06/12/2023 10:55 AM    Course Last Treatment Date 07/17/2023  9:54 AM    Course Elapsed Days 35    Reference Point ID Pelvis DP    Reference Point Dosage Given to Date 45 Gy   Reference Point Session Dosage Given 1.8 Gy   Plan ID Pelvis    Plan Name Pelvis    Plan Fractions Treated to Date 25    Plan Total Fractions Prescribed 25    Plan Prescribed Dose Per Fraction 1.8 Gy   Plan Total Prescribed Dose 45.000000 Gy   Plan Primary  Reference Point Pelvis DP       Assessment & Plan:  Continue all medications.  Return fasting for blood work. Problem List Items Addressed This Visit     Hypothyroid   Relevant Orders   TSH+T4F+T3Free   Essential hypertension, benign - Primary   Relevant Orders   CMP14+EGFR   Rheumatoid arthritis (HCC)   Relevant Orders   CBC with Diff   Mixed hyperlipidemia   Relevant Orders   Lipid Panel w/o Chol/HDL Ratio   Other Visit Diagnoses       Vitamin D  deficiency           Return in about 3 months (around 10/17/2023).   Total time spent: 30 minutes  FERNAND FREDY RAMAN, MD  07/17/2023   This document may have been prepared by [facility] Voice Recognition software  and as such may include unintentional dictation errors.

## 2023-07-18 DIAGNOSIS — C541 Malignant neoplasm of endometrium: Secondary | ICD-10-CM | POA: Diagnosis not present

## 2023-07-19 DIAGNOSIS — M069 Rheumatoid arthritis, unspecified: Secondary | ICD-10-CM | POA: Diagnosis not present

## 2023-07-26 ENCOUNTER — Other Ambulatory Visit: Payer: Self-pay

## 2023-07-26 ENCOUNTER — Ambulatory Visit
Admission: RE | Admit: 2023-07-26 | Discharge: 2023-07-26 | Disposition: A | Discharge status: Home / Self Care | Source: Ambulatory Visit | Attending: Radiation Oncology | Admitting: Radiation Oncology

## 2023-07-26 DIAGNOSIS — C541 Malignant neoplasm of endometrium: Secondary | ICD-10-CM | POA: Insufficient documentation

## 2023-07-26 LAB — RAD ONC ARIA SESSION SUMMARY
Course Elapsed Days: 44
Plan Fractions Treated to Date: 1
Plan Prescribed Dose Per Fraction: 4 Gy
Plan Total Fractions Prescribed: 3
Plan Total Prescribed Dose: 12 Gy
Reference Point Dosage Given to Date: 1.1377 Gy
Reference Point Dosage Given to Date: 1.7563 Gy
Reference Point Dosage Given to Date: 4 Gy
Reference Point Dosage Given to Date: 4.9714 Gy
Reference Point Dosage Given to Date: 7.089 Gy
Reference Point Session Dosage Given: 1.1377 Gy
Reference Point Session Dosage Given: 1.7563 Gy
Reference Point Session Dosage Given: 4 Gy
Reference Point Session Dosage Given: 4.9714 Gy
Reference Point Session Dosage Given: 7.089 Gy
Session Number: 26

## 2023-07-31 ENCOUNTER — Ambulatory Visit
Admission: RE | Admit: 2023-07-31 | Discharge: 2023-07-31 | Disposition: A | Discharge status: Home / Self Care | Source: Ambulatory Visit | Attending: Radiation Oncology | Admitting: Radiation Oncology

## 2023-07-31 ENCOUNTER — Other Ambulatory Visit: Payer: Self-pay

## 2023-07-31 DIAGNOSIS — C541 Malignant neoplasm of endometrium: Secondary | ICD-10-CM | POA: Diagnosis not present

## 2023-07-31 LAB — RAD ONC ARIA SESSION SUMMARY
Course Elapsed Days: 49
Plan Fractions Treated to Date: 2
Plan Prescribed Dose Per Fraction: 4 Gy
Plan Total Fractions Prescribed: 3
Plan Total Prescribed Dose: 12 Gy
Reference Point Dosage Given to Date: 14.1779 Gy
Reference Point Dosage Given to Date: 2.2754 Gy
Reference Point Dosage Given to Date: 3.5126 Gy
Reference Point Dosage Given to Date: 8 Gy
Reference Point Dosage Given to Date: 9.9427 Gy
Reference Point Session Dosage Given: 1.1377 Gy
Reference Point Session Dosage Given: 1.7563 Gy
Reference Point Session Dosage Given: 4 Gy
Reference Point Session Dosage Given: 4.9714 Gy
Reference Point Session Dosage Given: 7.089 Gy
Session Number: 27

## 2023-08-03 ENCOUNTER — Ambulatory Visit
Admission: RE | Admit: 2023-08-03 | Discharge: 2023-08-03 | Disposition: A | Discharge status: Home / Self Care | Source: Ambulatory Visit | Attending: Radiation Oncology | Admitting: Radiation Oncology

## 2023-08-03 ENCOUNTER — Other Ambulatory Visit: Payer: Self-pay

## 2023-08-03 DIAGNOSIS — C541 Malignant neoplasm of endometrium: Secondary | ICD-10-CM | POA: Diagnosis not present

## 2023-08-03 LAB — RAD ONC ARIA SESSION SUMMARY
Course Elapsed Days: 52
Plan Fractions Treated to Date: 3
Plan Prescribed Dose Per Fraction: 4 Gy
Plan Total Fractions Prescribed: 3
Plan Total Prescribed Dose: 12 Gy
Reference Point Dosage Given to Date: 12 Gy
Reference Point Dosage Given to Date: 14.9141 Gy
Reference Point Dosage Given to Date: 21.2669 Gy
Reference Point Dosage Given to Date: 3.4131 Gy
Reference Point Dosage Given to Date: 5.269 Gy
Reference Point Session Dosage Given: 1.1377 Gy
Reference Point Session Dosage Given: 1.7563 Gy
Reference Point Session Dosage Given: 4 Gy
Reference Point Session Dosage Given: 4.9714 Gy
Reference Point Session Dosage Given: 7.089 Gy
Session Number: 28

## 2023-08-04 NOTE — Radiation Completion Notes (Signed)
 Patient Name: Kimberly Burke, Kimberly Burke MRN: [id number] Date of Birth: 11-12-1940 Referring Physician: FREDY BATHE, M.D. Date of Service: 2023-08-04 Radiation Oncologist: Marcey Penton, M.D. 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  Mismatch repair was intact..  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  I recommended in consultation with GYN oncology to proceed with external beam pelvic radiation therapy plus vaginal brachytherapy.  She had a PET scan which I ordered and have reviewed showing no areas of abnormal radiotracer uptake to suggest recurrent or metastatic disease.  There was some uptake along the anorectal region although there was a significant mount of stool in her rectum.  She is seen today and is doing well she has no complaints.      ==========DELIVERED PLANS==========  First Treatment Date: 2023-06-12 Last Treatment Date: 2023-08-03   Plan Name: Pelvis Site: Pelvis Technique: IMRT Mode: Photon Dose Per Fraction: 1.8 Gy Prescribed Dose (Delivered / Prescribed): 45 Gy / 45 Gy Prescribed Fxs (Delivered / Prescribed): 25 / 25   Plan Name: Vag_[id number] Site: Vagina Technique: HDR Ir-192 Mode: Brachytherapy Dose Per Fraction: 4 Gy Prescribed Dose (Delivered / Prescribed): 12 Gy / 12 Gy Prescribed Fxs (Delivered / Prescribed): 3 / 3     ==========ON TREATMENT VISIT DATES========== 2023-06-13, 2023-06-20, 2023-06-27, 2023-07-04,  2023-07-11, 2023-07-26, 2023-07-31, 2023-08-03, 2023-08-03     ==========UPCOMING VISITS========== 10/17/2023 [facility] OFFICE VISIT BATHE FREDY RAMAN, MD  09/14/2023 [facility] RAD ONCOLOGY FOLLOW UP 30 Penton Marcey, MD  09/06/2023 [facility] EST PT [facility]        ==========APPENDIX - ON TREATMENT VISIT NOTES==========   See weekly On Treatment Notes in Epic for details in the Media tab (listed as Progress notes on the On Treatment Visit Dates listed above).

## 2023-08-08 ENCOUNTER — Other Ambulatory Visit

## 2023-08-08 DIAGNOSIS — M05711 Rheumatoid arthritis with rheumatoid factor of right shoulder without organ or systems involvement: Secondary | ICD-10-CM | POA: Diagnosis not present

## 2023-08-08 DIAGNOSIS — E039 Hypothyroidism, unspecified: Secondary | ICD-10-CM | POA: Diagnosis not present

## 2023-08-08 DIAGNOSIS — E782 Mixed hyperlipidemia: Secondary | ICD-10-CM | POA: Diagnosis not present

## 2023-08-08 DIAGNOSIS — I10 Essential (primary) hypertension: Secondary | ICD-10-CM | POA: Diagnosis not present

## 2023-08-09 LAB — LIPID PANEL W/O CHOL/HDL RATIO
Cholesterol, Total: 76 mg/dL — ABNORMAL LOW (ref 100–199)
HDL: 30 mg/dL — ABNORMAL LOW (ref >39)
LDL Chol Calc (NIH): 27 mg/dL (ref 0–99)
Triglycerides: 95 mg/dL (ref 0–149)
VLDL Cholesterol Cal: 19 mg/dL (ref 5–40)

## 2023-08-09 LAB — CMP14+EGFR
ALT: 21 IU/L (ref 0–32)
AST: 23 IU/L (ref 0–40)
Albumin: 4.2 g/dL (ref 3.7–4.7)
Alkaline Phosphatase: 79 IU/L (ref 44–121)
BUN/Creatinine Ratio: 19 (ref 12–28)
BUN: 16 mg/dL (ref 8–27)
Bilirubin Total: 0.5 mg/dL (ref 0.0–1.2)
CO2: 25 mmol/L (ref 20–29)
Calcium: 10.7 mg/dL — ABNORMAL HIGH (ref 8.7–10.3)
Chloride: 100 mmol/L (ref 96–106)
Creatinine, Ser: 0.85 mg/dL (ref 0.57–1.00)
Globulin, Total: 3 g/dL (ref 1.5–4.5)
Glucose: 96 mg/dL (ref 70–99)
Potassium: 3.8 mmol/L (ref 3.5–5.2)
Sodium: 139 mmol/L (ref 134–144)
Total Protein: 7.2 g/dL (ref 6.0–8.5)
eGFR: 69 mL/min/1.73 (ref >59)

## 2023-08-09 LAB — CBC WITH DIFFERENTIAL/PLATELET
Basophils Absolute: 0 x10E3/uL (ref 0.0–0.2)
Basos: 1 %
EOS (ABSOLUTE): 0.2 x10E3/uL (ref 0.0–0.4)
Eos: 5 %
Hematocrit: 35.9 % (ref 34.0–46.6)
Hemoglobin: 11.4 g/dL (ref 11.1–15.9)
Immature Grans (Abs): 0 x10E3/uL (ref 0.0–0.1)
Immature Granulocytes: 0 %
Lymphocytes Absolute: 0.5 x10E3/uL — ABNORMAL LOW (ref 0.7–3.1)
Lymphs: 12 %
MCH: 32.1 pg (ref 26.6–33.0)
MCHC: 31.8 g/dL (ref 31.5–35.7)
MCV: 101 fL — ABNORMAL HIGH (ref 79–97)
Monocytes Absolute: 0.4 x10E3/uL (ref 0.1–0.9)
Monocytes: 9 %
Neutrophils Absolute: 3.2 x10E3/uL (ref 1.4–7.0)
Neutrophils: 73 %
Platelets: 212 x10E3/uL (ref 150–450)
RBC: 3.55 x10E6/uL — ABNORMAL LOW (ref 3.77–5.28)
RDW: 15.5 % — ABNORMAL HIGH (ref 11.7–15.4)
WBC: 4.4 x10E3/uL (ref 3.4–10.8)

## 2023-08-09 LAB — TSH+T4F+T3FREE
Free T4: 1.3 ng/dL (ref 0.82–1.77)
T3, Free: 3 pg/mL (ref 2.0–4.4)
TSH: 1.77 u[IU]/mL (ref 0.450–4.500)

## 2023-08-10 ENCOUNTER — Ambulatory Visit: Payer: Self-pay | Admitting: Internal Medicine

## 2023-08-10 NOTE — Progress Notes (Signed)
 Patient notified

## 2023-08-16 ENCOUNTER — Ambulatory Visit: Admitting: Radiation Oncology

## 2023-08-16 DIAGNOSIS — M069 Rheumatoid arthritis, unspecified: Secondary | ICD-10-CM | POA: Diagnosis not present

## 2023-09-06 ENCOUNTER — Inpatient Hospital Stay: Payer: Medicare Other

## 2023-09-06 NOTE — Progress Notes (Unsigned)
 Gynecologic Oncology Interval Visit   Referring Provider: Dr. Claudell Bugler  Chief Concern: high grade endometrial cancer  Subjective:  Kimberly Burke is a 83 y.o. G3P3 female with h/o STEMI s/p stent placement who is seen in consultation from Dr. Kate for high grade endometrial cancer s/p EUA, TLH-BSO with SLN mapping and dissection with Dr. Caye at [facility] on 06/19/2020. She opted for active surveillance and returns for follow up.   She reported spotting which prompted a CT scan.   05/08/2023 CT C/A/P IMPRESSION: 1. No metastatic disease identified within the chest, abdomen or pelvis. 2. Multiple other nonacute observations, as described above.   Follows with Rheumatology for RA.     Gynecologic Oncology History:  Kimberly Burke is a pleasant G3P3 female with h/o STEMI s/p stent placement who is seen in consultation from Dr. Kate for high grade endometrial cancer s/p EUA, TLH-BSO with SLN mapping and dissection for stage Ia disease.  She presented initially with postmenopausal bleeding and was seen by Dr. Kate for evaluation. As part of her evaluation with her primary care office a CT was obtained of the abdomen and pelvis. CT 05/22/2020 showed several findings:  There was a simple appearing cyst on the left lobe of the liver with simple with a maximum dimension of 4.3 cm.  This was not substantially changed from her prior exam.  There were multiple simple appearing right renal cyst primary in the mid and lower lobe the largest arising from the lower lobe measuring 3.8 cm.  2 other cyst in the right kidney were 2.5 to 3 cm in size.  There is no evidence of adrenal or renal mass or renal obstruction on the study obtained without IV contrast. Prominent appearance of the right ovary which contains a focus of calcification.  The right ovary measures approximately 4 cm. The endometrial stripe appears abnormally thickened for a postmenopausal patient measuring 1.7 cm. There is  no fluid or free fluid noted in the pelvis.       05/27/2020 EMBx A. 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  Uterus sounded to 8 cm  06/19/20 Underwent total laparoscopic hysterectomy, bilateral salpingoophorectomy, bilateral sentinel lymph node biopsies, pelvic washings. 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  Mismatch repair: INTACT A. Immunohistochemistry: Normal result. Expression of MLH1, MSH2, MSH6, and PMS2 is retained. B. PCR: Microsatellite stable (MSS).     % of Cells Staining Intensity Score Interpretation  ER IHC 31 1+ POSITIVE  PR IHC <1 2+ NEGATIVE  HER2/neu IHC 40 1+ NEGATIVE   P53 Positive (mutant pattern)   Cytology Scant cytologically bland cells present, suggestive of endometrial cells.  Comment:  There is no evidence of malignancy.  A single group of bland cells resembling endometrial epithelium is present.  The differential diagnosis includes endometriosis versus refluxed benign endometrium.  Clinical correlation with the corresponding surgical pathology specimen ([id number]) is recommended.    Recommendations Tumor Board at [facility] active surveillance; [facility] Tumor Board recommend VBT. Can discuss systemic chemotherapy.  NCCN guidelines reviewed. For stage 1A noninvasive options include VBT or observation if negative washings. Her washings did not definitively demonstrate malignancy. We reviewed recommendations from [facility] and [facility] Tumor Board as well as NCCN guidelines. We  reviewed pros and risks of additional therapy. Risk of recurrence ranges from 10-22%. With radiation the recurrence rate would be reduced to ~5-7%. She would like to proceed with active  surveillance at this time. She will contact me if she changes her mind and if so we will refer to Dr. Lenn.   05/10/2023 She presented with vaginal spotting which prompted a CT scan and exam.   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   Rotator cuff tear arthropathy of right shoulder 02/24/2022   Encounter for follow-up surveillance of endometrial cancer 01/12/2022   Coronary artery disease involving native coronary artery of native heart 06/15/2020   Medicare annual wellness visit, subsequent 06/15/2020   Chronic kidney disease 06/03/2020   Venous stasis 06/03/2020   Endometrial cancer (HCC) 06/03/2020   NSTEMI (non-ST elevated myocardial infarction) (HCC) 12/03/2018   Hypothyroid 12/03/2018   Essential hypertension, benign 12/03/2018   Rheumatoid arthritis (HCC) 12/03/2018   Degenerative arthritis 08/14/2013    Past Medical History: Past Medical History:  Diagnosis Date   Arthritis    Endometrial cancer (HCC)    Hypertension    Hypothyroid    NSTEMI (non-ST elevated myocardial infarction) (HCC)    Rheumatoid arthritis (HCC)    Rotator cuff tear arthropathy of right shoulder 02/24/2022   Thyroid  disease     Past Surgical History: Past Surgical History:  Procedure Laterality Date   ABDOMINAL HYSTERECTOMY  06/19/2020   EUA, TLH-BSO with SLN mapping and dissection with Dr. Caye at [facility]    CORONARY STENT INTERVENTION N/A 12/04/2018   Procedure: CORONARY STENT INTERVENTION;  Surgeon: Florencio Cara BIRCH, MD;  Location: [facility] INVASIVE CV LAB;  Service: Cardiovascular;  Laterality: N/A;   LEFT HEART CATH AND CORONARY ANGIOGRAPHY N/A 12/04/2018   Procedure: LEFT HEART CATH AND CORONARY ANGIOGRAPHY;  Surgeon: Fernand Denyse LABOR, MD;  Location: [facility] INVASIVE CV LAB;   Service: Cardiovascular;  Laterality: N/A;    Past Gynecologic History:  Menarche: 13 Menopause; 41 Last Pap unknown; no abnormal Pap   OB History: SVD x 3 OB History  Gravida Para Term Preterm AB Living  3 3      SAB IAB Ectopic Multiple Live Births          # Outcome Date GA Lbr Len/2nd Weight Sex Type Anes PTL Lv  3 Para           2 Para           1 Para             Family History: Family History  Problem Relation Age of Onset   Cancer Brother        prostate or colon unsure which    Breast cancer Neg Hx     Social History: Social History   Socioeconomic History   Marital status: Widowed    Spouse name: Not on file   Number of children: Not on file   Years of education: Not on file   Highest education level: Not on file  Occupational History   Not on file  Tobacco Use   Smoking status: Former   Smokeless tobacco: Never  Vaping Use   Vaping status: Never Used  Substance and Sexual Activity   Alcohol use: Never   Drug use: Never   Sexual activity: Not Currently  Other Topics Concern   Not on file  Social History Narrative   Lives with daughter   Social Drivers of Health   Financial Resource Strain: Low Risk  (09/02/2021)   Overall Financial Resource Strain (CARDIA)    Difficulty of Paying Living Expenses: Not hard at all  Food Insecurity: No Food Insecurity (09/09/2021)   Hunger Vital Sign    Worried About Running Out of Food in the Last Year: Never true    Ran Out of Food in the Last Year: Never true  Transportation Needs: No Transportation Needs (09/09/2021)   PRAPARE - Administrator, Civil Service (Medical): No    Lack of Transportation (Non-Medical): No  Physical Activity: Insufficiently Active (09/02/2021)   Exercise Vital Sign    Days of Exercise per Week: 5 days    Minutes of Exercise per Session: 10 min  Stress: No Stress Concern Present (09/02/2021)   [employer] of Occupational Health - Occupational Stress Questionnaire     Feeling of Stress : Not at all  Social Connections: Moderately Integrated (09/02/2021)   Social Connection and Isolation Panel    Frequency of Communication with Friends and Family: More than three times a week    Frequency of Social Gatherings with Friends and Family: More than three times a week    Attends Religious Services: More than 4 times per year    Active Member of [employer] or Organizations: Yes    Attends Banker Meetings: More than 4 times per year    Marital Status: Widowed  Intimate Partner Violence: Not on file    Allergies: Allergies  Allergen Reactions   Codeine Itching   Penicillins Other (See Comments)    unknown    Current Medications: Current Outpatient Medications  Medication Sig Dispense Refill   acetaminophen  (TYLENOL ) 650 MG CR tablet Take 1,300 mg by mouth daily.     aspirin  81 MG chewable tablet Chew 1 tablet (81 mg total) by mouth daily.     baclofen  (LIORESAL ) 10 MG tablet Take 1 tablet (10 mg total) by mouth daily. (Patient not taking: Reported on 07/17/2023) 30 tablet 1   BRILINTA  60 MG TABS tablet TAKE 1 TABLET(60 MG) BY MOUTH TWICE DAILY 60 tablet 3   celecoxib  (CELEBREX ) 200 MG capsule Take 1 capsule (200 mg total) by mouth daily. 30 capsule 3   certolizumab pegol  (CIMZIA ) 2 X 200 MG/ML PSKT prefilled syringe Inject into the skin.     Cholecalciferol 1.25 MG (50000 UT) capsule Take by mouth.     DULoxetine  (CYMBALTA ) 30 MG capsule TAKE 1 CAPSULE BY MOUTH EVERY DAY FOR MOOD 90 capsule 3   fluticasone  (FLONASE ) 50 MCG/ACT nasal spray Place 1 spray into both nostrils daily. 11.1 mL 6   folic acid  (FOLVITE ) 1 MG tablet Take 1 tablet (1 mg total) by mouth daily. 90 tablet 3   gabapentin (NEURONTIN) 100 MG capsule Take 100 mg by mouth 3 (three) times daily.     hydrochlorothiazide  (HYDRODIURIL ) 25 MG tablet TAKE 1 TABLET(25 MG) BY MOUTH DAILY 90 tablet 3   hydroxychloroquine (PLAQUENIL) 200 MG tablet Take by mouth.     levothyroxine   (SYNTHROID ) 50 MCG tablet Take 1 tablet (50 mcg total) by mouth daily. 90 tablet 3   losartan  (COZAAR ) 25 MG tablet Take 1 tablet (25 mg total) by mouth daily. 90 tablet 3   methotrexate (RHEUMATREX) 2.5 MG tablet Take 15 mg by mouth once a week.     metoprolol  succinate (  TOPROL -XL) 100 MG 24 hr tablet TAKE 1 TABLET BY MOUTH EVERY DAY 90 tablet 0   metoprolol  succinate (TOPROL -XL) 50 MG 24 hr tablet TAKE 1 TABLET BY MOUTH EVERY DAY ALONG WITH 100 MG AS TOTAL 150 MG AS DIRECTED 90 tablet 3   ondansetron  (ZOFRAN -ODT) 4 MG disintegrating tablet Take 1 tablet (4 mg total) by mouth every 8 (eight) hours as needed for nausea or vomiting. (Patient not taking: Reported on 07/17/2023) 20 tablet 0   pantoprazole  (PROTONIX ) 40 MG tablet Take 1 tablet (40 mg total) by mouth daily. (Patient not taking: Reported on 07/17/2023) 90 tablet 3   rosuvastatin  (CRESTOR ) 40 MG tablet TAKE 1 TABLET BY MOUTH EVERY DAY 90 tablet 3   No current facility-administered medications for this visit.   Review of Systems General:  no complaints Skin: no complaints Eyes: no complaints HEENT: no complaints Breasts: no complaints Pulmonary: intermittent shortness of breath Cardiac: no complaints Gastrointestinal: no nausea or vomiting, diarrhea or constipation Genitourinary/Sexual: no complaints Ob/Gyn: vaginal spotting Musculoskeletal: back pain Hematology: no complaints Neurologic/Psych: numbness toes   Objective:  Physical Examination:  There were no vitals taken for this visit.   GENERAL: Patient is a well appearing female in no acute distress ABDOMEN:  Soft, nontender, nondistended. No masses or ascites.  EXTREMITIES:  No peripheral edema.   NEURO:  Nonfocal. Well oriented.  Appropriate affect.  Pelvic: EGBUS: no lesions Vagina: narrow vaginal vault and visualization difficult. Flesh colored lesion at the vaginal apex. A larger speculum was used for visualization but still limited exam. On that exam able to  visualize another flesh colored area on the posterior vagina approximately mid way up from the introitus.  Cervix: absent Uterus: absent BME: no palpable pelvic masses; the vaginal cuff is smooth except for left fornix where there is a palpable 8 mm soft nodular area.  Rectovaginal: confirmatory   Procedure Note:  Informed Consent obtained. Time out performed. Upon visualization a portion of tissue was removed from the posterior vagina and vaginal cuff. Hemostasis excellent with AgNO3. Patient reassessed after procedure and in stable condition. No complications.  Lab Review N/A  Radiologic Imaging: As per HPI  Assessment:  Kimberly Burke is a 83 y.o. female diagnosed with high grade endometrial cancer (pMMR, MSS, ER 1+; PR negative; Her2neu negative, p53 positive expression-mutant pattern), stage IA noninvasive endometrial cancer with washings that did not definitively demonstrate malignancy 06/19/20. Discussed VBT vs surveillance post op and patient elected for surveillance. Clinically, asymptomatic. NED on exam. Interval CT scan 11/22 and 4/25 were negative for radiographic evidence of disease.   Exam concerning for recurrence, biopsies obtained  Possible abdominal hernia versus laxity- imaging in November 2022 revealed small fat containing right lateral abdominal wall hernia, asymptomatic  Back pain, suspect due to arthritis   Hepatic and renal cysts  Bone density scan 7/23 shows osteopenia.    Follows with Rheumatology for RA.   Medical co-morbidities complicating care: HTN, h/o STEMI 2020 s/p stent placement (Dr. Fernand is her cardiologist), RA (Dr. Rona Glenn), hypothyroidism, There is no height or weight on file to calculate BMI.  Plan:   Problem List Items Addressed This Visit       Genitourinary   Endometrial cancer (HCC) - Primary    Biopsies sent to pathology [facility]. If positive recommend radiation oncology and medical oncology consults. Most likely will recommend  WPRT with Vaginal cuff brachtherapy. Will need to incorporate the involved vaginal disease in the radiation field. Chemotherapy with paclitaxel and  carboplatin. Immunotherapy option limited in the setting of RA.   The patient's diagnosis, an outline of the further diagnostic and laboratory studies which will be required, the recommendation, and alternatives were discussed.  All questions were answered to the patient's satisfaction.  I personally had a face to face interaction and evaluated the patient jointly with the NP, Ms. Tinnie Dawn.  I have reviewed her history and available records and have performed the key portions of the physical exam including lymph node survey, abdominal exam, pelvic exam with my findings confirming those documented above by the APP.  I have discussed the case with the APP and the patient.  I agree with the above documentation, assessment and plan which was fully formulated by me.  Counseling was completed by me.    Prentice Agent, MD

## 2023-09-06 NOTE — Progress Notes (Deleted)
 Gynecologic Oncology Interval Visit   Referring Provider: Dr. Claudell Bugler  Chief Concern: high grade endometrial cancer  Subjective:  Kimberly Burke is a 83 y.o. G3P3 female with h/o STEMI s/p stent placement who is seen in consultation from Dr. Kate for high grade endometrial cancer s/p EUA, TLH-BSO with SLN mapping and dissection with Dr. Caye at [facility] on 06/19/2020. She opted for active surveillance and returns for follow up.   She reported spotting which prompted a CT scan.   05/08/2023 CT C/A/P IMPRESSION: 1. No metastatic disease identified within the chest, abdomen or pelvis. 2. Multiple other nonacute observations, as described above.   Follows with Rheumatology for RA.     Gynecologic Oncology History:  TJUANA Burke is a pleasant G3P3 female with h/o STEMI s/p stent placement who is seen in consultation from Dr. Kate for high grade endometrial cancer s/p EUA, TLH-BSO with SLN mapping and dissection for stage Ia disease.  She presented initially with postmenopausal bleeding and was seen by Dr. Kate for evaluation. As part of her evaluation with her primary care office a CT was obtained of the abdomen and pelvis. CT 05/22/2020 showed several findings:  There was a simple appearing cyst on the left lobe of the liver with simple with a maximum dimension of 4.3 cm.  This was not substantially changed from her prior exam.  There were multiple simple appearing right renal cyst primary in the mid and lower lobe the largest arising from the lower lobe measuring 3.8 cm.  2 other cyst in the right kidney were 2.5 to 3 cm in size.  There is no evidence of adrenal or renal mass or renal obstruction on the study obtained without IV contrast. Prominent appearance of the right ovary which contains a focus of calcification.  The right ovary measures approximately 4 cm. The endometrial stripe appears abnormally thickened for a postmenopausal patient measuring 1.7 cm. There is  no fluid or free fluid noted in the pelvis.       05/27/2020 EMBx A. 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  Uterus sounded to 8 cm  06/19/20 Underwent total laparoscopic hysterectomy, bilateral salpingoophorectomy, bilateral sentinel lymph node biopsies, pelvic washings. 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  Mismatch repair: INTACT A. Immunohistochemistry: Normal result. Expression of MLH1, MSH2, MSH6, and PMS2 is retained. B. PCR: Microsatellite stable (MSS).     % of Cells Staining Intensity Score Interpretation  ER IHC 31 1+ POSITIVE  PR IHC <1 2+ NEGATIVE  HER2/neu IHC 40 1+ NEGATIVE   P53 Positive (mutant pattern)   Cytology Scant cytologically bland cells present, suggestive of endometrial cells.  Comment:  There is no evidence of malignancy.  A single group of bland cells resembling endometrial epithelium is present.  The differential diagnosis includes endometriosis versus refluxed benign endometrium.  Clinical correlation with the corresponding surgical pathology specimen ([id number]) is recommended.    Recommendations Tumor Board at [facility] active surveillance; [facility] Tumor Board recommend VBT. Can discuss systemic chemotherapy.  NCCN guidelines reviewed. For stage 1A noninvasive options include VBT or observation if negative washings. Her washings did not definitively demonstrate malignancy. We reviewed recommendations from [facility] and [facility] Tumor Board as well as NCCN guidelines. We  reviewed pros and risks of additional therapy. Risk of recurrence ranges from 10-22%. With radiation the recurrence rate would be reduced to ~5-7%. She would like to proceed with active  surveillance at this time. She will contact me if she changes her mind and if so we will refer to Dr. Lenn.   05/10/2023 She presented with vaginal spotting which prompted a CT scan and exam.   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   Rotator cuff tear arthropathy of right shoulder 02/24/2022   Encounter for follow-up surveillance of endometrial cancer 01/12/2022   Coronary artery disease involving native coronary artery of native heart 06/15/2020   Medicare annual wellness visit, subsequent 06/15/2020   Chronic kidney disease 06/03/2020   Venous stasis 06/03/2020   Endometrial cancer (HCC) 06/03/2020   NSTEMI (non-ST elevated myocardial infarction) (HCC) 12/03/2018   Hypothyroid 12/03/2018   Essential hypertension, benign 12/03/2018   Rheumatoid arthritis (HCC) 12/03/2018   Degenerative arthritis 08/14/2013    Past Medical History: Past Medical History:  Diagnosis Date   Arthritis    Endometrial cancer (HCC)    Hypertension    Hypothyroid    NSTEMI (non-ST elevated myocardial infarction) (HCC)    Rheumatoid arthritis (HCC)    Rotator cuff tear arthropathy of right shoulder 02/24/2022   Thyroid  disease     Past Surgical History: Past Surgical History:  Procedure Laterality Date   ABDOMINAL HYSTERECTOMY  06/19/2020   EUA, TLH-BSO with SLN mapping and dissection with Dr. Caye at [facility]    CORONARY STENT INTERVENTION N/A 12/04/2018   Procedure: CORONARY STENT INTERVENTION;  Surgeon: Florencio Cara BIRCH, MD;  Location: [facility] INVASIVE CV LAB;  Service: Cardiovascular;  Laterality: N/A;   LEFT HEART CATH AND CORONARY ANGIOGRAPHY N/A 12/04/2018   Procedure: LEFT HEART CATH AND CORONARY ANGIOGRAPHY;  Surgeon: Fernand Denyse LABOR, MD;  Location: [facility] INVASIVE CV LAB;   Service: Cardiovascular;  Laterality: N/A;    Past Gynecologic History:  Menarche: 13 Menopause; 41 Last Pap unknown; no abnormal Pap   OB History: SVD x 3 OB History  Gravida Para Term Preterm AB Living  3 3      SAB IAB Ectopic Multiple Live Births          # Outcome Date GA Lbr Len/2nd Weight Sex Type Anes PTL Lv  3 Para           2 Para           1 Para             Family History: Family History  Problem Relation Age of Onset   Cancer Brother        prostate or colon unsure which    Breast cancer Neg Hx     Social History: Social History   Socioeconomic History   Marital status: Widowed    Spouse name: Not on file   Number of children: Not on file   Years of education: Not on file   Highest education level: Not on file  Occupational History   Not on file  Tobacco Use   Smoking status: Former   Smokeless tobacco: Never  Vaping Use   Vaping status: Never Used  Substance and Sexual Activity   Alcohol use: Never   Drug use: Never   Sexual activity: Not Currently  Other Topics Concern   Not on file  Social History Narrative   Lives with daughter   Social Drivers of Health   Financial Resource Strain: Low Risk  (09/02/2021)   Overall Financial Resource Strain (CARDIA)    Difficulty of Paying Living Expenses: Not hard at all  Food Insecurity: No Food Insecurity (09/09/2021)   Hunger Vital Sign    Worried About Running Out of Food in the Last Year: Never true    Ran Out of Food in the Last Year: Never true  Transportation Needs: No Transportation Needs (09/09/2021)   PRAPARE - Administrator, Civil Service (Medical): No    Lack of Transportation (Non-Medical): No  Physical Activity: Insufficiently Active (09/02/2021)   Exercise Vital Sign    Days of Exercise per Week: 5 days    Minutes of Exercise per Session: 10 min  Stress: No Stress Concern Present (09/02/2021)   [employer] of Occupational Health - Occupational Stress Questionnaire     Feeling of Stress : Not at all  Social Connections: Moderately Integrated (09/02/2021)   Social Connection and Isolation Panel    Frequency of Communication with Friends and Family: More than three times a week    Frequency of Social Gatherings with Friends and Family: More than three times a week    Attends Religious Services: More than 4 times per year    Active Member of [employer] or Organizations: Yes    Attends Banker Meetings: More than 4 times per year    Marital Status: Widowed  Intimate Partner Violence: Not on file    Allergies: Allergies  Allergen Reactions   Codeine Itching   Penicillins Other (See Comments)    unknown    Current Medications: Current Outpatient Medications  Medication Sig Dispense Refill   acetaminophen  (TYLENOL ) 650 MG CR tablet Take 1,300 mg by mouth daily.     aspirin  81 MG chewable tablet Chew 1 tablet (81 mg total) by mouth daily.     baclofen  (LIORESAL ) 10 MG tablet Take 1 tablet (10 mg total) by mouth daily. (Patient not taking: Reported on 07/17/2023) 30 tablet 1   BRILINTA  60 MG TABS tablet TAKE 1 TABLET(60 MG) BY MOUTH TWICE DAILY 60 tablet 3   celecoxib  (CELEBREX ) 200 MG capsule Take 1 capsule (200 mg total) by mouth daily. 30 capsule 3   certolizumab pegol  (CIMZIA ) 2 X 200 MG/ML PSKT prefilled syringe Inject into the skin.     Cholecalciferol 1.25 MG (50000 UT) capsule Take by mouth.     DULoxetine  (CYMBALTA ) 30 MG capsule TAKE 1 CAPSULE BY MOUTH EVERY DAY FOR MOOD 90 capsule 3   fluticasone  (FLONASE ) 50 MCG/ACT nasal spray Place 1 spray into both nostrils daily. 11.1 mL 6   folic acid  (FOLVITE ) 1 MG tablet Take 1 tablet (1 mg total) by mouth daily. 90 tablet 3   gabapentin (NEURONTIN) 100 MG capsule Take 100 mg by mouth 3 (three) times daily.     hydrochlorothiazide  (HYDRODIURIL ) 25 MG tablet TAKE 1 TABLET(25 MG) BY MOUTH DAILY 90 tablet 3   hydroxychloroquine (PLAQUENIL) 200 MG tablet Take by mouth.     levothyroxine   (SYNTHROID ) 50 MCG tablet Take 1 tablet (50 mcg total) by mouth daily. 90 tablet 3   losartan  (COZAAR ) 25 MG tablet Take 1 tablet (25 mg total) by mouth daily. 90 tablet 3   methotrexate (RHEUMATREX) 2.5 MG tablet Take 15 mg by mouth once a week.     metoprolol  succinate (  TOPROL -XL) 100 MG 24 hr tablet TAKE 1 TABLET BY MOUTH EVERY DAY 90 tablet 0   metoprolol  succinate (TOPROL -XL) 50 MG 24 hr tablet TAKE 1 TABLET BY MOUTH EVERY DAY ALONG WITH 100 MG AS TOTAL 150 MG AS DIRECTED 90 tablet 3   ondansetron  (ZOFRAN -ODT) 4 MG disintegrating tablet Take 1 tablet (4 mg total) by mouth every 8 (eight) hours as needed for nausea or vomiting. (Patient not taking: Reported on 07/17/2023) 20 tablet 0   pantoprazole  (PROTONIX ) 40 MG tablet Take 1 tablet (40 mg total) by mouth daily. (Patient not taking: Reported on 07/17/2023) 90 tablet 3   rosuvastatin  (CRESTOR ) 40 MG tablet TAKE 1 TABLET BY MOUTH EVERY DAY 90 tablet 3   No current facility-administered medications for this visit.   Review of Systems General:  no complaints Skin: no complaints Eyes: no complaints HEENT: no complaints Breasts: no complaints Pulmonary: intermittent shortness of breath Cardiac: no complaints Gastrointestinal: no nausea or vomiting, diarrhea or constipation Genitourinary/Sexual: no complaints Ob/Gyn: vaginal spotting Musculoskeletal: back pain Hematology: no complaints Neurologic/Psych: numbness toes   Objective:  Physical Examination:  There were no vitals taken for this visit.   GENERAL: Patient is a well appearing female in no acute distress ABDOMEN:  Soft, nontender, nondistended. No masses or ascites.  EXTREMITIES:  No peripheral edema.   NEURO:  Nonfocal. Well oriented.  Appropriate affect.  Pelvic: EGBUS: no lesions Vagina: narrow vaginal vault and visualization difficult. Flesh colored lesion at the vaginal apex. A larger speculum was used for visualization but still limited exam. On that exam able to  visualize another flesh colored area on the posterior vagina approximately mid way up from the introitus.  Cervix: absent Uterus: absent BME: no palpable pelvic masses; the vaginal cuff is smooth except for left fornix where there is a palpable 8 mm soft nodular area.  Rectovaginal: confirmatory   Procedure Note:  Informed Consent obtained. Time out performed. Upon visualization a portion of tissue was removed from the posterior vagina and vaginal cuff. Hemostasis excellent with AgNO3. Patient reassessed after procedure and in stable condition. No complications.  Lab Review N/A  Radiologic Imaging: As per HPI  Assessment:  Kimberly Burke is a 83 y.o. female diagnosed with high grade endometrial cancer (pMMR, MSS, ER 1+; PR negative; Her2neu negative, p53 positive expression-mutant pattern), stage IA noninvasive endometrial cancer with washings that did not definitively demonstrate malignancy 06/19/20. Discussed VBT vs surveillance post op and patient elected for surveillance. Clinically, asymptomatic. NED on exam. Interval CT scan 11/22 and 4/25 were negative for radiographic evidence of disease.   Exam concerning for recurrence, biopsies obtained  Possible abdominal hernia versus laxity- imaging in November 2022 revealed small fat containing right lateral abdominal wall hernia, asymptomatic  Back pain, suspect due to arthritis   Hepatic and renal cysts  Bone density scan 7/23 shows osteopenia.    Follows with Rheumatology for RA.   Medical co-morbidities complicating care: HTN, h/o STEMI 2020 s/p stent placement (Dr. Fernand is her cardiologist), RA (Dr. Rona Glenn), hypothyroidism, There is no height or weight on file to calculate BMI.  Plan:   Problem List Items Addressed This Visit       Genitourinary   Endometrial cancer (HCC) - Primary    Biopsies sent to pathology [facility]. If positive recommend radiation oncology and medical oncology consults. Most likely will recommend  WPRT with Vaginal cuff brachtherapy. Will need to incorporate the involved vaginal disease in the radiation field. Chemotherapy with paclitaxel and  carboplatin. Immunotherapy option limited in the setting of RA.   The patient's diagnosis, an outline of the further diagnostic and laboratory studies which will be required, the recommendation, and alternatives were discussed.  All questions were answered to the patient's satisfaction.  I personally had a face to face interaction and evaluated the patient jointly with the NP, Ms. Tinnie Dawn.  I have reviewed her history and available records and have performed the key portions of the physical exam including lymph node survey, abdominal exam, pelvic exam with my findings confirming those documented above by the APP.  I have discussed the case with the APP and the patient.  I agree with the above documentation, assessment and plan which was fully formulated by me.  Counseling was completed by me.    Prentice Agent, MD

## 2023-09-07 ENCOUNTER — Other Ambulatory Visit: Payer: Self-pay | Admitting: Cardiovascular Disease

## 2023-09-07 DIAGNOSIS — I2510 Atherosclerotic heart disease of native coronary artery without angina pectoris: Secondary | ICD-10-CM

## 2023-09-13 DIAGNOSIS — M069 Rheumatoid arthritis, unspecified: Secondary | ICD-10-CM | POA: Diagnosis not present

## 2023-09-14 ENCOUNTER — Ambulatory Visit
Admission: RE | Admit: 2023-09-14 | Discharge: 2023-09-14 | Discharge status: Home / Self Care | Source: Ambulatory Visit | Attending: Radiation Oncology | Admitting: Radiation Oncology

## 2023-09-14 DIAGNOSIS — C541 Malignant neoplasm of endometrium: Secondary | ICD-10-CM | POA: Insufficient documentation

## 2023-09-14 NOTE — Progress Notes (Signed)
 Radiation Oncology Follow up Note  Name: Kimberly Burke   Date:   09/14/2023 MRN:  [id number] DOB: 1940-04-03    This 83 y.o. female presents to the clinic today for 1 month follow-up.  Status post IMRT radiation therapy plus vaginal brachytherapy for recurrent high-grade endometrial carcinoma status post TLH BSO in 2022 and now with vaginal recurrence  REFERRING PROVIDER: Fernand Fredy RAMAN, MD  HPI: Patient is an 83 year old female now out 1 month having completed both external beam radiation therapy as well as vaginal brachytherapy for vaginal recurrence status post T8 TLH BSO in 2022 for high-grade Demetrio carcinoma.  She is seen today in routine follow-up she is doing well specifically denies any increased urinary tract symptoms diarrhea or fatigue.  She is having occasionally what she describes as slight vaginal discharge no blood.  COMPLICATIONS OF TREATMENT: none  FOLLOW UP COMPLIANCE: keeps appointments   PHYSICAL EXAM:  There were no vitals taken for this visit. Well-developed well-nourished patient in NAD. HEENT reveals PERLA, EOMI, discs not visualized.  Oral cavity is clear. No oral mucosal lesions are identified. Neck is clear without evidence of cervical or supraclavicular adenopathy. Lungs are clear to A&P. Cardiac examination is essentially unremarkable with regular rate and rhythm without murmur rub or thrill. Abdomen is benign with no organomegaly or masses noted. Motor sensory and DTR levels are equal and symmetric in the upper and lower extremities. Cranial nerves II through XII are grossly intact. Proprioception is intact. No peripheral adenopathy or edema is identified. No motor or sensory levels are noted. Crude visual fields are within normal range.  RADIOLOGY RESULTS: No current films for review  PLAN: Present time patient is doing well with very low side effect profile.  She will see GYN oncology next week and have a pelvic exam.  I have asked to see her back in 4 to 5  months for follow-up.  Patient knows to call with any concerns.  I have asked her to forward me any imaging study she has for my review.  I would like to take this opportunity to thank you for allowing me to participate in the care of your patient.SABRA Marcey Penton, MD

## 2023-09-27 ENCOUNTER — Inpatient Hospital Stay: Attending: Obstetrics and Gynecology | Admitting: Obstetrics and Gynecology

## 2023-09-27 VITALS — BP 120/66 | HR 63 | Temp 97.8°F | Resp 19 | Wt 190.4 lb

## 2023-09-27 DIAGNOSIS — C541 Malignant neoplasm of endometrium: Secondary | ICD-10-CM

## 2023-09-27 DIAGNOSIS — Z8542 Personal history of malignant neoplasm of other parts of uterus: Secondary | ICD-10-CM | POA: Insufficient documentation

## 2023-09-27 DIAGNOSIS — Z9221 Personal history of antineoplastic chemotherapy: Secondary | ICD-10-CM | POA: Insufficient documentation

## 2023-09-27 DIAGNOSIS — Z90710 Acquired absence of both cervix and uterus: Secondary | ICD-10-CM | POA: Insufficient documentation

## 2023-09-27 DIAGNOSIS — Z923 Personal history of irradiation: Secondary | ICD-10-CM | POA: Insufficient documentation

## 2023-09-27 DIAGNOSIS — Z08 Encounter for follow-up examination after completed treatment for malignant neoplasm: Secondary | ICD-10-CM | POA: Diagnosis not present

## 2023-09-27 DIAGNOSIS — Z90722 Acquired absence of ovaries, bilateral: Secondary | ICD-10-CM | POA: Insufficient documentation

## 2023-09-27 DIAGNOSIS — Z9079 Acquired absence of other genital organ(s): Secondary | ICD-10-CM | POA: Diagnosis not present

## 2023-09-27 NOTE — Progress Notes (Signed)
 Gynecologic Oncology Interval Visit   Referring Provider: Dr. 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 G3P3 female with h/o STEMI s/p stent placement who is seen in consultation from Dr. Kate for high grade endometrial cancer s/p EUA, TLH-BSO with SLN mapping and dissection with Dr. Caye at [facility] on 06/19/2020. She opted for active surveillance and developed a vaginal recurrence in 4/25 that was treated with radiation, returns for follow up.   Underwent IMRT radiation therapy plus vaginal brachytherapy with Dr Chrystal 5-7/25 for vaginal recurrence.  Imaging negative for metastatic disease. No complaints today.   Follows with Rheumatology for RA.   Gynecologic Oncology History:  Kimberly Burke is a pleasant G3P3 female with h/o STEMI s/p stent placement who is seen in consultation from Dr. Kate for high grade endometrial cancer s/p EUA, TLH-BSO with SLN mapping and dissection for stage Ia disease.  She presented initially with postmenopausal bleeding and was seen by Dr. Kate for evaluation. As part of her evaluation with her primary care office a CT was obtained of the abdomen and pelvis. CT 05/22/2020 showed several findings:  There was a simple appearing cyst on the left lobe of the liver with simple with a maximum dimension of 4.3 cm.  This was not substantially changed from her prior exam.  There were multiple simple appearing right renal cyst primary in the mid and lower lobe the largest arising from the lower lobe measuring 3.8 cm.  2 other cyst in the right kidney were 2.5 to 3 cm in size.  There is no evidence of adrenal or renal mass or renal obstruction on the study obtained without IV contrast. Prominent appearance of the right ovary which contains a focus of calcification.  The right ovary measures approximately 4 cm. The endometrial stripe appears abnormally thickened for a  postmenopausal patient measuring 1.7 cm. There is no fluid or free fluid noted in the pelvis.       05/27/2020 EMBx A. 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  Uterus sounded to 8 cm  06/19/20 Underwent total laparoscopic hysterectomy, bilateral salpingoophorectomy, bilateral sentinel lymph node biopsies, pelvic washings. 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  Mismatch repair: INTACT A. Immunohistochemistry: Normal result. Expression of MLH1, MSH2, MSH6, and PMS2 is retained. B. PCR: Microsatellite stable (MSS).     % of Cells Staining Intensity Score Interpretation  ER IHC 31 1+ POSITIVE  PR IHC <1 2+ NEGATIVE  HER2/neu IHC 40 1+ NEGATIVE   P53 Positive (mutant pattern)   Cytology Scant cytologically bland cells present, suggestive of endometrial cells.  Comment:  There is no evidence of malignancy.  A single group of bland cells resembling endometrial epithelium is present.  The differential diagnosis includes endometriosis versus refluxed benign endometrium.  Clinical correlation with the corresponding surgical pathology specimen ([id number]) is recommended.    Recommendations Tumor Board at [facility] active surveillance; [facility] Tumor Board recommend VBT. Can discuss systemic chemotherapy.  NCCN guidelines reviewed. For stage 1A noninvasive options include VBT or observation if negative washings. Her washings did not definitively demonstrate malignancy. We reviewed recommendations from [facility] and [facility] Tumor Board as well as NCCN guidelines.  We reviewed pros and risks of additional therapy. Risk of recurrence ranges from 10-22%. With radiation the recurrence rate would be reduced to  ~5-7%. She would like to proceed with active surveillance at this time. She will contact me if she changes her mind and if so we will refer to Dr. Lenn.   In 4/25 reported spotting which prompted a CT scan.  Vaginal biopsy of 8 mm lesion in left fornix showed serous carcinoma  05/08/2023 CT C/A/P IMPRESSION: 1. No metastatic disease identified within the chest, abdomen or pelvis. 2. Multiple other nonacute observations, as described above.  PET scan 4/25 FINDINGS: Mediastinal blood pool activity: SUV max 3.1   Liver activity: SUV max 3.1   NECK: There is some focal soft tissue thickening in the nasopharynx posteriorly with some mild uptake. Maximum SUV value of this area 5.7. Please correlate with direct visualization to exclude lesion. There is some muscular physiologic uptake paraspinal upper cervical spine as well, right greater than left. No specific abnormal uptake seen above blood pool along lymph node change the neck including submandibular, posterior triangle or internal jugular region. Near symmetric uptake of the visualized intracranial compartment.   Incidental CT findings: Streak artifact related to patient's dental hardware. Visualized paranasal sinuses and mastoid air cells are clear. The parotid glands, submandibular glands and thyroid  glands unremarkable.   CHEST: No specific abnormal radiotracer uptake seen above blood pool in the axillary regions, hilum or mediastinum. No abnormal lung uptake.   Incidental CT findings: Coronary artery calcifications are seen. Borderline size heart. No pericardial effusion. Thoracic aorta is normal course and caliber with some calcified plaque. Normal caliber thoracic esophagus. Breathing motion. There is some linear opacity seen along the lungs likely scar or atelectasis. No pleural effusion, pneumothorax or consolidation.   ABDOMEN/PELVIS: There is physiologic distribution radiotracer along the parenchymal organs, bowel  and renal collecting systems. Exception is some prominent uptake along the anorectal region with maximum SUV value of this area approaching 11.3. Please correlate with clinical findings and additional workup as needed. There is significant stool in the rectum. Appearance is similar to the prior CT scan of 05/08/2023.   Incidental CT findings: As seen previously there is some hepatic cystic foci identified. The gallbladder is nondilated. Right-sided renal cysts. Few left-sided cysts. Punctate nonobstructing left-sided renal stone. Mild renal atrophy. No ureteral stones. Underdistended urinary bladder. Scattered diffuse colonic diverticula. No bowel obstruction. No free air or free fluid. Absence of the uterus and ovaries. No separate adnexal mass. No ascites. Scattered vascular calcifications.   SKELETON: No specific abnormal uptake identified along the visualized osseous structures. Curvature of the spine with diffuse degenerative changes along the spine. 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 Degenerative changes of the pelvis. Sclerosis along the sacroiliac joints with some subchondral cyst formation   IMPRESSION: No areas of abnormal radiotracer uptake to suggest recurrent or metastatic disease.   However there is significant uptake along the anorectal region with some nodular thickening in this location on CT. Please correlate with clinical findings to assess for true lesion. There is significant stool in the rectum.   Slight thickening of the nasopharynx with some mild uptake. Please correlate with clinical findings.   Ancillary findings including colonic diverticula. Hepatic and renal cysts. Punctate nonobstructing left-sided renal stone.  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 06/03/2022   Rotator cuff tear arthropathy of right shoulder 02/24/2022   Encounter for follow-up surveillance of endometrial cancer 01/12/2022   Coronary artery disease involving native coronary artery of native heart 06/15/2020   Medicare annual wellness visit, subsequent 06/15/2020   Chronic kidney disease 06/03/2020   Venous stasis 06/03/2020   Endometrial cancer (HCC) 06/03/2020   NSTEMI (non-ST elevated myocardial infarction) (HCC) 12/03/2018   Hypothyroid 12/03/2018   Essential hypertension, benign 12/03/2018   Rheumatoid arthritis (HCC) 12/03/2018   Degenerative arthritis 08/14/2013    Past Medical History: Past Medical History:  Diagnosis Date   Arthritis    Endometrial cancer (HCC)    Hypertension    Hypothyroid    NSTEMI (non-ST elevated myocardial infarction) (HCC)    Rheumatoid arthritis (HCC)    Rotator cuff tear arthropathy of right shoulder 02/24/2022   Thyroid  disease     Past Surgical History: Past Surgical History:  Procedure Laterality Date   ABDOMINAL HYSTERECTOMY  06/19/2020   EUA, TLH-BSO with SLN mapping and dissection with Dr. Caye at [facility]    CORONARY STENT INTERVENTION N/A 12/04/2018   Procedure: CORONARY STENT INTERVENTION;  Surgeon: Florencio Cara BIRCH, MD;  Location: [facility] INVASIVE CV LAB;  Service: Cardiovascular;  Laterality: N/A;   LEFT HEART CATH AND CORONARY ANGIOGRAPHY N/A 12/04/2018   Procedure: LEFT HEART CATH AND CORONARY ANGIOGRAPHY;  Surgeon: Fernand Denyse LABOR, MD;  Location: [facility] INVASIVE CV LAB;  Service: Cardiovascular;  Laterality: N/A;    Past Gynecologic History:  Menarche: 13 Menopause; 41 Last Pap unknown; no abnormal Pap   OB History: SVD x 3 OB History  Gravida Para Term Preterm AB Living  3 3      SAB IAB Ectopic Multiple Live Births          # Outcome Date GA Lbr Len/2nd Weight Sex Type Anes PTL Lv  3 Para           2 Para           1 Para             Family History: Family History  Problem  Relation Age of Onset   Cancer Brother        prostate or colon unsure which    Breast cancer Neg Hx     Social History: Social History   Socioeconomic History   Marital status: Widowed    Spouse name: Not on file   Number of children: Not on file   Years of education: Not on file   Highest education level: Not on file  Occupational History   Not on file  Tobacco Use   Smoking status: Former   Smokeless tobacco: Never  Vaping Use   Vaping status: Never Used  Substance and Sexual Activity   Alcohol use: Never   Drug use: Never   Sexual activity: Not Currently  Other Topics Concern   Not on file  Social History Narrative   Lives with daughter   Social Drivers of Health   Financial Resource Strain: Low Risk  (09/02/2021)   Overall Financial Resource Strain (CARDIA)    Difficulty of Paying Living Expenses: Not hard at all  Food Insecurity: No Food Insecurity (09/09/2021)   Hunger Vital Sign    Worried About Running Out of Food in the Last Year: Never true    Ran Out of Food in the Last Year: Never true  Transportation Needs: No Transportation Needs (09/09/2021)   PRAPARE - Transportation  Lack of Transportation (Medical): No    Lack of Transportation (Non-Medical): No  Physical Activity: Insufficiently Active (09/02/2021)   Exercise Vital Sign    Days of Exercise per Week: 5 days    Minutes of Exercise per Session: 10 min  Stress: No Stress Concern Present (09/02/2021)   [employer] of Occupational Health - Occupational Stress Questionnaire    Feeling of Stress : Not at all  Social Connections: Moderately Integrated (09/02/2021)   Social Connection and Isolation Panel    Frequency of Communication with Friends and Family: More than three times a week    Frequency of Social Gatherings with Friends and Family: More than three times a week    Attends Religious Services: More than 4 times per year    Active Member of [employer] or Organizations: Yes    Attends Tax inspector Meetings: More than 4 times per year    Marital Status: Widowed  Intimate Partner Violence: Not on file    Allergies: Allergies  Allergen Reactions   Codeine Itching   Penicillins Other (See Comments)    unknown    Current Medications: Current Outpatient Medications  Medication Sig Dispense Refill   acetaminophen  (TYLENOL ) 650 MG CR tablet Take 1,300 mg by mouth daily.     aspirin  81 MG chewable tablet Chew 1 tablet (81 mg total) by mouth daily.     baclofen  (LIORESAL ) 10 MG tablet Take 1 tablet (10 mg total) by mouth daily. (Patient not taking: Reported on 07/17/2023) 30 tablet 1   BRILINTA  60 MG TABS tablet TAKE 1 TABLET(60 MG) BY MOUTH TWICE DAILY 60 tablet 3   celecoxib  (CELEBREX ) 200 MG capsule Take 1 capsule (200 mg total) by mouth daily. 30 capsule 3   certolizumab pegol  (CIMZIA ) 2 X 200 MG/ML PSKT prefilled syringe Inject into the skin.     Cholecalciferol 1.25 MG (50000 UT) capsule Take by mouth.     DULoxetine  (CYMBALTA ) 30 MG capsule TAKE 1 CAPSULE BY MOUTH EVERY DAY FOR MOOD 90 capsule 3   fluticasone  (FLONASE ) 50 MCG/ACT nasal spray Place 1 spray into both nostrils daily. 11.1 mL 6   folic acid  (FOLVITE ) 1 MG tablet Take 1 tablet (1 mg total) by mouth daily. 90 tablet 3   gabapentin (NEURONTIN) 100 MG capsule Take 100 mg by mouth 3 (three) times daily.     hydrochlorothiazide  (HYDRODIURIL ) 25 MG tablet TAKE 1 TABLET(25 MG) BY MOUTH DAILY 90 tablet 3   hydroxychloroquine (PLAQUENIL) 200 MG tablet Take by mouth.     levothyroxine  (SYNTHROID ) 50 MCG tablet Take 1 tablet (50 mcg total) by mouth daily. 90 tablet 3   losartan  (COZAAR ) 25 MG tablet Take 1 tablet (25 mg total) by mouth daily. 90 tablet 3   methotrexate (RHEUMATREX) 2.5 MG tablet Take 15 mg by mouth once a week.     metoprolol  succinate (TOPROL -XL) 100 MG 24 hr tablet TAKE 1 TABLET BY MOUTH EVERY DAY 90 tablet 0   metoprolol  succinate (TOPROL -XL) 50 MG 24 hr tablet TAKE 1 TABLET BY MOUTH EVERY DAY  ALONG WITH 100 MG AS TOTAL 150 MG AS DIRECTED 90 tablet 3   ondansetron  (ZOFRAN -ODT) 4 MG disintegrating tablet Take 1 tablet (4 mg total) by mouth every 8 (eight) hours as needed for nausea or vomiting. (Patient not taking: Reported on 07/17/2023) 20 tablet 0   pantoprazole  (PROTONIX ) 40 MG tablet Take 1 tablet (40 mg total) by mouth daily. (Patient not taking: Reported on 07/17/2023) 90 tablet 3  rosuvastatin  (CRESTOR ) 40 MG tablet TAKE 1 TABLET BY MOUTH EVERY DAY 90 tablet 3   No current facility-administered medications for this visit.   Review of Systems General:  no complaints Skin: no complaints Eyes: no complaints HEENT: no complaints Breasts: no complaints Pulmonary: intermittent shortness of breath Cardiac: no complaints Gastrointestinal: no nausea or vomiting, diarrhea or constipation Genitourinary/Sexual: no complaints Ob/Gyn: vaginal spotting Musculoskeletal: back pain Hematology: no complaints Neurologic/Psych: numbness toes  Objective:  Physical Examination:  BP 120/66   Pulse 63   Temp 97.8 F (36.6 C)   Resp 19   Wt 190 lb 6.4 oz (86.4 kg)   SpO2 98%   BMI 33.73 kg/m    ECOG Performance Status: 0 - Asymptomatic  General appearance: alert, cooperative, and appears stated age HEENT:PERRLA and thyroid  without masses Lymph node survey: non-palpable, axillary, inguinal, supraclavicular Cardiovascular: regular rate and rhythm, no murmurs or gallops Respiratory: normal air entry, lungs clear to auscultation and no rales, rhonchi or wheezing Abdomen: soft, non-tender, without masses or organomegaly, no hernias, and well healed incision Back: inspection of back is normal Extremities: extremities normal, atraumatic, no cyanosis or edema Skin exam - normal coloration and turgor, no rashes, no suspicious skin lesions noted. Neurological exam reveals alert, oriented, normal speech, no focal findings or movement disorder noted.  Pelvic: EGBUS: no lesions Vagina:  atrophic with no lesions  Cervix: absent Uterus: absent BME: no palpable pelvic masses  Rectovaginal: confirmatory  Lab Review N/A  Radiologic Imaging: As per HPI  Assessment:  Kimberly Burke is a 83 y.o. female diagnosed with high grade endometrial cancer (pMMR, MSS, ER 1+; PR negative; Her2neu negative, p53 positive expression-mutant pattern), stage IA noninvasive endometrial cancer with washings that did not definitively demonstrate malignancy 06/19/20. Discussed VBT vs surveillance post op and patient elected for surveillance. Clinically, asymptomatic. NED on exam. Interval CT scan 11/22 and 4/25 were negative for radiographic evidence of disease. Underwent IMRT radiation therapy plus vaginal brachytherapy with Dr Chrystal 5-7/25 for small vaginal recurrence with negative imaging for distant disease.  Imaging negative for metastatic disease. No complaints today and NED.   Possible abdominal hernia versus laxity- imaging in November 2022 revealed small fat containing right lateral abdominal wall hernia, asymptomatic Back pain, suspect due to arthritis  Hepatic and renal cysts Bone density scan 7/23 shows osteopenia.   Follows with Rheumatology for RA.   Medical co-morbidities complicating care: HTN, h/o STEMI 2020 s/p stent placement (Dr. Fernand is her cardiologist), RA (Dr. Rona Glenn), hypothyroidism, There is no height or weight on file to calculate BMI.  Plan:   Problem List Items Addressed This Visit       Genitourinary   Endometrial cancer (HCC) - Primary    She will return to see Dr Lenn in 3 months and us  in 6 months, or sooner if any new concerning symptoms.   I personally had a face to face interaction and evaluated the patient jointly with the NP, Ms. Tinnie Dawn.  I have  Prentice Agent, MD

## 2023-10-11 DIAGNOSIS — M069 Rheumatoid arthritis, unspecified: Secondary | ICD-10-CM | POA: Diagnosis not present

## 2023-10-17 ENCOUNTER — Ambulatory Visit (INDEPENDENT_AMBULATORY_CARE_PROVIDER_SITE_OTHER): Admitting: Internal Medicine

## 2023-10-17 ENCOUNTER — Encounter: Payer: Self-pay | Admitting: Internal Medicine

## 2023-10-17 VITALS — BP 108/68 | HR 62 | Ht 63.0 in | Wt 189.4 lb

## 2023-10-17 DIAGNOSIS — M05732 Rheumatoid arthritis with rheumatoid factor of left wrist without organ or systems involvement: Secondary | ICD-10-CM

## 2023-10-17 DIAGNOSIS — I2510 Atherosclerotic heart disease of native coronary artery without angina pectoris: Secondary | ICD-10-CM

## 2023-10-17 DIAGNOSIS — E038 Other specified hypothyroidism: Secondary | ICD-10-CM | POA: Diagnosis not present

## 2023-10-17 DIAGNOSIS — I10 Essential (primary) hypertension: Secondary | ICD-10-CM | POA: Diagnosis not present

## 2023-10-17 DIAGNOSIS — R42 Dizziness and giddiness: Secondary | ICD-10-CM | POA: Insufficient documentation

## 2023-10-17 DIAGNOSIS — E559 Vitamin D deficiency, unspecified: Secondary | ICD-10-CM | POA: Diagnosis not present

## 2023-10-17 DIAGNOSIS — M05731 Rheumatoid arthritis with rheumatoid factor of right wrist without organ or systems involvement: Secondary | ICD-10-CM

## 2023-10-17 DIAGNOSIS — E782 Mixed hyperlipidemia: Secondary | ICD-10-CM | POA: Diagnosis not present

## 2023-10-17 DIAGNOSIS — R109 Unspecified abdominal pain: Secondary | ICD-10-CM | POA: Diagnosis not present

## 2023-10-17 DIAGNOSIS — I499 Cardiac arrhythmia, unspecified: Secondary | ICD-10-CM | POA: Insufficient documentation

## 2023-10-17 DIAGNOSIS — J301 Allergic rhinitis due to pollen: Secondary | ICD-10-CM

## 2023-10-17 MED ORDER — FOLIC ACID 1 MG PO TABS: 1.0000 mg | ORAL_TABLET | Freq: Every day | ORAL | 3 refills | Status: AC

## 2023-10-17 MED ORDER — HYDROCHLOROTHIAZIDE 12.5 MG PO CAPS: 12.5000 mg | ORAL_CAPSULE | Freq: Every day | ORAL | 2 refills | Status: DC

## 2023-10-17 MED ORDER — CELECOXIB 200 MG PO CAPS: 200.0000 mg | ORAL_CAPSULE | Freq: Every day | ORAL | 3 refills | Status: AC

## 2023-10-17 MED ORDER — MECLIZINE HCL 12.5 MG PO TABS: 12.5000 mg | ORAL_TABLET | Freq: Three times a day (TID) | ORAL | 0 refills | Status: AC | PRN

## 2023-10-17 MED ORDER — BACLOFEN 10 MG PO TABS: 10.0000 mg | ORAL_TABLET | Freq: Every day | ORAL | 1 refills | Status: AC

## 2023-10-17 MED ORDER — LOSARTAN POTASSIUM 25 MG PO TABS: 25.0000 mg | ORAL_TABLET | Freq: Every day | ORAL | 3 refills | Status: DC

## 2023-10-17 NOTE — Progress Notes (Signed)
 Established Patient Office Visit  Subjective:  Patient ID: Kimberly Burke, female    DOB: 02/28/1940  Age: 83 y.o. MRN: [id number]  Chief Complaint  Patient presents with   Follow-up    3 month follow up    Patient is here today for follow up. She reports she is doing well but has complaints of dizziness with position changes and lower leg edema. Her blood pressure is on the low side today; 108/68. She states she has taken her medication as prescribed. Will perform EKG today, and reduce HTCZ to 12.5 mg once daily. Will also start Meclizine  7.5 mg for vertigo. Encouraged patient to wear compression stockings daily and elevate legs throughout the day as much as she can.  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 Will order echo and carotid dopplers. Patient is a patient of Cardiology but has not been seen in over a year. Patient notified to FU with Cardiology after completing echo and carotid dopplers.  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    No other concerns at this time.   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 Caye at [facility]    CORONARY STENT INTERVENTION N/A 12/04/2018   Procedure: CORONARY STENT INTERVENTION;  Surgeon: Florencio Cara BIRCH, MD;  Location: [facility] INVASIVE CV [facility];  Service: Cardiovascular;  Laterality: N/A;   LEFT HEART CATH AND CORONARY ANGIOGRAPHY N/A 12/04/2018   Procedure: LEFT HEART CATH AND CORONARY ANGIOGRAPHY;  Surgeon: Fernand Denyse LABOR, MD;   Location: [facility] INVASIVE CV [facility];  Service: Cardiovascular;  Laterality: N/A;    Social History   Socioeconomic History   Marital status: Widowed    Spouse name: Not on file   Number of children: Not on file   Years of education: Not on file   Highest education level: Not on file  Occupational History   Not on file  Tobacco Use   Smoking status: Former   Smokeless tobacco: Never  Vaping Use   Vaping status: Never Used  Substance and Sexual Activity   Alcohol use: Never   Drug use: Never   Sexual activity: Not Currently  Other Topics Concern   Not on file  Social History Narrative   Lives with daughter   Social Drivers of Health   Financial Resource Strain: Low Risk  (09/02/2021)   Overall Financial Resource Strain (CARDIA)    Difficulty of Paying Living Expenses: Not hard at all  Food Insecurity: No Food Insecurity (09/09/2021)   Hunger Vital Sign    Worried About Running Out of Food in the Last Year: Never true    Ran Out of Food in the Last Year: Never true  Transportation Needs: No Transportation Needs (09/09/2021)   PRAPARE - Administrator, Civil Service (Medical): No    Lack of Transportation (Non-Medical): No  Physical Activity: Insufficiently Active (09/02/2021)   Exercise Vital Sign    Days of Exercise per Week: 5 days  Minutes of Exercise per Session: 10 min  Stress: No Stress Concern Present (09/02/2021)   [employer] of Occupational Health - Occupational Stress Questionnaire    Feeling of Stress : Not at all  Social Connections: Moderately Integrated (09/02/2021)   Social Connection and Isolation Panel    Frequency of Communication with Friends and Family: More than three times a week    Frequency of Social Gatherings with Friends and Family: More than three times a week    Attends Religious Services: More than 4 times per year    Active Member of [employer] or Organizations: Yes    Attends Banker Meetings: More than 4 times per  year    Marital Status: Widowed  Catering manager Violence: Not on file    Family History  Problem Relation Age of Onset   Cancer Brother        prostate or colon unsure which    Breast cancer Neg Hx     Allergies  Allergen Reactions   Codeine Itching   Penicillins Other (See Comments)    unknown    Outpatient Medications Prior to Visit  Medication Sig   acetaminophen  (TYLENOL ) 650 MG CR tablet Take 1,300 mg by mouth daily.   aspirin  81 MG chewable tablet Chew 1 tablet (81 mg total) by mouth daily.   BRILINTA  60 MG TABS tablet TAKE 1 TABLET(60 MG) BY MOUTH TWICE DAILY   certolizumab pegol  (CIMZIA ) 2 X 200 MG/ML PSKT prefilled syringe Inject into the skin.   DULoxetine  (CYMBALTA ) 30 MG capsule TAKE 1 CAPSULE BY MOUTH EVERY DAY FOR MOOD   fluticasone  (FLONASE ) 50 MCG/ACT nasal spray Place 1 spray into both nostrils daily.   gabapentin (NEURONTIN) 100 MG capsule Take 100 mg by mouth 3 (three) times daily.   hydroxychloroquine (PLAQUENIL) 200 MG tablet Take by mouth.   levothyroxine  (SYNTHROID ) 50 MCG tablet Take 1 tablet (50 mcg total) by mouth daily.   methotrexate (RHEUMATREX) 2.5 MG tablet Take 15 mg by mouth once a week.   metoprolol  succinate (TOPROL -XL) 100 MG 24 hr tablet TAKE 1 TABLET BY MOUTH EVERY DAY   metoprolol  succinate (TOPROL -XL) 50 MG 24 hr tablet TAKE 1 TABLET BY MOUTH EVERY DAY ALONG WITH 100 MG AS TOTAL 150 MG AS DIRECTED   rosuvastatin  (CRESTOR ) 40 MG tablet TAKE 1 TABLET BY MOUTH EVERY DAY   [DISCONTINUED] celecoxib  (CELEBREX ) 200 MG capsule Take 1 capsule (200 mg total) by mouth daily.   [DISCONTINUED] folic acid  (FOLVITE ) 1 MG tablet Take 1 tablet (1 mg total) by mouth daily.   [DISCONTINUED] hydrochlorothiazide  (HYDRODIURIL ) 25 MG tablet TAKE 1 TABLET(25 MG) BY MOUTH DAILY   [DISCONTINUED] losartan  (COZAAR ) 25 MG tablet Take 1 tablet (25 mg total) by mouth daily.   Cholecalciferol 1.25 MG (50000 UT) capsule Take by mouth. (Patient not taking: Reported on  10/17/2023)   ondansetron  (ZOFRAN -ODT) 4 MG disintegrating tablet Take 1 tablet (4 mg total) by mouth every 8 (eight) hours as needed for nausea or vomiting. (Patient not taking: Reported on 10/17/2023)   pantoprazole  (PROTONIX ) 40 MG tablet Take 1 tablet (40 mg total) by mouth daily. (Patient not taking: Reported on 10/17/2023)   [DISCONTINUED] baclofen  (LIORESAL ) 10 MG tablet Take 1 tablet (10 mg total) by mouth daily. (Patient not taking: Reported on 10/17/2023)   No facility-administered medications prior to visit.    Review of Systems  Constitutional:  Positive for malaise/fatigue. Negative for chills and fever.  HENT: Negative.  Negative for congestion and  sore throat.   Eyes: Negative.  Negative for blurred vision and pain.  Respiratory: Negative.  Negative for cough and shortness of breath.   Cardiovascular:  Positive for leg swelling (reports wearing compression stockings regularly; none today). Negative for chest pain and palpitations.  Gastrointestinal: Negative.  Negative for abdominal pain, blood in stool, constipation, diarrhea, heartburn, melena, nausea and vomiting.  Genitourinary: Negative.  Negative for dysuria, flank pain, frequency and urgency.  Musculoskeletal: Negative.  Negative for joint pain and myalgias.  Skin: Negative.   Neurological:  Positive for dizziness (with position changes). Negative for tingling, sensory change, weakness and headaches.  Endo/Heme/Allergies: Negative.   Psychiatric/Behavioral: Negative.  Negative for depression and suicidal ideas. The patient is not nervous/anxious.        Objective:   BP 108/68   Pulse 62   Ht 5' 3 (1.6 m)   Wt 189 lb 6.4 oz (85.9 kg)   SpO2 96%   BMI 33.55 kg/m   Vitals:   10/17/23 0944  BP: 108/68  Pulse: 62  Height: 5' 3 (1.6 m)  Weight: 189 lb 6.4 oz (85.9 kg)  SpO2: 96%  BMI (Calculated): 33.56    Physical Exam Vitals and nursing note reviewed.  Constitutional:      Appearance: Normal appearance.   HENT:     Head: Normocephalic and atraumatic.     Nose: Nose normal.     Mouth/Throat:     Mouth: Mucous membranes are moist.     Pharynx: Oropharynx is clear.  Eyes:     Conjunctiva/sclera: Conjunctivae normal.     Pupils: Pupils are equal, round, and reactive to light.  Cardiovascular:     Rate and Rhythm: Normal rate. Rhythm irregular.     Pulses: Normal pulses.     Heart sounds: Normal heart sounds. No murmur heard. Pulmonary:     Effort: Pulmonary effort is normal.     Breath sounds: Normal breath sounds. No wheezing.  Abdominal:     General: Bowel sounds are normal.     Palpations: Abdomen is soft.     Tenderness: There is no abdominal tenderness. There is no right CVA tenderness or left CVA tenderness.  Musculoskeletal:        General: Normal range of motion.     Cervical back: Normal range of motion.     Right lower leg: No edema.     Left lower leg: No edema.  Skin:    General: Skin is warm and dry.  Neurological:     General: No focal deficit present.     Mental Status: She is alert and oriented to person, place, and time.  Psychiatric:        Mood and Affect: Mood normal.        Behavior: Behavior normal.      No results found for any visits on 10/17/23.  Recent Results (from the past 2160 hours)  Rad Onc Aria Session Summary     Status: None   Collection Time: 07/26/23 10:05 AM  Result Value Ref Range   Course ID [email or web address]    Course Intent Curative    Course Start Date 05/31/2023    Session Number 26    Course First Treatment Date 06/12/2023 10:55 AM    Course Last Treatment Date 07/26/2023  9:52 AM    Course Elapsed Days 44    Reference Point ID Bladder Max    Reference Point Dosage Given to Date 7.088952 Gy   Reference Point Session Dosage  Given 2.911047 Gy   Reference Point ID Bowel Max    Reference Point Dosage Given to Date 8.862311 Gy   Reference Point Session Dosage Given 8.862311 Gy   Reference Point ID Colon Max    Reference Point Dosage  Given to Date 8.243675 Gy   Reference Point Session Dosage Given 8.243675 Gy   Reference Point ID Pelvis HDR DP    Reference Point Dosage Given to Date 4 Gy   Reference Point Session Dosage Given 4 Gy   Reference Point ID Rectum Max    Reference Point Dosage Given to Date 5.028631 Gy   Reference Point Session Dosage Given 5.028631 Gy   Plan ID Vag_Cuff_HDR    Plan Fractions Treated to Date 1    Plan Total Fractions Prescribed 3    Plan Prescribed Dose Per Fraction 4 Gy   Plan Total Prescribed Dose 12.000000 Gy   Plan Primary Reference Point Pelvis HDR DP   Rad Onc Aria Session Summary     Status: None   Collection Time: 07/31/23 10:10 AM  Result Value Ref Range   Course ID [email or web address]    Course Intent Curative    Course Start Date 05/31/2023    Session Number 27    Course First Treatment Date 06/12/2023 10:55 AM    Course Last Treatment Date 07/31/2023 10:06 AM    Course Elapsed Days 49    Reference Point ID Bladder Max    Reference Point Dosage Given to Date 85.822095 Gy   Reference Point Session Dosage Given 2.911047 Gy   Reference Point ID Bowel Max    Reference Point Dosage Given to Date 2.275376 Gy   Reference Point Session Dosage Given 8.862311 Gy   Reference Point ID Colon Max    Reference Point Dosage Given to Date 3.512648 Gy   Reference Point Session Dosage Given 8.243675 Gy   Reference Point ID Pelvis HDR DP    Reference Point Dosage Given to Date 8 Gy   Reference Point Session Dosage Given 4 Gy   Reference Point ID Rectum Max    Reference Point Dosage Given to Date 0.057263 Gy   Reference Point Session Dosage Given 5.028631 Gy   Plan ID Vag_Cuff_HDR    Plan Fractions Treated to Date 2    Plan Total Fractions Prescribed 3    Plan Prescribed Dose Per Fraction 4 Gy   Plan Total Prescribed Dose 12.000000 Gy   Plan Primary Reference Point Pelvis HDR DP   Rad Onc Aria Session Summary     Status: None   Collection Time: 08/03/23 10:00 AM  Result Value Ref Range   Course  ID [email or web address]    Course Intent Curative    Course Start Date 05/31/2023    Session Number 28    Course First Treatment Date 06/12/2023 10:55 AM    Course Last Treatment Date 08/03/2023  9:46 AM    Course Elapsed Days 52    Reference Point ID Bladder Max    Reference Point Dosage Given to Date 21.266856 Gy   Reference Point Session Dosage Given 2.911047 Gy   Reference Point ID Bowel Max    Reference Point Dosage Given to Date 3.413064 Gy   Reference Point Session Dosage Given 8.862311 Gy   Reference Point ID Colon Max    Reference Point Dosage Given to Date 5.268972 Gy   Reference Point Session Dosage Given 8.243675 Gy   Reference Point ID Pelvis HDR DP    Reference Point Dosage Given to  Date 12 Gy   Reference Point Session Dosage Given 4 Gy   Reference Point ID Rectum Max    Reference Point Dosage Given to Date 85.085895 Gy   Reference Point Session Dosage Given 5.028631 Gy   Plan ID Vag_Cuff_HDR    Plan Fractions Treated to Date 3    Plan Total Fractions Prescribed 3    Plan Prescribed Dose Per Fraction 4 Gy   Plan Total Prescribed Dose 12.000000 Gy   Plan Primary Reference Point Pelvis HDR DP   CMP14+EGFR     Status: Abnormal   Collection Time: 08/08/23  9:58 AM  Result Value Ref Range   Glucose 96 70 - 99 mg/dL   BUN 16 8 - 27 mg/dL   Creatinine, Ser 9.14 0.57 - 1.00 mg/dL   eGFR 69 >40 fO/fpw/8.26   BUN/Creatinine Ratio 19 12 - 28   Sodium 139 134 - 144 mmol/L   Potassium 3.8 3.5 - 5.2 mmol/L   Chloride 100 96 - 106 mmol/L   CO2 25 20 - 29 mmol/L   Calcium  10.7 (H) 8.7 - 10.3 mg/dL   Total Protein 7.2 6.0 - 8.5 g/dL   Albumin 4.2 3.7 - 4.7 g/dL   Globulin, Total 3.0 1.5 - 4.5 g/dL   Bilirubin Total 0.5 0.0 - 1.2 mg/dL   Alkaline Phosphatase 79 44 - 121 IU/L   AST 23 0 - 40 IU/L   ALT 21 0 - 32 IU/L  CBC with Diff     Status: Abnormal   Collection Time: 08/08/23  9:58 AM  Result Value Ref Range   WBC 4.4 3.4 - 10.8 x10E3/uL   RBC 3.55 (L) 3.77 - 5.28 x10E6/uL    Hemoglobin 11.4 11.1 - 15.9 g/dL   Hematocrit 64.0 65.9 - 46.6 %   MCV 101 (H) 79 - 97 fL   MCH 32.1 26.6 - 33.0 pg   MCHC 31.8 31.5 - 35.7 g/dL   RDW 84.4 (H) 88.2 - 84.5 %   Platelets 212 150 - 450 x10E3/uL   Neutrophils 73 Not Estab. %   Lymphs 12 Not Estab. %   Monocytes 9 Not Estab. %   Eos 5 Not Estab. %   Basos 1 Not Estab. %   Neutrophils Absolute 3.2 1.4 - 7.0 x10E3/uL   Lymphocytes Absolute 0.5 (L) 0.7 - 3.1 x10E3/uL   Monocytes Absolute 0.4 0.1 - 0.9 x10E3/uL   EOS (ABSOLUTE) 0.2 0.0 - 0.4 x10E3/uL   Basophils Absolute 0.0 0.0 - 0.2 x10E3/uL   Immature Granulocytes 0 Not Estab. %   Immature Grans (Abs) 0.0 0.0 - 0.1 x10E3/uL  TSH+T4F+T3Free     Status: None   Collection Time: 08/08/23  9:58 AM  Result Value Ref Range   TSH 1.770 0.450 - 4.500 uIU/mL   T3, Free 3.0 2.0 - 4.4 pg/mL   Free T4 1.30 0.82 - 1.77 ng/dL  Lipid Panel w/o Chol/HDL Ratio     Status: Abnormal   Collection Time: 08/08/23  9:58 AM  Result Value Ref Range   Cholesterol, Total 76 (L) 100 - 199 mg/dL   Triglycerides 95 0 - 149 mg/dL   HDL 30 (L) >60 mg/dL   VLDL Cholesterol Cal 19 5 - 40 mg/dL   LDL Chol Calc (NIH) 27 0 - 99 mg/dL      Assessment & Plan:  Stop hydrochlorothiazide  25 mg and start taking hydrochlorothiazide  12.5 mg once daily. Start taking Meclizine  7.5 mg. Refills sent. Will order future fasting labs as  they are not due until October. Will order EKG, Echo, and carotid dopplers. Recommended Cardiology FU. Problem List Items Addressed This Visit     Hypothyroid   Relevant Orders   TSH+T4F+T3Free   Essential hypertension, benign - Primary   Relevant Medications   losartan  (COZAAR ) 25 MG tablet   hydrochlorothiazide  (MICROZIDE ) 12.5 MG capsule   Other Relevant Orders   EKG 12-Lead   CBC with Differential/Platelet   CMP14+EGFR   Lipid Panel w/o Chol/HDL Ratio   Hemoglobin A1c   PCV ECHOCARDIOGRAM COMPLETE   US  Carotid Duplex Bilateral   Rheumatoid arthritis (HCC)    Relevant Medications   baclofen  (LIORESAL ) 10 MG tablet   celecoxib  (CELEBREX ) 200 MG capsule   folic acid  (FOLVITE ) 1 MG tablet   Coronary artery disease involving native coronary artery of native heart   Relevant Medications   losartan  (COZAAR ) 25 MG tablet   hydrochlorothiazide  (MICROZIDE ) 12.5 MG capsule   Other Relevant Orders   PCV ECHOCARDIOGRAM COMPLETE   US  Carotid Duplex Bilateral   Seasonal allergic rhinitis due to pollen   Mixed hyperlipidemia   Relevant Medications   losartan  (COZAAR ) 25 MG tablet   hydrochlorothiazide  (MICROZIDE ) 12.5 MG capsule   Other Relevant Orders   Lipid Panel w/o Chol/HDL Ratio   Hemoglobin A1c   Flank pain   Relevant Medications   baclofen  (LIORESAL ) 10 MG tablet   celecoxib  (CELEBREX ) 200 MG capsule   Vitamin D  deficiency   Relevant Orders   Vitamin D  (25 hydroxy)   Cardiac arrhythmia   Relevant Medications   losartan  (COZAAR ) 25 MG tablet   hydrochlorothiazide  (MICROZIDE ) 12.5 MG capsule   Other Relevant Orders   EKG 12-Lead   PCV ECHOCARDIOGRAM COMPLETE   US  Carotid Duplex Bilateral   Vertigo   Relevant Medications   meclizine  (ANTIVERT ) 12.5 MG tablet   Other Relevant Orders   EKG 12-Lead    Return in about 2 weeks (around 10/31/2023).   Total time spent: 30 minutes  FERNAND FREDY RAMAN, MD  10/17/2023   This document may have been prepared by [facility] Voice Recognition software and as such may include unintentional dictation errors.

## 2023-10-24 ENCOUNTER — Ambulatory Visit
Admission: RE | Admit: 2023-10-24 | Discharge: 2023-10-24 | Disposition: A | Discharge status: Home / Self Care | Source: Ambulatory Visit | Attending: Internal Medicine | Admitting: Internal Medicine

## 2023-10-24 DIAGNOSIS — Z79899 Other long term (current) drug therapy: Secondary | ICD-10-CM | POA: Diagnosis not present

## 2023-10-24 DIAGNOSIS — I10 Essential (primary) hypertension: Secondary | ICD-10-CM | POA: Diagnosis not present

## 2023-10-24 DIAGNOSIS — I6522 Occlusion and stenosis of left carotid artery: Secondary | ICD-10-CM | POA: Diagnosis not present

## 2023-10-24 DIAGNOSIS — I2510 Atherosclerotic heart disease of native coronary artery without angina pectoris: Secondary | ICD-10-CM | POA: Diagnosis not present

## 2023-10-24 DIAGNOSIS — R42 Dizziness and giddiness: Secondary | ICD-10-CM | POA: Diagnosis not present

## 2023-10-24 DIAGNOSIS — M159 Polyosteoarthritis, unspecified: Secondary | ICD-10-CM | POA: Diagnosis not present

## 2023-10-24 DIAGNOSIS — M0579 Rheumatoid arthritis with rheumatoid factor of multiple sites without organ or systems involvement: Secondary | ICD-10-CM | POA: Diagnosis not present

## 2023-10-24 DIAGNOSIS — I499 Cardiac arrhythmia, unspecified: Secondary | ICD-10-CM | POA: Diagnosis not present

## 2023-10-24 DIAGNOSIS — M170 Bilateral primary osteoarthritis of knee: Secondary | ICD-10-CM | POA: Diagnosis not present

## 2023-10-25 ENCOUNTER — Ambulatory Visit: Payer: Self-pay | Admitting: Internal Medicine

## 2023-10-26 ENCOUNTER — Ambulatory Visit (INDEPENDENT_AMBULATORY_CARE_PROVIDER_SITE_OTHER): Admitting: Internal Medicine

## 2023-10-26 ENCOUNTER — Encounter: Payer: Self-pay | Admitting: Internal Medicine

## 2023-10-26 DIAGNOSIS — Z23 Encounter for immunization: Secondary | ICD-10-CM | POA: Diagnosis not present

## 2023-10-26 NOTE — Progress Notes (Signed)
 Flu shot only

## 2023-10-31 ENCOUNTER — Ambulatory Visit: Admitting: Internal Medicine

## 2023-10-31 ENCOUNTER — Telehealth: Payer: Self-pay

## 2023-10-31 ENCOUNTER — Encounter: Payer: Self-pay | Admitting: Internal Medicine

## 2023-10-31 ENCOUNTER — Ambulatory Visit: Payer: Self-pay | Admitting: Internal Medicine

## 2023-10-31 VITALS — BP 120/72 | HR 72 | Ht 63.0 in | Wt 193.0 lb

## 2023-10-31 DIAGNOSIS — I10 Essential (primary) hypertension: Secondary | ICD-10-CM | POA: Diagnosis not present

## 2023-10-31 DIAGNOSIS — R10A1 Flank pain, right side: Secondary | ICD-10-CM

## 2023-10-31 DIAGNOSIS — E782 Mixed hyperlipidemia: Secondary | ICD-10-CM

## 2023-10-31 DIAGNOSIS — H6120 Impacted cerumen, unspecified ear: Secondary | ICD-10-CM | POA: Diagnosis not present

## 2023-10-31 DIAGNOSIS — I214 Non-ST elevation (NSTEMI) myocardial infarction: Secondary | ICD-10-CM | POA: Diagnosis not present

## 2023-10-31 DIAGNOSIS — R42 Dizziness and giddiness: Secondary | ICD-10-CM

## 2023-10-31 DIAGNOSIS — Z1389 Encounter for screening for other disorder: Secondary | ICD-10-CM | POA: Diagnosis not present

## 2023-10-31 DIAGNOSIS — I2510 Atherosclerotic heart disease of native coronary artery without angina pectoris: Secondary | ICD-10-CM

## 2023-10-31 LAB — POCT URINALYSIS DIPSTICK
Bilirubin, UA: NEGATIVE
Glucose, UA: NEGATIVE
Leukocytes, UA: NEGATIVE
Nitrite, UA: NEGATIVE
Protein, UA: POSITIVE — AB
Spec Grav, UA: >=1.03 — AB (ref 1.010–1.025)
Urobilinogen, UA: 1 U/dL
pH, UA: 6 (ref 5.0–8.0)

## 2023-10-31 MED ORDER — ROSUVASTATIN CALCIUM 40 MG PO TABS: 40.0000 mg | ORAL_TABLET | Freq: Every day | ORAL | 3 refills | Status: AC

## 2023-10-31 MED ORDER — BRILINTA 60 MG PO TABS: 60.0000 mg | ORAL_TABLET | Freq: Two times a day (BID) | ORAL | 3 refills | Status: DC

## 2023-10-31 MED ORDER — METOPROLOL SUCCINATE ER 100 MG PO TB24: 100.0000 mg | ORAL_TABLET | Freq: Every day | ORAL | 0 refills | Status: DC

## 2023-10-31 NOTE — Progress Notes (Signed)
   10/31/2023  Patient ID: Kimberly Burke, female   DOB: 12-27-1940, 83 y.o.   MRN: [id number]  Received message from front office staff that patient was inquiring about patient assistance paperwork.  Contacted patient to clarify. Patient reports she is not having any issues obtaining medications at this time but last year her prices jumped up unexpectedly at one point.  Explained to patient that she was likely in the coverage gap or donut hole for her drug coverage at that point in 2024. Explained that this does not exist for 2025 and should not be a concern this year.  Instructed patient to reach back out if she runs into any issues obtaining her medications. She expressed understanding.  Jon VEAR Lindau, PharmD Clinical Pharmacist [phone]

## 2023-10-31 NOTE — Progress Notes (Signed)
 Established Patient Office Visit  Subjective:  Patient ID: Kimberly Burke, female    DOB: August 01, 1940  Age: 83 y.o. MRN: [id number]  Chief Complaint  Patient presents with   Follow-up    2 week follow up    Patient is here today for follow up. She reports doing ok since her last visit. She has taken her medications as prescribed including reducing her hydrochlorothiazide  to 12.5 mg daily. She has gained 5 lbs since her last visit with minimal BLE. Her blood pressure has improved today. Will not change BP medication at this time. Patient had carotid dopplers completed; left ICA showed less than 50% blockage. Echo is scheduled for 11/10/23 and Cardiology Fu is on 11/13/23.  She has complaints of right flank pain/lower back pain today. Will check urine sample to r/o UTI. UA was normal.  Patient reports vertigo has improved some since starting Meclizine  12.5 mg as needed. Patient reports that dizziness is worse when she lays on her right side or leans to her right. Both ear canals have cerumen impaction. Will do debrox drops for 1 week and return for ear flush at that time.  Patient's labs are due in 1 month.    No other concerns at this time.   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 Caye at [facility]    CORONARY STENT INTERVENTION N/A 12/04/2018   Procedure: CORONARY STENT INTERVENTION;  Surgeon: Florencio Cara BIRCH, MD;  Location: [facility] INVASIVE CV [facility];  Service: Cardiovascular;  Laterality: N/A;   LEFT HEART CATH AND CORONARY ANGIOGRAPHY N/A 12/04/2018   Procedure: LEFT HEART CATH AND CORONARY  ANGIOGRAPHY;  Surgeon: Fernand Denyse LABOR, MD;  Location: [facility] INV[facility]IVE CV [facility];  Service: Cardiovascular;  Laterality: N/A;    Social History   Socioeconomic History   Marital status: Widowed    Spouse name: Not on file   Number of children: Not on file   Years of education: Not on file   Highest education level: Not on file  Occupational History   Not on file  Tobacco Use   Smoking status: Former   Smokeless tobacco: Never  Vaping Use   Vaping status: Never Used  Substance and Sexual Activity   Alcohol use: Never   Drug use: Never   Sexual activity: Not Currently  Other Topics Concern   Not on file  Social History Narrative   Lives with daughter   Social Drivers of Health   Financial Resource Strain: Low Risk  (09/02/2021)   Overall Financial Resource Strain (CARDIA)    Difficulty of Paying Living Expenses: Not hard at all  Food Insecurity: No Food Insecurity (09/09/2021)   Hunger Vital Sign    Worried About Running Out of Food in the Last Year: Never true    Ran Out of Food in the Last Year: Never true  Transportation Needs: No Transportation Needs (09/09/2021)   PRAPARE - Administrator, Civil Service (Medical): No    Lack of Transportation (Non-Medical): No  Physical Activity: Insufficiently Active (09/02/2021)   Exercise Vital Sign  Days of Exercise per Week: 5 days    Minutes of Exercise per Session: 10 min  Stress: No Stress Concern Present (09/02/2021)   [employer] of Occupational Health - Occupational Stress Questionnaire    Feeling of Stress : Not at all  Social Connections: Moderately Integrated (09/02/2021)   Social Connection and Isolation Panel    Frequency of Communication with Friends and Family: More than three times a week    Frequency of Social Gatherings with Friends and Family: More than three times a week    Attends Religious Services: More than 4 times per year    Active Member of [employer] or Organizations: Yes    Attends Tax inspector Meetings: More than 4 times per year    Marital Status: Widowed  Catering manager Violence: Not on file    Family History  Problem Relation Age of Onset   Cancer Brother        prostate or colon unsure which    Breast cancer Neg Hx     Allergies  Allergen Reactions   Codeine Itching   Penicillins Other (See Comments)    unknown    Outpatient Medications Prior to Visit  Medication Sig   acetaminophen  (TYLENOL ) 650 MG CR tablet Take 1,300 mg by mouth daily.   aspirin  81 MG chewable tablet Chew 1 tablet (81 mg total) by mouth daily.   baclofen  (LIORESAL ) 10 MG tablet Take 1 tablet (10 mg total) by mouth daily.   celecoxib  (CELEBREX ) 200 MG capsule Take 1 capsule (200 mg total) by mouth daily.   certolizumab pegol  (CIMZIA ) 2 X 200 MG/ML PSKT prefilled syringe Inject into the skin.   Cholecalciferol 1.25 MG (50000 UT) capsule Take by mouth. (Patient not taking: Reported on 10/17/2023)   DULoxetine  (CYMBALTA ) 30 MG capsule TAKE 1 CAPSULE BY MOUTH EVERY DAY FOR MOOD   fluticasone  (FLONASE ) 50 MCG/ACT nasal spray Place 1 spray into both nostrils daily.   folic acid  (FOLVITE ) 1 MG tablet Take 1 tablet (1 mg total) by mouth daily.   gabapentin (NEURONTIN) 100 MG capsule Take 100 mg by mouth 3 (three) times daily.   hydrochlorothiazide  (MICROZIDE ) 12.5 MG capsule Take 1 capsule (12.5 mg total) by mouth daily.   hydroxychloroquine (PLAQUENIL) 200 MG tablet Take by mouth.   levothyroxine  (SYNTHROID ) 50 MCG tablet Take 1 tablet (50 mcg total) by mouth daily.   losartan  (COZAAR ) 25 MG tablet Take 1 tablet (25 mg total) by mouth daily.   meclizine  (ANTIVERT ) 12.5 MG tablet Take 1 tablet (12.5 mg total) by mouth 3 (three) times daily as needed for dizziness.   methotrexate (RHEUMATREX) 2.5 MG tablet Take 15 mg by mouth once a week.   ondansetron  (ZOFRAN -ODT) 4 MG disintegrating tablet Take 1 tablet (4 mg total) by mouth every 8 (eight) hours as needed for nausea or vomiting. (Patient  not taking: Reported on 10/17/2023)   pantoprazole  (PROTONIX ) 40 MG tablet Take 1 tablet (40 mg total) by mouth daily. (Patient not taking: Reported on 10/17/2023)   [DISCONTINUED] BRILINTA  60 MG TABS tablet TAKE 1 TABLET(60 MG) BY MOUTH TWICE DAILY   [DISCONTINUED] metoprolol  succinate (TOPROL -XL) 100 MG 24 hr tablet TAKE 1 TABLET BY MOUTH EVERY DAY   [DISCONTINUED] metoprolol  succinate (TOPROL -XL) 50 MG 24 hr tablet TAKE 1 TABLET BY MOUTH EVERY DAY ALONG WITH 100 MG AS TOTAL 150 MG AS DIRECTED   [DISCONTINUED] rosuvastatin  (CRESTOR ) 40 MG tablet TAKE 1 TABLET BY MOUTH EVERY DAY   No facility-administered medications  prior to visit.    Review of Systems  Constitutional: Negative.  Negative for chills, fever and malaise/fatigue.  HENT: Negative.  Negative for congestion and sore throat.   Eyes: Negative.  Negative for blurred vision and pain.  Respiratory: Negative.  Negative for cough and shortness of breath.   Cardiovascular: Negative.  Negative for chest pain, palpitations and leg swelling.  Gastrointestinal: Negative.  Negative for abdominal pain, blood in stool, constipation, diarrhea, heartburn, melena, nausea and vomiting.  Genitourinary:  Positive for flank pain (right sided). Negative for dysuria, frequency and urgency.  Musculoskeletal:  Positive for back pain (lower right side). Negative for joint pain and myalgias.  Skin: Negative.   Neurological:  Positive for dizziness. Negative for tingling, sensory change, weakness and headaches.  Endo/Heme/Allergies: Negative.   Psychiatric/Behavioral: Negative.  Negative for depression and suicidal ideas. The patient is not nervous/anxious.        Objective:   BP 120/72   Pulse 72   Ht 5' 3 (1.6 m)   Wt 193 lb (87.5 kg)   SpO2 95%   BMI 34.19 kg/m   Vitals:   10/31/23 1009  BP: 120/72  Pulse: 72  Height: 5' 3 (1.6 m)  Weight: 193 lb (87.5 kg)  SpO2: 95%  BMI (Calculated): 34.2    Physical Exam Vitals and nursing note  reviewed.  Constitutional:      Appearance: Normal appearance.  HENT:     Head: Normocephalic and atraumatic.     Right Ear: There is impacted cerumen.     Left Ear: There is impacted cerumen.     Nose: Nose normal.     Mouth/Throat:     Mouth: Mucous membranes are moist.     Pharynx: Oropharynx is clear.  Eyes:     Conjunctiva/sclera: Conjunctivae normal.     Pupils: Pupils are equal, round, and reactive to light.  Cardiovascular:     Rate and Rhythm: Normal rate and regular rhythm.     Pulses: Normal pulses.     Heart sounds: Normal heart sounds. No murmur heard. Pulmonary:     Effort: Pulmonary effort is normal.     Breath sounds: Normal breath sounds. No wheezing.  Abdominal:     General: Bowel sounds are normal.     Palpations: Abdomen is soft.     Tenderness: There is no abdominal tenderness. There is no right CVA tenderness or left CVA tenderness.  Musculoskeletal:        General: Normal range of motion.     Cervical back: Normal range of motion.     Right lower leg: No edema.     Left lower leg: No edema.  Skin:    General: Skin is warm and dry.  Neurological:     General: No focal deficit present.     Mental Status: She is alert and oriented to person, place, and time.  Psychiatric:        Mood and Affect: Mood normal.        Behavior: Behavior normal.      Results for orders placed or performed in visit on 10/31/23  POCT Urinalysis Dipstick (81002)  Result Value Ref Range   Color, UA     Clarity, UA Cloudy    Glucose, UA Negative Negative   Bilirubin, UA Negative    Ketones, UA Trace    Spec Grav, UA >=1.030 (A) 1.010 - 1.025   Blood, UA Trace-intact    pH, UA 6.0 5.0 - 8.0  Protein, UA Positive (A) Negative   Urobilinogen, UA 1.0 0.2 or 1.0 E.U./dL   Nitrite, UA Negative    Leukocytes, UA Negative Negative   Appearance     Odor      Recent Results (from the past 2160 hours)  Rad Onc Aria Session Summary     Status: None   Collection Time:  08/03/23 10:00 AM  Result Value Ref Range   Course ID [email or web address]    Course Intent Curative    Course Start Date 05/31/2023    Session Number 28    Course First Treatment Date 06/12/2023 10:55 AM    Course Last Treatment Date 08/03/2023  9:46 AM    Course Elapsed Days 52    Reference Point ID Bladder Max    Reference Point Dosage Given to Date 21.266856 Gy   Reference Point Session Dosage Given 2.911047 Gy   Reference Point ID Bowel Max    Reference Point Dosage Given to Date 3.413064 Gy   Reference Point Session Dosage Given 8.862311 Gy   Reference Point ID Colon Max    Reference Point Dosage Given to Date 5.268972 Gy   Reference Point Session Dosage Given 8.243675 Gy   Reference Point ID Pelvis HDR DP    Reference Point Dosage Given to Date 12 Gy   Reference Point Session Dosage Given 4 Gy   Reference Point ID Rectum Max    Reference Point Dosage Given to Date 85.085895 Gy   Reference Point Session Dosage Given 5.028631 Gy   Plan ID Vag_Cuff_HDR    Plan Fractions Treated to Date 3    Plan Total Fractions Prescribed 3    Plan Prescribed Dose Per Fraction 4 Gy   Plan Total Prescribed Dose 12.000000 Gy   Plan Primary Reference Point Pelvis HDR DP   CMP14+EGFR     Status: Abnormal   Collection Time: 08/08/23  9:58 AM  Result Value Ref Range   Glucose 96 70 - 99 mg/dL   BUN 16 8 - 27 mg/dL   Creatinine, Ser 9.14 0.57 - 1.00 mg/dL   eGFR 69 >40 fO/fpw/8.26   BUN/Creatinine Ratio 19 12 - 28   Sodium 139 134 - 144 mmol/L   Potassium 3.8 3.5 - 5.2 mmol/L   Chloride 100 96 - 106 mmol/L   CO2 25 20 - 29 mmol/L   Calcium  10.7 (H) 8.7 - 10.3 mg/dL   Total Protein 7.2 6.0 - 8.5 g/dL   Albumin 4.2 3.7 - 4.7 g/dL   Globulin, Total 3.0 1.5 - 4.5 g/dL   Bilirubin Total 0.5 0.0 - 1.2 mg/dL   Alkaline Phosphatase 79 44 - 121 IU/L   AST 23 0 - 40 IU/L   ALT 21 0 - 32 IU/L  CBC with Diff     Status: Abnormal   Collection Time: 08/08/23  9:58 AM  Result Value Ref Range   WBC 4.4 3.4 - 10.8  x10E3/uL   RBC 3.55 (L) 3.77 - 5.28 x10E6/uL   Hemoglobin 11.4 11.1 - 15.9 g/dL   Hematocrit 64.0 65.9 - 46.6 %   MCV 101 (H) 79 - 97 fL   MCH 32.1 26.6 - 33.0 pg   MCHC 31.8 31.5 - 35.7 g/dL   RDW 84.4 (H) 88.2 - 84.5 %   Platelets 212 150 - 450 x10E3/uL   Neutrophils 73 Not Estab. %   Lymphs 12 Not Estab. %   Monocytes 9 Not Estab. %   Eos 5 Not Estab. %  Basos 1 Not Estab. %   Neutrophils Absolute 3.2 1.4 - 7.0 x10E3/uL   Lymphocytes Absolute 0.5 (L) 0.7 - 3.1 x10E3/uL   Monocytes Absolute 0.4 0.1 - 0.9 x10E3/uL   EOS (ABSOLUTE) 0.2 0.0 - 0.4 x10E3/uL   Basophils Absolute 0.0 0.0 - 0.2 x10E3/uL   Immature Granulocytes 0 Not Estab. %   Immature Grans (Abs) 0.0 0.0 - 0.1 x10E3/uL  TSH+T4F+T3Free     Status: None   Collection Time: 08/08/23  9:58 AM  Result Value Ref Range   TSH 1.770 0.450 - 4.500 uIU/mL   T3, Free 3.0 2.0 - 4.4 pg/mL   Free T4 1.30 0.82 - 1.77 ng/dL  Lipid Panel w/o Chol/HDL Ratio     Status: Abnormal   Collection Time: 08/08/23  9:58 AM  Result Value Ref Range   Cholesterol, Total 76 (L) 100 - 199 mg/dL   Triglycerides 95 0 - 149 mg/dL   HDL 30 (L) >60 mg/dL   VLDL Cholesterol Cal 19 5 - 40 mg/dL   LDL Chol Calc (NIH) 27 0 - 99 mg/dL  POCT Urinalysis Dipstick (18997)     Status: Abnormal   Collection Time: 10/31/23 11:04 AM  Result Value Ref Range   Color, UA     Clarity, UA Cloudy    Glucose, UA Negative Negative   Bilirubin, UA Negative    Ketones, UA Trace    Spec Grav, UA >=1.030 (A) 1.010 - 1.025   Blood, UA Trace-intact    pH, UA 6.0 5.0 - 8.0   Protein, UA Positive (A) Negative   Urobilinogen, UA 1.0 0.2 or 1.0 E.U./dL   Nitrite, UA Negative    Leukocytes, UA Negative Negative   Appearance     Odor        Assessment & Plan:  Continue taking medications as prescribed. Refills sent. UA check urine. UA was negative for UTI. Start debrox ear drops for 7 days and return for ear flush. Problem List Items Addressed This Visit     NSTEMI  (non-ST elevated myocardial infarction) (HCC)   Relevant Medications   BRILINTA  60 MG TABS tablet   metoprolol  succinate (TOPROL -XL) 100 MG 24 hr tablet   rosuvastatin  (CRESTOR ) 40 MG tablet   Essential hypertension, benign   Relevant Medications   metoprolol  succinate (TOPROL -XL) 100 MG 24 hr tablet   rosuvastatin  (CRESTOR ) 40 MG tablet   Coronary artery disease involving native coronary artery of native heart   Relevant Medications   metoprolol  succinate (TOPROL -XL) 100 MG 24 hr tablet   rosuvastatin  (CRESTOR ) 40 MG tablet   Mixed hyperlipidemia - Primary   Relevant Medications   metoprolol  succinate (TOPROL -XL) 100 MG 24 hr tablet   rosuvastatin  (CRESTOR ) 40 MG tablet   Flank pain   Vertigo   Screening for blood or protein in urine   Relevant Orders   POCT Urinalysis Dipstick (18997) (Completed)    Return in about 1 week (around 11/07/2023) for nurse visit 1 week ear wash.   Total time spent: 30 minutes  FERNAND FREDY RAMAN, MD  10/31/2023   This document may have been prepared by [facility] Voice Recognition software and as such may include unintentional dictation errors.

## 2023-11-01 NOTE — Progress Notes (Signed)
 Pt had appt with Neelam, discussed at visit & follow up scheduled with cards

## 2023-11-07 ENCOUNTER — Ambulatory Visit

## 2023-11-08 DIAGNOSIS — M069 Rheumatoid arthritis, unspecified: Secondary | ICD-10-CM | POA: Diagnosis not present

## 2023-11-10 ENCOUNTER — Ambulatory Visit (INDEPENDENT_AMBULATORY_CARE_PROVIDER_SITE_OTHER)

## 2023-11-10 DIAGNOSIS — I2510 Atherosclerotic heart disease of native coronary artery without angina pectoris: Secondary | ICD-10-CM

## 2023-11-10 DIAGNOSIS — I10 Essential (primary) hypertension: Secondary | ICD-10-CM

## 2023-11-10 DIAGNOSIS — I499 Cardiac arrhythmia, unspecified: Secondary | ICD-10-CM

## 2023-11-10 DIAGNOSIS — I340 Nonrheumatic mitral (valve) insufficiency: Secondary | ICD-10-CM

## 2023-11-10 DIAGNOSIS — I361 Nonrheumatic tricuspid (valve) insufficiency: Secondary | ICD-10-CM

## 2023-11-10 DIAGNOSIS — I351 Nonrheumatic aortic (valve) insufficiency: Secondary | ICD-10-CM | POA: Diagnosis not present

## 2023-11-10 DIAGNOSIS — I371 Nonrheumatic pulmonary valve insufficiency: Secondary | ICD-10-CM | POA: Diagnosis not present

## 2023-11-13 ENCOUNTER — Ambulatory Visit (INDEPENDENT_AMBULATORY_CARE_PROVIDER_SITE_OTHER): Admitting: Cardiovascular Disease

## 2023-11-13 ENCOUNTER — Encounter: Payer: Self-pay | Admitting: Cardiovascular Disease

## 2023-11-13 VITALS — BP 118/74 | HR 84 | Ht 63.0 in | Wt 194.0 lb

## 2023-11-13 DIAGNOSIS — I340 Nonrheumatic mitral (valve) insufficiency: Secondary | ICD-10-CM | POA: Diagnosis not present

## 2023-11-13 DIAGNOSIS — I2510 Atherosclerotic heart disease of native coronary artery without angina pectoris: Secondary | ICD-10-CM

## 2023-11-13 DIAGNOSIS — E782 Mixed hyperlipidemia: Secondary | ICD-10-CM | POA: Diagnosis not present

## 2023-11-13 DIAGNOSIS — I10 Essential (primary) hypertension: Secondary | ICD-10-CM

## 2023-11-13 DIAGNOSIS — Z9861 Coronary angioplasty status: Secondary | ICD-10-CM

## 2023-11-13 DIAGNOSIS — I499 Cardiac arrhythmia, unspecified: Secondary | ICD-10-CM | POA: Diagnosis not present

## 2023-11-13 NOTE — Progress Notes (Signed)
 Cardiology Office Note   Date:  11/13/2023   ID:  Kimberly Burke, Kimberly Burke 1940/07/07, MRN [id number]  PCP:  Fernand Fredy RAMAN, MD  Cardiologist:  Denyse Fernand, MD      History of Present Illness: Kimberly Burke is a 83 y.o. female who presents for  Chief Complaint  Patient presents with   Follow-up    Results    No chest pain ,occasional SOB.      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 Caye at [facility]    CORONARY STENT INTERVENTION N/A 12/04/2018   Procedure: CORONARY STENT INTERVENTION;  Surgeon: Florencio Cara BIRCH, MD;  Location: [facility] INVASIVE CV [facility];  Service: Cardiovascular;  Laterality: N/A;   LEFT HEART CATH AND CORONARY ANGIOGRAPHY N/A 12/04/2018   Procedure: LEFT HEART CATH AND CORONARY ANGIOGRAPHY;  Surgeon: Fernand Denyse LABOR, MD;  Location: [facility] IN[facility];  Service: Cardiovascular;  Laterality: N/A;     Current Outpatient Medications  Medication Sig Dispense Refill   acetaminophen  (TYLENOL ) 650 MG CR tablet Take 1,300 mg by mouth daily.     aspirin  81 MG chewable tablet Chew 1 tablet (81 mg total) by mouth daily.     baclofen  (LIORESAL ) 10 MG tablet Take 1 tablet (10 mg total) by mouth daily. 30 tablet 1   BRILINTA  60 MG TABS tablet Take 1 tablet (60 mg total) by mouth 2 (two) times daily. 60 tablet 3   celecoxib  (CELEBREX ) 200 MG capsule Take 1 capsule (200 mg total) by mouth daily. 30 capsule 3   certolizumab pegol  (CIMZIA ) 2 X 200 MG/ML PSKT prefilled syringe Inject into the skin.     DULoxetine  (CYMBALTA ) 30 MG capsule TAKE 1 CAPSULE BY MOUTH EVERY DAY FOR MOOD 90  capsule 3   fluticasone  (FLONASE ) 50 MCG/ACT nasal spray Place 1 spray into both nostrils daily. 11.1 mL 6   folic acid  (FOLVITE ) 1 MG tablet Take 1 tablet (1 mg total) by mouth daily. 90 tablet 3   gabapentin (NEURONTIN) 100 MG capsule Take 100 mg by mouth 3 (three) times daily.     hydrochlorothiazide  (MICROZIDE ) 12.5 MG capsule Take 1 capsule (12.5 mg total) by mouth daily. 30 capsule 2   hydroxychloroquine (PLAQUENIL) 200 MG tablet Take by mouth.     levothyroxine  (SYNTHROID ) 50 MCG tablet Take 1 tablet (50 mcg total) by mouth daily. 90 tablet 3   losartan  (COZAAR ) 25 MG tablet Take 1 tablet (25 mg total) by mouth daily. 90 tablet 3   meclizine  (ANTIVERT ) 12.5 MG tablet Take 1 tablet (12.5 mg total) by mouth 3 (three) times daily as needed for dizziness. 30 tablet 0   methotrexate (RHEUMATREX) 2.5 MG tablet Take 15 mg by mouth once a week.     metoprolol  succinate (TOPROL -XL) 100 MG 24 hr tablet Take 1 tablet (100 mg total) by mouth daily. 90 tablet 0   rosuvastatin  (CRESTOR ) 40 MG tablet Take 1 tablet (40 mg total) by mouth daily. 90 tablet 3   No current facility-administered medications for this visit.  Allergies:   Codeine and Penicillins    Social History:   reports that she has quit smoking. She has never used smokeless tobacco. She reports that she does not drink alcohol and does not use drugs.   Family History:  family history includes Cancer in her brother.    ROS:     Review of Systems  Constitutional: Negative.   HENT: Negative.    Eyes: Negative.   Respiratory: Negative.    Gastrointestinal: Negative.   Genitourinary: Negative.   Musculoskeletal: Negative.   Skin: Negative.   Neurological: Negative.   Endo/Heme/Allergies: Negative.   Psychiatric/Behavioral: Negative.    All other systems reviewed and are negative.     All other systems are reviewed and negative.    PHYSICAL EXAM: VS:  BP 118/74   Pulse 84   Ht 5' 3 (1.6 m)   Wt 194 lb (88 kg)   SpO2  92%   BMI 34.37 kg/m  , BMI Body mass index is 34.37 kg/m. Last weight:  Wt Readings from Last 3 Encounters:  11/13/23 194 lb (88 kg)  10/31/23 193 lb (87.5 kg)  10/17/23 189 lb 6.4 oz (85.9 kg)     Physical Exam Constitutional:      Appearance: Normal appearance.  Cardiovascular:     Rate and Rhythm: Normal rate and regular rhythm.     Heart sounds: Normal heart sounds.  Pulmonary:     Effort: Pulmonary effort is normal.     Breath sounds: Normal breath sounds.  Musculoskeletal:     Right lower leg: No edema.     Left lower leg: No edema.  Neurological:     Mental Status: She is alert.       EKG:   Recent Labs: 08/08/2023: ALT 21; BUN 16; Creatinine, Ser 0.85; Hemoglobin 11.4; Platelets 212; Potassium 3.8; Sodium 139; TSH 1.770    Lipid Panel    Component Value Date/Time   CHOL 76 (L) 08/08/2023 0958   TRIG 95 08/08/2023 0958   HDL 30 (L) 08/08/2023 0958   LDLCALC 27 08/08/2023 0958      Other studies Reviewed: Additional studies/ records that were reviewed today include:  Review of the above records demonstrates:       No data to display            ASSESSMENT AND PLAN:    ICD-10-CM   1. Essential hypertension, benign  I10 MYOCARDIAL PERFUSION IMAGING    2. Cardiac arrhythmia, unspecified cardiac arrhythmia type  I49.9 MYOCARDIAL PERFUSION IMAGING    3. Mixed hyperlipidemia  E78.2 MYOCARDIAL PERFUSION IMAGING    4. CAD S/P percutaneous coronary angioplasty  I25.10 MYOCARDIAL PERFUSION IMAGING   Z98.61    Had PCI of RCA, 2020, advise f/u , stress test.    5. Nonrheumatic mitral valve regurgitation  I34.0 MYOCARDIAL PERFUSION IMAGING   Mild MR, normal EF, diastolic dysfunction.       Problem List Items Addressed This Visit       Cardiovascular and Mediastinum   Essential hypertension, benign - Primary   Relevant Orders   MYOCARDIAL PERFUSION IMAGING   Cardiac arrhythmia   Relevant Orders   MYOCARDIAL PERFUSION IMAGING     Other    Mixed hyperlipidemia   Relevant Orders   MYOCARDIAL PERFUSION IMAGING   Other Visit Diagnoses       CAD S/P percutaneous coronary angioplasty       Had PCI of RCA, 2020, advise f/u , stress test.   Relevant  Orders   MYOCARDIAL PERFUSION IMAGING     Nonrheumatic mitral valve regurgitation       Mild MR, normal EF, diastolic dysfunction.   Relevant Orders   MYOCARDIAL PERFUSION IMAGING          Disposition:   Return in about 4 weeks (around 12/11/2023) for stress test and f/u.    Total time spent: 35 minutes  Signed,  Denyse Bathe, MD  11/13/2023 10:30 AM    [facility]

## 2023-11-14 ENCOUNTER — Ambulatory Visit

## 2023-11-23 ENCOUNTER — Other Ambulatory Visit: Payer: Self-pay | Admitting: Cardiology

## 2023-11-23 DIAGNOSIS — I10 Essential (primary) hypertension: Secondary | ICD-10-CM

## 2023-12-05 ENCOUNTER — Encounter: Payer: Self-pay | Admitting: Internal Medicine

## 2023-12-05 ENCOUNTER — Ambulatory Visit (INDEPENDENT_AMBULATORY_CARE_PROVIDER_SITE_OTHER): Admitting: Internal Medicine

## 2023-12-05 VITALS — BP 118/80 | HR 79 | Ht 63.0 in | Wt 192.8 lb

## 2023-12-05 DIAGNOSIS — E66811 Obesity, class 1: Secondary | ICD-10-CM | POA: Insufficient documentation

## 2023-12-05 DIAGNOSIS — J301 Allergic rhinitis due to pollen: Secondary | ICD-10-CM

## 2023-12-05 DIAGNOSIS — Z6834 Body mass index (BMI) 34.0-34.9, adult: Secondary | ICD-10-CM

## 2023-12-05 DIAGNOSIS — I10 Essential (primary) hypertension: Secondary | ICD-10-CM | POA: Diagnosis not present

## 2023-12-05 DIAGNOSIS — Z1231 Encounter for screening mammogram for malignant neoplasm of breast: Secondary | ICD-10-CM | POA: Insufficient documentation

## 2023-12-05 DIAGNOSIS — E039 Hypothyroidism, unspecified: Secondary | ICD-10-CM

## 2023-12-05 DIAGNOSIS — E782 Mixed hyperlipidemia: Secondary | ICD-10-CM

## 2023-12-05 DIAGNOSIS — E6609 Other obesity due to excess calories: Secondary | ICD-10-CM

## 2023-12-05 DIAGNOSIS — Z1211 Encounter for screening for malignant neoplasm of colon: Secondary | ICD-10-CM | POA: Insufficient documentation

## 2023-12-05 DIAGNOSIS — M05711 Rheumatoid arthritis with rheumatoid factor of right shoulder without organ or systems involvement: Secondary | ICD-10-CM

## 2023-12-05 DIAGNOSIS — M150 Primary generalized (osteo)arthritis: Secondary | ICD-10-CM

## 2023-12-05 MED ORDER — FLUTICASONE PROPIONATE 50 MCG/ACT NA SUSP: 1.0000 | Freq: Every day | NASAL | 6 refills | Status: AC

## 2023-12-05 MED ORDER — LORATADINE 10 MG PO TABS: 10.0000 mg | ORAL_TABLET | Freq: Every day | ORAL | 2 refills | Status: AC

## 2023-12-05 NOTE — Progress Notes (Signed)
 Established Patient Office Visit  Subjective:  Patient ID: Kimberly Burke, female    DOB: 1940/12/31  Age: 83 y.o. MRN: [id number]  Chief Complaint  Patient presents with   Follow-up    1 month follow up    Patient is here today for follow up. She reports her hearing has slightly improved, reports reduction in tinnitus since having her ears cleaned out. Today she still has some cerumen in her ears. Recommend patient to continue Debrox ear drops 2-3 times a week. Her right TM and ear canal is erythemic and she has serros fluid behind TM. Recommend restarting Flonase  nasal spray daily and adding on Claritin  daily.  Patient has new complaints today. She reports sometimes when she is bending over she gets short of breath but does not endorse dizziness. Her blood pressure looks good today. Denies shortness of breath with exercise. She is due for stress test with cardiology next week; so will determine if that is the cause of her shortness of breath.  She is due for her mammogram.Mammogram due 12/2023 will order today. DEXA is due; Patient reports it is scheduled 12/11/23 Patient has never had colon cancer screening completed. She has gotten cologuard box in the past but was unable to open the sample cup. Will order a new cologuard as this was over a year ago. Patient would prefer cologuard over colonoscopy because of her age.  At her previous appointment she was complaining of flank pain. Her UA was unremarkable. She states her symptoms resolved shortly after that visit and she is not longer having flank pain.  She reports she got her covid vaccine last week. She has already received her flu shot for the season at a prior visit.    No other concerns at this time.   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 Caye at [facility]    CORONARY STENT INTERVENTION N/A 12/04/2018   Procedure: CORONARY STENT INTERVENTION;  Surgeon: Florencio Cara BIRCH, MD;  Location: [facility] INVASIVE CV [facility];  Service: Cardiovascular;  Laterality: N/A;   LEFT HEART CATH AND CORONARY ANGIOGRAPHY N/A 12/04/2018   Procedure: LEFT HEART CATH AND CORONARY ANGIOGRAPHY;  Surgeon: Fernand Denyse LABOR, MD;  Location: [facility] INVASIVE CV [facility];  Service: Cardiovascular;  Laterality: N/A;    Social History   Socioeconomic History   Marital status: Widowed    Spouse name: Not on file   Number of children: Not on file   Years of education: Not on file   Highest education level: Not on file  Occupational History   Not on file  Tobacco Use   Smoking status: Former   Smokeless tobacco: Never  Vaping Use   Vaping status: Never Used  Substance and Sexual Activity   Alcohol use: Never   Drug use: Never   Sexual activity: Not Currently  Other Topics Concern   Not on file  Social History Narrative   Lives with daughter   Social Drivers of Health   Financial Resource Strain: Low Risk  (09/02/2021)   Overall Financial Resource Strain (CARDIA)  Difficulty of Paying Living Expenses: Not hard at all  Food Insecurity: No Food Insecurity (09/09/2021)   Hunger Vital Sign    Worried About Running Out of Food in the Last Year: Never true    Ran Out of Food in the Last Year: Never true  Transportation Needs: No Transportation Needs (09/09/2021)   PRAPARE - Administrator, Civil Service (Medical): No    Lack of Transportation (Non-Medical): No  Physical Activity: Insufficiently Active (09/02/2021)   Exercise Vital Sign    Days of Exercise per Week: 5 days    Minutes of Exercise per Session: 10 min  Stress: No Stress Concern  Present (09/02/2021)   [employer] of Occupational Health - Occupational Stress Questionnaire    Feeling of Stress : Not at all  Social Connections: Moderately Integrated (09/02/2021)   Social Connection and Isolation Panel    Frequency of Communication with Friends and Family: More than three times a week    Frequency of Social Gatherings with Friends and Family: More than three times a week    Attends Religious Services: More than 4 times per year    Active Member of [employer] or Organizations: Yes    Attends Banker Meetings: More than 4 times per year    Marital Status: Widowed  Catering Manager Violence: Not on file    Family History  Problem Relation Age of Onset   Cancer Brother        prostate or colon unsure which    Breast cancer Neg Hx     Allergies  Allergen Reactions   Codeine Itching   Penicillins Other (See Comments)    unknown    Outpatient Medications Prior to Visit  Medication Sig   acetaminophen  (TYLENOL ) 650 MG CR tablet Take 1,300 mg by mouth daily.   aspirin  81 MG chewable tablet Chew 1 tablet (81 mg total) by mouth daily.   baclofen  (LIORESAL ) 10 MG tablet Take 1 tablet (10 mg total) by mouth daily.   BRILINTA  60 MG TABS tablet Take 1 tablet (60 mg total) by mouth 2 (two) times daily.   celecoxib  (CELEBREX ) 200 MG capsule Take 1 capsule (200 mg total) by mouth daily.   certolizumab pegol  (CIMZIA ) 2 X 200 MG/ML PSKT prefilled syringe Inject into the skin.   DULoxetine  (CYMBALTA ) 30 MG capsule TAKE 1 CAPSULE BY MOUTH EVERY DAY FOR MOOD   folic acid  (FOLVITE ) 1 MG tablet Take 1 tablet (1 mg total) by mouth daily.   gabapentin (NEURONTIN) 100 MG capsule Take 100 mg by mouth 3 (three) times daily.   hydrochlorothiazide  (MICROZIDE ) 12.5 MG capsule Take 1 capsule (12.5 mg total) by mouth daily.   hydroxychloroquine (PLAQUENIL) 200 MG tablet Take by mouth.   levothyroxine  (SYNTHROID ) 50 MCG tablet Take 1 tablet (50 mcg total) by mouth daily.    losartan  (COZAAR ) 25 MG tablet TAKE 1 TABLET(25 MG) BY MOUTH DAILY   meclizine  (ANTIVERT ) 12.5 MG tablet Take 1 tablet (12.5 mg total) by mouth 3 (three) times daily as needed for dizziness.   methotrexate (RHEUMATREX) 2.5 MG tablet Take 15 mg by mouth once a week.   metoprolol  succinate (TOPROL -XL) 100 MG 24 hr tablet Take 1 tablet (100 mg total) by mouth daily.   rosuvastatin  (CRESTOR ) 40 MG tablet Take 1 tablet (40 mg total) by mouth daily.   [DISCONTINUED] fluticasone  (FLONASE ) 50 MCG/ACT nasal spray Place 1 spray into both nostrils daily.   No facility-administered medications prior to visit.  Review of Systems  Constitutional: Negative.  Negative for chills, fever and malaise/fatigue.  HENT: Negative.  Negative for congestion and sore throat.   Eyes: Negative.  Negative for blurred vision and pain.  Respiratory:  Positive for shortness of breath (occasionally with bending over.). Negative for cough.   Cardiovascular: Negative.  Negative for chest pain, palpitations and leg swelling.  Gastrointestinal: Negative.  Negative for abdominal pain, blood in stool, constipation, diarrhea, heartburn, melena, nausea and vomiting.  Genitourinary: Negative.  Negative for dysuria, flank pain, frequency and urgency.  Musculoskeletal: Negative.  Negative for joint pain and myalgias.  Skin: Negative.   Neurological: Negative.  Negative for dizziness, tingling, sensory change, weakness and headaches.  Endo/Heme/Allergies: Negative.   Psychiatric/Behavioral: Negative.  Negative for depression and suicidal ideas. The patient is not nervous/anxious.        Objective:   BP 118/80   Pulse 79   Ht 5' 3 (1.6 m)   Wt 192 lb 12.8 oz (87.5 kg)   SpO2 99%   BMI 34.15 kg/m   Vitals:   12/05/23 0951  BP: 118/80  Pulse: 79  Height: 5' 3 (1.6 m)  Weight: 192 lb 12.8 oz (87.5 kg)  SpO2: 99%  BMI (Calculated): 34.16    Physical Exam Vitals and nursing note reviewed.  Constitutional:       Appearance: Normal appearance.  HENT:     Head: Normocephalic and atraumatic.     Right Ear: No tenderness. A middle ear effusion is present. No mastoid tenderness. Tympanic membrane is erythematous.     Left Ear: No tenderness. A middle ear effusion is present. No mastoid tenderness.     Nose: Nose normal.     Mouth/Throat:     Mouth: Mucous membranes are moist.     Pharynx: Oropharynx is clear.  Eyes:     Conjunctiva/sclera: Conjunctivae normal.     Pupils: Pupils are equal, round, and reactive to light.  Cardiovascular:     Rate and Rhythm: Normal rate and regular rhythm.     Pulses: Normal pulses.     Heart sounds: Normal heart sounds. No murmur heard. Pulmonary:     Effort: Pulmonary effort is normal.     Breath sounds: Normal breath sounds. No wheezing.  Abdominal:     General: Bowel sounds are normal.     Palpations: Abdomen is soft.     Tenderness: There is no abdominal tenderness. There is no right CVA tenderness or left CVA tenderness.  Musculoskeletal:        General: Normal range of motion.     Cervical back: Normal range of motion.     Right lower leg: No edema.     Left lower leg: No edema.  Skin:    General: Skin is warm and dry.  Neurological:     General: No focal deficit present.     Mental Status: She is alert and oriented to person, place, and time.  Psychiatric:        Mood and Affect: Mood normal.        Behavior: Behavior normal.      No results found for any visits on 12/05/23.  Recent Results (from the past 2160 hours)  POCT Urinalysis Dipstick (18997)     Status: Abnormal   Collection Time: 10/31/23 11:04 AM  Result Value Ref Range   Color, UA     Clarity, UA Cloudy    Glucose, UA Negative Negative   Bilirubin, UA Negative  Ketones, UA Trace    Spec Grav, UA >=1.030 (A) 1.010 - 1.025   Blood, UA Trace-intact    pH, UA 6.0 5.0 - 8.0   Protein, UA Positive (A) Negative   Urobilinogen, UA 1.0 0.2 or 1.0 E.U./dL   Nitrite, UA Negative     Leukocytes, UA Negative Negative   Appearance     Odor        Assessment & Plan:  Start Claritin  daily. Refill sent for Flonase  nasal spray daily. Check routine blood work today. Cologuard ordered. Mammogram ordered. Problem List Items Addressed This Visit     Hypothyroid   Relevant Orders   TSH+T4F+T3Free   Essential hypertension, benign - Primary   Relevant Orders   CMP14+EGFR   Rheumatoid arthritis (HCC)   Relevant Orders   CBC with Diff   Degenerative arthritis   Seasonal allergic rhinitis due to pollen   Relevant Medications   fluticasone  (FLONASE ) 50 MCG/ACT nasal spray   loratadine  (CLARITIN ) 10 MG tablet   Mixed hyperlipidemia   Relevant Orders   Lipid Panel w/o Chol/HDL Ratio   Colon cancer screening   Relevant Orders   Cologuard   Breast cancer screening by mammogram   Relevant Orders   MM 3D SCREENING MAMMOGRAM BILATERAL BREAST   Class 1 obesity due to excess calories without serious comorbidity with body mass index (BMI) of 34.0 to 34.9 in adult    Return in about 5 weeks (around 01/12/2024) for awv.   Total time spent: 25 minutes. This time includes review of previous notes and results and patient face to face interaction during today's visit.    FERNAND FREDY RAMAN, MD  12/05/2023   This document may have been prepared by [facility] Voice Recognition software and as such may include unintentional dictation errors.

## 2023-12-06 LAB — CMP14+EGFR
ALT: 17 IU/L (ref 0–32)
AST: 17 IU/L (ref 0–40)
Albumin: 4.2 g/dL (ref 3.7–4.7)
Alkaline Phosphatase: 89 IU/L (ref 48–129)
BUN/Creatinine Ratio: 17 (ref 12–28)
BUN: 13 mg/dL (ref 8–27)
Bilirubin Total: 0.5 mg/dL (ref 0.0–1.2)
CO2: 27 mmol/L (ref 20–29)
Calcium: 10.8 mg/dL — ABNORMAL HIGH (ref 8.7–10.3)
Chloride: 104 mmol/L (ref 96–106)
Creatinine, Ser: 0.78 mg/dL (ref 0.57–1.00)
Globulin, Total: 3 g/dL (ref 1.5–4.5)
Glucose: 89 mg/dL (ref 70–99)
Potassium: 4.3 mmol/L (ref 3.5–5.2)
Sodium: 143 mmol/L (ref 134–144)
Total Protein: 7.2 g/dL (ref 6.0–8.5)
eGFR: 76 mL/min/1.73 (ref >59)

## 2023-12-06 LAB — CBC WITH DIFFERENTIAL/PLATELET
Basophils Absolute: 0.1 x10E3/uL (ref 0.0–0.2)
Basos: 1 %
EOS (ABSOLUTE): 0.3 x10E3/uL (ref 0.0–0.4)
Eos: 6 %
Hematocrit: 35.9 % (ref 34.0–46.6)
Hemoglobin: 11.7 g/dL (ref 11.1–15.9)
Immature Grans (Abs): 0 x10E3/uL (ref 0.0–0.1)
Immature Granulocytes: 0 %
Lymphocytes Absolute: 0.7 x10E3/uL (ref 0.7–3.1)
Lymphs: 14 %
MCH: 33 pg (ref 26.6–33.0)
MCHC: 32.6 g/dL (ref 31.5–35.7)
MCV: 101 fL — ABNORMAL HIGH (ref 79–97)
Monocytes Absolute: 0.5 x10E3/uL (ref 0.1–0.9)
Monocytes: 10 %
Neutrophils Absolute: 3.5 x10E3/uL (ref 1.4–7.0)
Neutrophils: 69 %
Platelets: 200 x10E3/uL (ref 150–450)
RBC: 3.55 x10E6/uL — ABNORMAL LOW (ref 3.77–5.28)
RDW: 13.4 % (ref 11.7–15.4)
WBC: 5 x10E3/uL (ref 3.4–10.8)

## 2023-12-06 LAB — TSH+T4F+T3FREE
Free T4: 1.21 ng/dL (ref 0.82–1.77)
T3, Free: 3 pg/mL (ref 2.0–4.4)
TSH: 1.97 u[IU]/mL (ref 0.450–4.500)

## 2023-12-06 LAB — LIPID PANEL W/O CHOL/HDL RATIO
Cholesterol, Total: 86 mg/dL — ABNORMAL LOW (ref 100–199)
HDL: 35 mg/dL — ABNORMAL LOW (ref >39)
LDL Chol Calc (NIH): 33 mg/dL (ref 0–99)
Triglycerides: 93 mg/dL (ref 0–149)
VLDL Cholesterol Cal: 18 mg/dL (ref 5–40)

## 2023-12-07 ENCOUNTER — Ambulatory Visit: Payer: Self-pay | Admitting: Internal Medicine

## 2023-12-07 NOTE — Progress Notes (Signed)
 Patient notified

## 2023-12-11 ENCOUNTER — Ambulatory Visit (INDEPENDENT_AMBULATORY_CARE_PROVIDER_SITE_OTHER)

## 2023-12-11 DIAGNOSIS — I10 Essential (primary) hypertension: Secondary | ICD-10-CM

## 2023-12-11 DIAGNOSIS — Z9861 Coronary angioplasty status: Secondary | ICD-10-CM | POA: Diagnosis not present

## 2023-12-11 DIAGNOSIS — I2510 Atherosclerotic heart disease of native coronary artery without angina pectoris: Secondary | ICD-10-CM | POA: Diagnosis not present

## 2023-12-11 DIAGNOSIS — I340 Nonrheumatic mitral (valve) insufficiency: Secondary | ICD-10-CM

## 2023-12-11 DIAGNOSIS — I499 Cardiac arrhythmia, unspecified: Secondary | ICD-10-CM | POA: Diagnosis not present

## 2023-12-11 DIAGNOSIS — E782 Mixed hyperlipidemia: Secondary | ICD-10-CM

## 2023-12-18 ENCOUNTER — Ambulatory Visit: Admitting: Cardiovascular Disease

## 2023-12-18 ENCOUNTER — Encounter: Payer: Self-pay | Admitting: Cardiovascular Disease

## 2023-12-18 VITALS — BP 112/74 | HR 67 | Ht 63.0 in | Wt 191.2 lb

## 2023-12-18 DIAGNOSIS — I10 Essential (primary) hypertension: Secondary | ICD-10-CM | POA: Diagnosis not present

## 2023-12-18 DIAGNOSIS — I214 Non-ST elevation (NSTEMI) myocardial infarction: Secondary | ICD-10-CM

## 2023-12-18 DIAGNOSIS — I340 Nonrheumatic mitral (valve) insufficiency: Secondary | ICD-10-CM | POA: Diagnosis not present

## 2023-12-18 DIAGNOSIS — I2510 Atherosclerotic heart disease of native coronary artery without angina pectoris: Secondary | ICD-10-CM

## 2023-12-18 DIAGNOSIS — Z9861 Coronary angioplasty status: Secondary | ICD-10-CM

## 2023-12-18 NOTE — Progress Notes (Signed)
 Cardiology Office Note   Date:  12/18/2023   ID:  KRISTALYN BERGSTRESSER, DOB January 10, 1941, MRN [id number]  PCP:  Fernand Fredy RAMAN, MD  Cardiologist:  Denyse Fernand, MD      History of Present Illness: Kimberly Burke is a 83 y.o. female who presents for  Chief Complaint  Patient presents with   Follow-up    Stress follow up    No chest pain or SOB.      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 Caye at [facility]    CORONARY STENT INTERVENTION N/A 12/04/2018   Procedure: CORONARY STENT INTERVENTION;  Surgeon: Florencio Cara BIRCH, MD;  Location: [facility] INV[facility] CV [facility];  Service: Cardiovascular;  Laterality: N/A;   LEFT HEART CATH AND CORONARY ANGIOGRAPHY N/A 12/04/2018   Procedure: LEFT HEART CATH AND CORONARY ANGIOGRAPHY;  Surgeon: Fernand Denyse LABOR, MD;  Location: [facility] CV [facility];  Service: Cardiovascular;  Laterality: N/A;     Current Outpatient Medications  Medication Sig Dispense Refill   acetaminophen  (TYLENOL ) 650 MG CR tablet Take 1,300 mg by mouth daily.     aspirin  81 MG chewable tablet Chew 1 tablet (81 mg total) by mouth daily.     baclofen  (LIORESAL ) 10 MG tablet Take 1 tablet (10 mg total) by mouth daily. 30 tablet 1   BRILINTA  60 MG TABS tablet Take 1 tablet (60 mg total) by mouth 2 (two) times daily. 60 tablet 3   celecoxib  (CELEBREX ) 200 MG capsule Take 1 capsule (200 mg total) by mouth daily. 30 capsule 3   certolizumab pegol  (CIMZIA ) 2 X 200 MG/ML PSKT prefilled syringe Inject into the skin.     DULoxetine  (CYMBALTA ) 30 MG capsule TAKE 1 CAPSULE BY MOUTH EVERY DAY FOR MOOD 90  capsule 3   fluticasone  (FLONASE ) 50 MCG/ACT nasal spray Place 1 spray into both nostrils daily. 11.1 mL 6   folic acid  (FOLVITE ) 1 MG tablet Take 1 tablet (1 mg total) by mouth daily. 90 tablet 3   gabapentin (NEURONTIN) 100 MG capsule Take 100 mg by mouth 3 (three) times daily.     hydrochlorothiazide  (MICROZIDE ) 12.5 MG capsule Take 1 capsule (12.5 mg total) by mouth daily. 30 capsule 2   hydroxychloroquine (PLAQUENIL) 200 MG tablet Take by mouth.     levothyroxine  (SYNTHROID ) 50 MCG tablet Take 1 tablet (50 mcg total) by mouth daily. 90 tablet 3   loratadine  (CLARITIN ) 10 MG tablet Take 1 tablet (10 mg total) by mouth daily. 30 tablet 2   losartan  (COZAAR ) 25 MG tablet TAKE 1 TABLET(25 MG) BY MOUTH DAILY 90 tablet 3   meclizine  (ANTIVERT ) 12.5 MG tablet Take 1 tablet (12.5 mg total) by mouth 3 (three) times daily as needed for dizziness. 30 tablet 0   methotrexate (RHEUMATREX) 2.5 MG tablet Take 15 mg by mouth once a week.     metoprolol  succinate (TOPROL -XL) 100 MG 24 hr tablet Take 1 tablet (100 mg total) by mouth daily. 90 tablet 0   rosuvastatin  (CRESTOR ) 40 MG tablet Take 1 tablet (40 mg  total) by mouth daily. 90 tablet 3   No current facility-administered medications for this visit.    Allergies:   Codeine and Penicillins    Social History:   reports that she has quit smoking. She has never used smokeless tobacco. She reports that she does not drink alcohol and does not use drugs.   Family History:  family history includes Cancer in her brother.    ROS:     Review of Systems  Constitutional: Negative.   HENT: Negative.    Eyes: Negative.   Respiratory: Negative.    Gastrointestinal: Negative.   Genitourinary: Negative.   Musculoskeletal: Negative.   Skin: Negative.   Neurological: Negative.   Endo/Heme/Allergies: Negative.   Psychiatric/Behavioral: Negative.    All other systems reviewed and are negative.     All other systems are reviewed and negative.     PHYSICAL EXAM: VS:  BP 112/74   Pulse 67   Ht 5' 3 (1.6 m)   Wt 191 lb 3.2 oz (86.7 kg)   SpO2 95%   BMI 33.87 kg/m  , BMI Body mass index is 33.87 kg/m. Last weight:  Wt Readings from Last 3 Encounters:  12/18/23 191 lb 3.2 oz (86.7 kg)  12/05/23 192 lb 12.8 oz (87.5 kg)  11/13/23 194 lb (88 kg)     Physical Exam Constitutional:      Appearance: Normal appearance.  Cardiovascular:     Rate and Rhythm: Normal rate and regular rhythm.     Heart sounds: Normal heart sounds.  Pulmonary:     Effort: Pulmonary effort is normal.     Breath sounds: Normal breath sounds.  Musculoskeletal:     Right lower leg: No edema.     Left lower leg: No edema.  Neurological:     Mental Status: She is alert.       EKG:   Recent Labs: 12/05/2023: ALT 17; BUN 13; Creatinine, Ser 0.78; Hemoglobin 11.7; Platelets 200; Potassium 4.3; Sodium 143; TSH 1.970    Lipid Panel    Component Value Date/Time   CHOL 86 (L) 12/05/2023 1044   TRIG 93 12/05/2023 1044   HDL 35 (L) 12/05/2023 1044   LDLCALC 33 12/05/2023 1044      Other studies Reviewed: Additional studies/ records that were reviewed today include:  Review of the above records demonstrates:       No data to display            ASSESSMENT AND PLAN:    ICD-10-CM   1. NSTEMI (non-ST elevated myocardial infarction) (HCC)  I21.4     2. Coronary artery disease involving native coronary artery of native heart without angina pectoris  I25.10     3. Nonrheumatic mitral valve regurgitation  I34.0     4. CAD S/P percutaneous coronary angioplasty  I25.10    Z98.61    No furtehe chest pain. stress test normal. RCA prox, mid 70%, PCI treated, and doing fine    5. Essential hypertension, benign  I10        Problem List Items Addressed This Visit       Cardiovascular and Mediastinum   NSTEMI (non-ST elevated myocardial infarction) (HCC) - Primary   Essential hypertension, benign   Coronary artery disease involving  native coronary artery of native heart   Other Visit Diagnoses       Nonrheumatic mitral valve regurgitation         CAD S/P percutaneous coronary angioplasty  No furtehe chest pain. stress test normal. RCA prox, mid 70%, PCI treated, and doing fine          Disposition:   Return in about 3 months (around 03/19/2024).    Total time spent: 35 minutes  Signed,  Denyse Bathe, MD  12/18/2023 10:37 AM    [facility]

## 2024-01-09 ENCOUNTER — Ambulatory Visit: Admitting: Internal Medicine

## 2024-01-11 ENCOUNTER — Ambulatory Visit
Admission: RE | Admit: 2024-01-11 | Discharge: 2024-01-11 | Disposition: A | Discharge status: Home / Self Care | Source: Ambulatory Visit | Attending: Radiation Oncology | Admitting: Radiation Oncology

## 2024-01-11 VITALS — BP 143/73 | HR 69 | Temp 98.0°F | Resp 16 | Ht 63.0 in | Wt 192.2 lb

## 2024-01-11 DIAGNOSIS — Z8542 Personal history of malignant neoplasm of other parts of uterus: Secondary | ICD-10-CM | POA: Diagnosis present

## 2024-01-11 DIAGNOSIS — Z90710 Acquired absence of both cervix and uterus: Secondary | ICD-10-CM | POA: Insufficient documentation

## 2024-01-11 DIAGNOSIS — Z90722 Acquired absence of ovaries, bilateral: Secondary | ICD-10-CM | POA: Diagnosis not present

## 2024-01-11 DIAGNOSIS — Z923 Personal history of irradiation: Secondary | ICD-10-CM | POA: Diagnosis not present

## 2024-01-11 DIAGNOSIS — C541 Malignant neoplasm of endometrium: Secondary | ICD-10-CM

## 2024-01-11 NOTE — Progress Notes (Signed)
 Radiation Oncology Follow up Note  Name: Kimberly Burke  Date:   01/11/2024 MRN:  [id number] DOB: [birth year]    This 83 y.o. female presents to the clinic today for 72-month follow-up status post IMRT radiation therapy to her pelvis as well as vaginal brachytherapy for recurrent high-grade endometrial carcinoma status post TAL H BSO in 22 and then presented with a vaginal recurrence.  REFERRING PROVIDER: Fernand Fredy RAMAN, MD  HPI: Patient is a an 83 year old female now out 5 months having completed both thermal beam IMRT radiation therapy to her pelvis as well as vaginal brachytherapy for a recurrence of high-grade endometrial carcinoma status post TLH BSO in 22.  Seen today in routine follow-up she is doing well.  She specifically denies any pelvic pain any increased lower urinary tract symptoms diarrhea vaginal discharge..  She had a pelvic exam by Dr. Mancil back in September and is NED.  She had a PET scan back in April which showed no areas of abnormal radiotracer uptake to suggest recurrent disease or progressive disease.  COMPLICATIONS OF TREATMENT: none  FOLLOW UP COMPLIANCE: keeps appointments   PHYSICAL EXAM:  BP (!) 143/73   Pulse 69   Temp 98 F (36.7 C) (Tympanic)   Resp 16   Ht 5' 3 (1.6 m)   Wt 192 lb 3.2 oz (87.2 kg)   BMI 34.05 kg/m  Well-developed well-nourished patient in NAD. HEENT reveals PERLA, EOMI, discs not visualized.  Oral cavity is clear. No oral mucosal lesions are identified. Neck is clear without evidence of cervical or supraclavicular adenopathy. Lungs are clear to A&P. Cardiac examination is essentially unremarkable with regular rate and rhythm without murmur rub or thrill. Abdomen is benign with no organomegaly or masses noted. Motor sensory and DTR levels are equal and symmetric in the upper and lower extremities. Cranial nerves II through XII are grossly intact. Proprioception is intact. No peripheral adenopathy or edema is identified. No motor or  sensory levels are noted. Crude visual fields are within normal range.  RADIOLOGY RESULTS: PET scan reviewed compatible with above-stated findings  PLAN: At the present time patient continues close follow-up care with GYN oncology.  I will turn follow-up care over to them.  I would be happy to reevaluate the patient anytime should that be indicated.  I asked the patient to forward me any copies of future imaging studies she has performed.  I would like to take this opportunity to thank you for allowing me to participate in the care of your patient.SABRA Marcey Penton, MD

## 2024-01-16 ENCOUNTER — Other Ambulatory Visit: Payer: Self-pay | Admitting: Internal Medicine

## 2024-01-16 DIAGNOSIS — I10 Essential (primary) hypertension: Secondary | ICD-10-CM

## 2024-02-28 ENCOUNTER — Ambulatory Visit: Admitting: Cardiology

## 2024-02-28 ENCOUNTER — Other Ambulatory Visit: Payer: Self-pay | Admitting: Internal Medicine

## 2024-02-28 ENCOUNTER — Encounter: Payer: Self-pay | Admitting: Cardiology

## 2024-02-28 VITALS — BP 108/76 | HR 84 | Ht 63.0 in | Wt 188.4 lb

## 2024-02-28 DIAGNOSIS — I214 Non-ST elevation (NSTEMI) myocardial infarction: Secondary | ICD-10-CM

## 2024-02-28 DIAGNOSIS — E782 Mixed hyperlipidemia: Secondary | ICD-10-CM | POA: Diagnosis not present

## 2024-02-28 DIAGNOSIS — E6609 Other obesity due to excess calories: Secondary | ICD-10-CM

## 2024-02-28 DIAGNOSIS — N182 Chronic kidney disease, stage 2 (mild): Secondary | ICD-10-CM | POA: Diagnosis not present

## 2024-02-28 DIAGNOSIS — I10 Essential (primary) hypertension: Secondary | ICD-10-CM | POA: Diagnosis not present

## 2024-02-28 DIAGNOSIS — Z6834 Body mass index (BMI) 34.0-34.9, adult: Secondary | ICD-10-CM

## 2024-02-28 DIAGNOSIS — E66811 Obesity, class 1: Secondary | ICD-10-CM | POA: Diagnosis not present

## 2024-02-28 DIAGNOSIS — J0140 Acute pansinusitis, unspecified: Secondary | ICD-10-CM | POA: Diagnosis not present

## 2024-02-28 MED ORDER — DOXYCYCLINE HYCLATE 100 MG PO TABS: 100.0000 mg | ORAL_TABLET | Freq: Two times a day (BID) | ORAL | 0 refills | Status: AC

## 2024-02-28 NOTE — Progress Notes (Signed)
 "  Established Patient Office Visit  Subjective:  Patient ID: Kimberly Burke, female    DOB: May 03, 1940  Age: 84 y.o. MRN: [id number]  Chief Complaint  Patient presents with   Acute Visit    Headache started 3 days ago, bp elevated at infusion center this morning. She took her bp medication about 30 minutes ago the infusion center suggested she come to her pcp.    Patient in office for an acute visit, complaining of headaches, elevated blood pressure. Started 3 days ago. Patient went for infusion this morning for her rheumatoid arthritis, blood pressure was elevated, recommended she follow up with PCP. Patient admits to not taking her blood pressure medication this morning until she came to this office. Patient also reports eating salted peanuts and bacon frequently, recommend decreasing salt intake. Repeat blood pressure after resting for 20 minutes, with large cuff, blood pressure improved.  Patient complains of headaches for the past 3 days, taking Tylenol  if head hurts bad enough, relieves headache. Patient reports waking in the morning with nasal congestion, yellow drainage. Using Flonase  more than once daily. Recommend using it only once daily. Will send in Doxycyline. Use Tylenol  as needed for headache.   Headache  This is a new problem. The current episode started in the past 7 days. The problem occurs constantly. The problem has been unchanged. The pain is located in the Frontal and temporal region. The pain does not radiate. The pain quality is not similar to prior headaches. The quality of the pain is described as dull and aching. The pain is mild. Associated symptoms include eye watering. Pertinent negatives include no abdominal pain, blurred vision, coughing, dizziness, ear pain, fever, numbness, phonophobia, photophobia, rhinorrhea, sore throat or vomiting. Nothing aggravates the symptoms. She has tried acetaminophen  for the symptoms. The treatment provided no relief. Her past medical  history is significant for hypertension, immunosuppression and obesity. There is no history of cluster headaches or recent head traumas.    No other concerns at this time.   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 Caye at [facility]    CORONARY STENT INTERVENTION N/A 12/04/2018   Procedure: CORONARY STENT INTERVENTION;  Surgeon: Florencio Cara BIRCH, MD;  Location: [facility] INVASIVE CV [facility];  Service: Cardiovascular;  Laterality: N/A;   LEFT HEART CATH AND CORONARY ANGIOGRAPHY N/A 12/04/2018   Procedure: LEFT HEART CATH AND CORONARY ANGIOGRAPHY;  Surgeon: Fernand Denyse LABOR, MD;  Location: [facility] INVASIVE CV [facility];  Service: Cardiovascular;  Laterality: N/A;    Social History   Socioeconomic History   Marital status: Widowed    Spouse name: Not on file   Number of children: Not on file   Years of education: Not on file   Highest education level: Not on file  Occupational History   Not on file  Tobacco Use   Smoking status: Former   Smokeless tobacco: Never  Vaping Use   Vaping status: Never Used  Substance and Sexual Activity   Alcohol use: Never   Drug use: Never   Sexual activity: Not Currently  Other  Topics Concern   Not on file  Social History Narrative   Lives with daughter   Social Drivers of Health   Tobacco Use: Medium Risk (02/28/2024)   Patient History    Smoking Tobacco Use: Former    Smokeless Tobacco Use: Never    Passive Exposure: Not on file  Financial Resource Strain: Low Risk (09/02/2021)   Overall Financial Resource Strain (CARDIA)    Difficulty of Paying Living Expenses: Not hard  at all  Food Insecurity: No Food Insecurity (09/09/2021)   Hunger Vital Sign    Worried About Running Out of Food in the Last Year: Never true    Ran Out of Food in the Last Year: Never true  Transportation Needs: No Transportation Needs (09/09/2021)   PRAPARE - Administrator, Civil Service (Medical): No    Lack of Transportation (Non-Medical): No  Physical Activity: Insufficiently Active (09/02/2021)   Exercise Vital Sign    Days of Exercise per Week: 5 days    Minutes of Exercise per Session: 10 min  Stress: No Stress Concern Present (09/02/2021)   [employer] of Occupational Health - Occupational Stress Questionnaire    Feeling of Stress : Not at all  Social Connections: Moderately Integrated (09/02/2021)   Social Connection and Isolation Panel    Frequency of Communication with Friends and Family: More than three times a week    Frequency of Social Gatherings with Friends and Family: More than three times a week    Attends Religious Services: More than 4 times per year    Active Member of [employer] or Organizations: Yes    Attends Banker Meetings: More than 4 times per year    Marital Status: Widowed  Intimate Partner Violence: Not on file  Depression (PHQ2-9): Low Risk (01/11/2024)   Depression (PHQ2-9)    PHQ-2 Score: 0  Alcohol Screen: Not on file  Housing: Unknown (02/16/2023)   Received from [facility]   Epic    Unable to Pay for Housing in the Last Year: Not on file    Number of Times Moved in the Last Year: Not on file    At any time in the past 12 months, were you homeless or living in a shelter (including now)?: No  Utilities: Not At Risk (01/12/2023)   [facility] Utilities    Threatened with loss of utilities: No  Health Literacy: Adequate Health Literacy (01/12/2023)   B1300 Health Literacy    Frequency of need for help with medical instructions: Never    Family History  Problem Relation Age of Onset   Cancer Brother         prostate or colon unsure which    Breast cancer Neg Hx     Allergies[1]  Show/hide medication list[2]  Review of Systems  Constitutional: Negative.  Negative for fever.  HENT:  Positive for congestion and sinus pain. Negative for ear pain, rhinorrhea and sore throat.   Eyes:  Negative for blurred vision and photophobia.  Respiratory: Negative.  Negative for cough and shortness of breath.   Cardiovascular: Negative.  Negative for chest pain.  Gastrointestinal: Negative.  Negative for abdominal pain, constipation, diarrhea and vomiting.  Genitourinary: Negative.   Musculoskeletal:  Negative for joint pain and myalgias.  Skin: Negative.   Neurological:  Positive for headaches. Negative for dizziness and numbness.  Endo/Heme/Allergies: Negative.   All other systems reviewed and are negative.      Objective:   BP 108/76 (BP  Location: Left Arm, Patient Position: Sitting, Cuff Size: Large)   Pulse 84   Ht 5' 3 (1.6 m)   Wt 188 lb 6.4 oz (85.5 kg)   SpO2 95%   BMI 33.37 kg/m   Vitals:   02/28/24 1038 02/28/24 1058  BP: (!) 168/92 108/76  Pulse: 84   Height: 5' 3 (1.6 m)   Weight: 188 lb 6.4 oz (85.5 kg)   SpO2: 95%   BMI (Calculated): 33.38     Physical Exam Vitals and nursing note reviewed.  Constitutional:      Appearance: Normal appearance. She is normal weight.  HENT:     Head: Normocephalic and atraumatic.      Comments: headache    Nose: Nose normal.     Mouth/Throat:     Mouth: Mucous membranes are moist.  Eyes:     Extraocular Movements: Extraocular movements intact.     Conjunctiva/sclera: Conjunctivae normal.     Pupils: Pupils are equal, round, and reactive to light.  Cardiovascular:     Rate and Rhythm: Normal rate and regular rhythm.     Pulses: Normal pulses.     Heart sounds: Normal heart sounds.  Pulmonary:     Effort: Pulmonary effort is normal.     Breath sounds: Normal breath sounds.  Abdominal:     General: Abdomen is flat. Bowel  sounds are normal.     Palpations: Abdomen is soft.  Musculoskeletal:        General: Normal range of motion.     Cervical back: Normal range of motion.  Skin:    General: Skin is warm and dry.  Neurological:     General: No focal deficit present.     Mental Status: She is alert and oriented to person, place, and time.  Psychiatric:        Mood and Affect: Mood normal.        Behavior: Behavior normal.        Thought Content: Thought content normal.        Judgment: Judgment normal.      No results found for any visits on 02/28/24.  Recent Results (from the past 2160 hours)  CMP14+EGFR     Status: Abnormal   Collection Time: 12/05/23 10:44 AM  Result Value Ref Range   Glucose 89 70 - 99 mg/dL   BUN 13 8 - 27 mg/dL   Creatinine, Ser 9.21 0.57 - 1.00 mg/dL   eGFR 76 >40 fO/fpw/8.26   BUN/Creatinine Ratio 17 12 - 28   Sodium 143 134 - 144 mmol/L   Potassium 4.3 3.5 - 5.2 mmol/L   Chloride 104 96 - 106 mmol/L   CO2 27 20 - 29 mmol/L   Calcium  10.8 (H) 8.7 - 10.3 mg/dL   Total Protein 7.2 6.0 - 8.5 g/dL   Albumin 4.2 3.7 - 4.7 g/dL   Globulin, Total 3.0 1.5 - 4.5 g/dL   Bilirubin Total 0.5 0.0 - 1.2 mg/dL   Alkaline Phosphatase 89 48 - 129 IU/L   AST 17 0 - 40 IU/L   ALT 17 0 - 32 IU/L  Lipid Panel w/o Chol/HDL Ratio     Status: Abnormal   Collection Time: 12/05/23 10:44 AM  Result Value Ref Range   Cholesterol, Total 86 (L) 100 - 199 mg/dL   Triglycerides 93 0 - 149 mg/dL   HDL 35 (L) >60 mg/dL   VLDL Cholesterol Cal 18 5 - 40 mg/dL   LDL Chol Calc (NIH)  33 0 - 99 mg/dL  CBC with Diff     Status: Abnormal   Collection Time: 12/05/23 10:44 AM  Result Value Ref Range   WBC 5.0 3.4 - 10.8 x10E3/uL   RBC 3.55 (L) 3.77 - 5.28 x10E6/uL   Hemoglobin 11.7 11.1 - 15.9 g/dL   Hematocrit 64.0 65.9 - 46.6 %   MCV 101 (H) 79 - 97 fL   MCH 33.0 26.6 - 33.0 pg   MCHC 32.6 31.5 - 35.7 g/dL   RDW 86.5 88.2 - 84.5 %   Platelets 200 150 - 450 x10E3/uL   Neutrophils 69 Not  Estab. %   Lymphs 14 Not Estab. %   Monocytes 10 Not Estab. %   Eos 6 Not Estab. %   Basos 1 Not Estab. %   Neutrophils Absolute 3.5 1.4 - 7.0 x10E3/uL   Lymphocytes Absolute 0.7 0.7 - 3.1 x10E3/uL   Monocytes Absolute 0.5 0.1 - 0.9 x10E3/uL   EOS (ABSOLUTE) 0.3 0.0 - 0.4 x10E3/uL   Basophils Absolute 0.1 0.0 - 0.2 x10E3/uL   Immature Granulocytes 0 Not Estab. %   Immature Grans (Abs) 0.0 0.0 - 0.1 x10E3/uL  TSH+T4F+T3Free     Status: None   Collection Time: 12/05/23 10:44 AM  Result Value Ref Range   TSH 1.970 0.450 - 4.500 uIU/mL   T3, Free 3.0 2.0 - 4.4 pg/mL   Free T4 1.21 0.82 - 1.77 ng/dL      Assessment & Plan:  Take blood pressure medications daily as prescribed Decrease salt intake Tylenol  as needed for headaches Flonase  once daily Doxycycline   Problem List Items Addressed This Visit       Cardiovascular and Mediastinum   Essential hypertension, benign     Respiratory   Acute non-recurrent pansinusitis - Primary   Relevant Medications   doxycycline  (VIBRA -TABS) 100 MG tablet     Genitourinary   Chronic kidney disease     Other   Mixed hyperlipidemia   Class 1 obesity due to excess calories without serious comorbidity with body mass index (BMI) of 34.0 to 34.9 in adult    Return in about 2 weeks (around 03/13/2024) for with NK.   Total time spent: 25 minutes. This time includes review of previous notes and results and patient face to face interaction during today's visit.    Jeoffrey Pollen, NP  02/28/2024   This document may have been prepared by [facility] Voice Recognition software and as such may include unintentional dictation errors.     [1]  Allergies Allergen Reactions   Codeine Itching   Penicillins Other (See Comments)    unknown  [2]  Outpatient Medications Prior to Visit  Medication Sig   acetaminophen  (TYLENOL ) 650 MG CR tablet Take 1,300 mg by mouth daily.   aspirin  81 MG chewable tablet Chew 1 tablet (81 mg total) by mouth daily.    baclofen  (LIORESAL ) 10 MG tablet Take 1 tablet (10 mg total) by mouth daily.   BRILINTA  60 MG TABS tablet Take 1 tablet (60 mg total) by mouth 2 (two) times daily.   celecoxib  (CELEBREX ) 200 MG capsule Take 1 capsule (200 mg total) by mouth daily.   certolizumab pegol  (CIMZIA ) 2 X 200 MG/ML PSKT prefilled syringe Inject into the skin.   DULoxetine  (CYMBALTA ) 30 MG capsule TAKE 1 CAPSULE BY MOUTH EVERY DAY FOR MOOD   fluticasone  (FLONASE ) 50 MCG/ACT nasal spray Place 1 spray into both nostrils daily.   folic acid  (FOLVITE ) 1 MG tablet Take 1  tablet (1 mg total) by mouth daily.   gabapentin (NEURONTIN) 100 MG capsule Take 100 mg by mouth 3 (three) times daily.   hydrochlorothiazide  (MICROZIDE ) 12.5 MG capsule TAKE 1 CAPSULE(12.5 MG) BY MOUTH DAILY   hydroxychloroquine (PLAQUENIL) 200 MG tablet Take by mouth.   levothyroxine  (SYNTHROID ) 50 MCG tablet Take 1 tablet (50 mcg total) by mouth daily.   loratadine  (CLARITIN ) 10 MG tablet Take 1 tablet (10 mg total) by mouth daily.   losartan  (COZAAR ) 25 MG tablet TAKE 1 TABLET(25 MG) BY MOUTH DAILY   meclizine  (ANTIVERT ) 12.5 MG tablet Take 1 tablet (12.5 mg total) by mouth 3 (three) times daily as needed for dizziness.   methotrexate (RHEUMATREX) 2.5 MG tablet Take 15 mg by mouth once a week.   metoprolol  succinate (TOPROL -XL) 100 MG 24 hr tablet Take 1 tablet (100 mg total) by mouth daily.   rosuvastatin  (CRESTOR ) 40 MG tablet Take 1 tablet (40 mg total) by mouth daily.   No facility-administered medications prior to visit.   "

## 2024-02-29 ENCOUNTER — Telehealth: Payer: Self-pay

## 2024-02-29 ENCOUNTER — Other Ambulatory Visit: Payer: Self-pay | Admitting: Internal Medicine

## 2024-02-29 DIAGNOSIS — I2510 Atherosclerotic heart disease of native coronary artery without angina pectoris: Secondary | ICD-10-CM

## 2024-02-29 NOTE — Telephone Encounter (Signed)
 Pt LM asking about her medication PA for brillinta, PA has been done but have not received an approval or denial yet.

## 2024-03-14 ENCOUNTER — Ambulatory Visit: Admitting: Internal Medicine

## 2024-03-19 ENCOUNTER — Ambulatory Visit: Admitting: Cardiovascular Disease

## 2024-04-03 ENCOUNTER — Ambulatory Visit
